# Patient Record
Sex: Male | Born: 1960 | Race: Black or African American | Hispanic: No | Marital: Single | Smoking: Former smoker
Health system: Southern US, Community
[De-identification: ages and names within clinical notes are randomized; demographics above are authoritative.]

## PROBLEM LIST (undated history)

## (undated) ENCOUNTER — Ambulatory Visit

## (undated) DIAGNOSIS — J42 Unspecified chronic bronchitis: Secondary | ICD-10-CM

## (undated) DIAGNOSIS — F32A Depression, unspecified: Secondary | ICD-10-CM

## (undated) DIAGNOSIS — I10 Essential (primary) hypertension: Secondary | ICD-10-CM

## (undated) DIAGNOSIS — K219 Gastro-esophageal reflux disease without esophagitis: Secondary | ICD-10-CM

## (undated) DIAGNOSIS — M1990 Unspecified osteoarthritis, unspecified site: Secondary | ICD-10-CM

## (undated) DIAGNOSIS — R7303 Prediabetes: Secondary | ICD-10-CM

## (undated) DIAGNOSIS — Z9989 Dependence on other enabling machines and devices: Secondary | ICD-10-CM

## (undated) DIAGNOSIS — G4733 Obstructive sleep apnea (adult) (pediatric): Secondary | ICD-10-CM

## (undated) DIAGNOSIS — F419 Anxiety disorder, unspecified: Secondary | ICD-10-CM

## (undated) DIAGNOSIS — F329 Major depressive disorder, single episode, unspecified: Secondary | ICD-10-CM

## (undated) DIAGNOSIS — M109 Gout, unspecified: Secondary | ICD-10-CM

## (undated) DIAGNOSIS — J449 Chronic obstructive pulmonary disease, unspecified: Secondary | ICD-10-CM

## (undated) HISTORY — PX: NO PAST SURGERIES: SHX2092

---

## 2006-03-20 ENCOUNTER — Emergency Department (HOSPITAL_COMMUNITY): Admission: EM | Admit: 2006-03-20 | Discharge: 2006-03-20 | Payer: Self-pay | Admitting: Family Medicine

## 2006-04-07 ENCOUNTER — Ambulatory Visit: Payer: Self-pay | Admitting: *Deleted

## 2006-04-07 ENCOUNTER — Ambulatory Visit: Payer: Self-pay | Admitting: Nurse Practitioner

## 2006-04-23 ENCOUNTER — Ambulatory Visit: Payer: Self-pay | Admitting: Nurse Practitioner

## 2006-05-14 ENCOUNTER — Ambulatory Visit (HOSPITAL_BASED_OUTPATIENT_CLINIC_OR_DEPARTMENT_OTHER): Admission: RE | Admit: 2006-05-14 | Discharge: 2006-05-14 | Payer: Self-pay | Admitting: Nurse Practitioner

## 2006-05-17 ENCOUNTER — Ambulatory Visit: Payer: Self-pay | Admitting: Internal Medicine

## 2006-06-02 ENCOUNTER — Ambulatory Visit: Payer: Self-pay | Admitting: Nurse Practitioner

## 2006-08-14 ENCOUNTER — Ambulatory Visit: Payer: Self-pay | Admitting: Nurse Practitioner

## 2006-08-15 ENCOUNTER — Ambulatory Visit (HOSPITAL_COMMUNITY): Admission: RE | Admit: 2006-08-15 | Discharge: 2006-08-15 | Payer: Self-pay | Admitting: Nurse Practitioner

## 2006-08-25 ENCOUNTER — Ambulatory Visit: Payer: Self-pay | Admitting: Nurse Practitioner

## 2006-10-23 ENCOUNTER — Ambulatory Visit: Payer: Self-pay | Admitting: Nurse Practitioner

## 2006-10-27 ENCOUNTER — Ambulatory Visit: Payer: Self-pay | Admitting: Nurse Practitioner

## 2007-03-25 ENCOUNTER — Encounter (INDEPENDENT_AMBULATORY_CARE_PROVIDER_SITE_OTHER): Payer: Self-pay | Admitting: *Deleted

## 2008-06-15 ENCOUNTER — Emergency Department (HOSPITAL_COMMUNITY): Admission: EM | Admit: 2008-06-15 | Discharge: 2008-06-15 | Payer: Self-pay | Admitting: Emergency Medicine

## 2008-10-18 ENCOUNTER — Ambulatory Visit: Payer: Self-pay | Admitting: Internal Medicine

## 2008-10-19 ENCOUNTER — Encounter: Payer: Self-pay | Admitting: Internal Medicine

## 2008-10-19 ENCOUNTER — Ambulatory Visit: Payer: Self-pay | Admitting: Vascular Surgery

## 2008-10-19 ENCOUNTER — Ambulatory Visit (HOSPITAL_COMMUNITY): Admission: RE | Admit: 2008-10-19 | Discharge: 2008-10-19 | Payer: Self-pay | Admitting: Internal Medicine

## 2008-10-27 ENCOUNTER — Ambulatory Visit: Payer: Self-pay | Admitting: Internal Medicine

## 2008-12-12 ENCOUNTER — Ambulatory Visit: Payer: Self-pay | Admitting: Internal Medicine

## 2009-08-03 ENCOUNTER — Ambulatory Visit: Payer: Self-pay | Admitting: Internal Medicine

## 2009-08-09 ENCOUNTER — Ambulatory Visit: Payer: Self-pay | Admitting: Internal Medicine

## 2009-08-09 ENCOUNTER — Encounter (INDEPENDENT_AMBULATORY_CARE_PROVIDER_SITE_OTHER): Payer: Self-pay | Admitting: Family Medicine

## 2009-08-09 LAB — CONVERTED CEMR LAB
ALT: 30 units/L (ref 0–53)
AST: 22 units/L (ref 0–37)
BUN: 10 mg/dL (ref 6–23)
Basophils Absolute: 0 10*3/uL (ref 0.0–0.1)
Basophils Relative: 0 % (ref 0–1)
Chloride: 102 meq/L (ref 96–112)
Eosinophils Relative: 2 % (ref 0–5)
Glucose, Bld: 97 mg/dL (ref 70–99)
HCT: 45.7 % (ref 39.0–52.0)
LDL Cholesterol: 57 mg/dL (ref 0–99)
Lymphocytes Relative: 23 % (ref 12–46)
MCHC: 32.6 g/dL (ref 30.0–36.0)
MCV: 91 fL (ref 78.0–100.0)
Monocytes Absolute: 0.9 10*3/uL (ref 0.1–1.0)
Monocytes Relative: 11 % (ref 3–12)
Neutro Abs: 5 10*3/uL (ref 1.7–7.7)
Neutrophils Relative %: 64 % (ref 43–77)
Potassium: 4.5 meq/L (ref 3.5–5.3)
RBC: 5.02 M/uL (ref 4.22–5.81)
RDW: 15 % (ref 11.5–15.5)
Total Protein: 7.2 g/dL (ref 6.0–8.3)
Vit D, 25-Hydroxy: 22 ng/mL — ABNORMAL LOW (ref 30–89)

## 2009-09-01 ENCOUNTER — Ambulatory Visit: Payer: Self-pay | Admitting: Internal Medicine

## 2009-09-21 ENCOUNTER — Emergency Department (HOSPITAL_COMMUNITY): Admission: EM | Admit: 2009-09-21 | Discharge: 2009-09-21 | Payer: Self-pay | Admitting: Family Medicine

## 2009-10-19 ENCOUNTER — Ambulatory Visit: Payer: Self-pay | Admitting: Internal Medicine

## 2009-10-19 LAB — CONVERTED CEMR LAB: CRP: 0.3 mg/dL (ref <0.6)

## 2009-11-30 ENCOUNTER — Ambulatory Visit: Payer: Self-pay | Admitting: Internal Medicine

## 2010-07-29 ENCOUNTER — Encounter: Payer: Self-pay | Admitting: Family Medicine

## 2010-11-23 NOTE — Procedures (Signed)
NAME:  Kurt [name], Kurt [name] NO.:  [id number]   MEDICAL RECORD NO.:  [id number]          PATIENT TYPE:  OUT   LOCATION:  SLEEP CENTER                 FACILITY:  [facility]   PHYSICIAN:  Clinton D. Maple Hudson, MD, FCCP, FACPDATE OF BIRTH:  07-21-1961   DATE OF STUDY:  05/14/2006                              NOCTURNAL POLYSOMNOGRAM   REFERRING PHYSICIAN:  Duke Salvia   INDICATIONS FOR STUDY:  Hypersomnia with sleep apnea. Epworth sleepiness  score 22/24, BMI 50, weight 320 pounds.   HOME MEDICATIONS:  Trazodone, Cymbalta, Invega, Naproxen, potassium,  furosemide.   Sleep Architecture:  Total sleep time 446 minutes with sleep efficiency 96%.  Stage I was 3%, stage II 83%, stages III and IV were absent. REM 14% of  total sleep time. Sleep latency 4 minutes, REM latency 287 minutes, awake  after sleep onset 16 minutes, arousable index 61 indicating markedly  increased sleep fragmentation. The patient complained of burning, itching  and pain in back and legs. He took trazodone and Invega at 8:30 p.m.   Respiratory Data:  Split study protocol. Apnea-hypopnea index (AHI, RDI)  165.2 obstructive events per hour indicating severe sleep apnea/hypopnea  syndrome before CPAP. This included 340 obstructive apneas and 11 hypopneas  before CPAP. All sleep and events were while supine. REM AHI 12.5. CPAP was  titrated to 25 CWP, AHI 0 per hour. This will require a bilevel machine for  the pressure. A medium ResMed Quattro full face mask was used with a heated  humidifier.   Oxygen Data:  Very loud snoring with oxygen desaturations to a nadir of 61%  before CPAP control. After CPAP saturation held 85-91% on room air.   Cardiac Data:  Sinus rhythm, occasional PVC and PAC.   Movement/Parasomnia:  Occasional leg jerk, insignificant.   IMPRESSION AND RECOMMENDATIONS:  1. Very severe obstructive sleep apnea/hypopnea syndrome. Apnea-hypopnea      index 165.2 obstructive events per hour with all  sleep and events while      supine. Very loud snoring, oxygen desaturation to 61%.  2. Successful continuous positive airway pressure titration. Required 25      CWP. This will require a bilevel machine for delivery although      continuous positive airway pressure (CPAP) pressure setting was used. A      medium ResMed Quattro full face mask was used with a heated humidifier.      The home care company will      likely need to work with pressure relief technologies and he may need      reduced pressure on exhalation (consider 25/15) to be comfortable.      Clinton D. Maple Hudson, MD, FCCP, FACP  Diplomate, Biomedical engineer of Sleep Medicine  Electronically Signed     CDY/MEDQ  D:  05/17/2006 12:00:11  T:  05/17/2006 15:48:19  Job:  [id number]

## 2011-01-02 ENCOUNTER — Inpatient Hospital Stay (INDEPENDENT_AMBULATORY_CARE_PROVIDER_SITE_OTHER)
Admission: RE | Admit: 2011-01-02 | Discharge: 2011-01-02 | Disposition: A | Discharge status: Home / Self Care | Payer: Self-pay | Source: Ambulatory Visit | Attending: Emergency Medicine | Admitting: Emergency Medicine

## 2011-01-02 DIAGNOSIS — R109 Unspecified abdominal pain: Secondary | ICD-10-CM

## 2011-01-02 LAB — POCT URINALYSIS DIP (DEVICE)
Bilirubin Urine: NEGATIVE
Glucose, UA: NEGATIVE mg/dL
Hgb urine dipstick: NEGATIVE
Nitrite: NEGATIVE
Urobilinogen, UA: 0.2 mg/dL (ref 0.0–1.0)
pH: 6 (ref 5.0–8.0)

## 2011-11-06 ENCOUNTER — Encounter (HOSPITAL_COMMUNITY): Payer: Self-pay | Admitting: *Deleted

## 2011-11-06 ENCOUNTER — Emergency Department (INDEPENDENT_AMBULATORY_CARE_PROVIDER_SITE_OTHER): Payer: Self-pay

## 2011-11-06 ENCOUNTER — Emergency Department (INDEPENDENT_AMBULATORY_CARE_PROVIDER_SITE_OTHER)
Admission: EM | Admit: 2011-11-06 | Discharge: 2011-11-06 | Disposition: A | Discharge status: Home / Self Care | Payer: Self-pay | Source: Home / Self Care | Attending: Emergency Medicine | Admitting: Emergency Medicine

## 2011-11-06 DIAGNOSIS — S6390XA Sprain of unspecified part of unspecified wrist and hand, initial encounter: Secondary | ICD-10-CM

## 2011-11-06 DIAGNOSIS — S63602A Unspecified sprain of left thumb, initial encounter: Secondary | ICD-10-CM

## 2011-11-06 HISTORY — DX: Essential (primary) hypertension: I10

## 2011-11-06 NOTE — ED Provider Notes (Signed)
Chief Complaint  Patient presents with  . Hand Injury    History of Present Illness:   Kurt [name] is a 51 year old male who crushed his left thumb under a table that he was moving a week ago. He has swelling of the thumb and pain over the inter phalangeal joint. He is able to move it fairly well with slight stiffness and pain on movement. There is no numbness or tingling. He denies any bruising or deformity in. Has no pain over the MCP joint or over the hand.  Review of Systems:  Other than noted above, the patient denies any of the following symptoms: Systemic:  No fevers, chills, sweats, or aches.  No fatigue or tiredness. Musculoskeletal:  No joint pain, arthritis, bursitis, swelling, back pain, or neck pain. Neurological:  No muscular weakness, paresthesias, headache, or trouble with speech or coordination.  No dizziness.   PMFSH:  Past medical history, family history, social history, meds, and allergies were reviewed.  Physical Exam:   Vital signs:  BP 162/92  Pulse 92  Temp(Src) 98.6 F (37 C) (Oral)  Resp 18  SpO2 100% Gen:  Alert and oriented times 3.  In no distress. Musculoskeletal: The thumb looks a little bit swollen, but no bruising or deformity. There is pain to palpation over the IP joint. No pain over the distal phalanx, proximal phalanx, or MCP joint. He is able to fully extend and can fully flex although with some pain. Sensation is intact. Otherwise, all joints had a full a ROM with no swelling, bruising or deformity.  No edema, pulses full. Extremities were warm and pink.  Capillary refill was brisk.  Skin:  Clear, warm and dry.  No rash. Neuro:  Alert and oriented times 3.  Muscle strength was normal.  Sensation was intact to light touch.   Radiology:  Dg Finger Thumb Left  11/06/2011  *RADIOLOGY REPORT*  Clinical Data: Pain proximal right thumb, dropped a table onto left thumb 1 week ago  LEFT THUMB 2+V  Comparison: None  Findings: Soft tissue swelling left thumb.  Osseous mineralization normal. Joint spaces preserved. No acute fracture, dislocation, or bone destruction.  IMPRESSION: No acute osseous abnormalities.  Original Report Authenticated By: Lollie Marrow, M.D.   Course in Urgent Care Center:   He was placed in a position of function finger splint and should wear this for the next month, then start doing range of motion exercises and if no better after 2 months return to his primary care physician or to come back here for recheck.  Assessment:  The encounter diagnosis was Sprain of left thumb.  Plan:   1.  The following meds were prescribed:   New Prescriptions   No medications on file   2.  The patient was instructed in symptomatic care, including rest and activity, elevation, application of ice and compression.  Appropriate handouts were given. 3.  The patient was told to return if becoming worse in any way, if no better in 3 or 4 days, and given some red flag symptoms that would indicate earlier return.   4.  The patient was told to follow up with a primary care physician if no better in 2 months.  Reuben Likes, MD 11/06/11 [phone]

## 2011-11-06 NOTE — Discharge Instructions (Signed)
Finger Sprain A finger sprain is a tear in one of the strong, fibrous tissues that connect the bones (ligaments) in your finger. The severity of the sprain depends on how much of the ligament is torn. The tear can be either partial or complete. CAUSES  Often, sprains are a result of a fall or accident. If you extend your hands to catch an object or to protect yourself, the force of the impact causes the fibers of your ligament to stretch too much. This excess tension causes the fibers of your ligament to tear. SYMPTOMS  You may have some loss of motion in your finger. Other symptoms include:  Bruising.   Tenderness.   Swelling.  DIAGNOSIS  In order to diagnose finger sprain, your caregiver will physically examine your finger or thumb to determine how torn the ligament is. Your caregiver may also suggest an X-ray exam of your finger to make sure no bones are broken. TREATMENT  If your ligament is only partially torn, treatment usually involves keeping the finger in a fixed position (immobilization) for a short period. To do this, your caregiver will apply a bandage, cast, or splint to keep your finger from moving until it heals. For a partially torn ligament, the healing process usually takes 2 to 3 weeks. If your ligament is completely torn, you may need surgery to reconnect the ligament to the bone. After surgery a cast or splint will be applied and will need to stay on your finger or thumb for 4 to 6 weeks while your ligament heals. HOME CARE INSTRUCTIONS  Keep your injured finger elevated, when possible, to decrease swelling.   To ease pain and swelling, apply ice to your joint twice a day, for 2 to 3 days:   Put ice in a plastic bag.   Place a towel between your skin and the bag.   Leave the ice on for 15 minutes.   Only take over-the-counter or prescription medicine for pain as directed by your caregiver.   Do not wear rings on your injured finger.   Do not leave your finger  unprotected until pain and stiffness go away (usually 3 to 4 weeks).   Do not allow your cast or splint to get wet. Cover your cast or splint with a plastic bag when you shower or bathe. Do not swim.   Your caregiver may suggest special exercises for you to do during your recovery to prevent or limit permanent stiffness.  SEEK IMMEDIATE MEDICAL CARE IF:  Your cast or splint becomes damaged.   Your pain becomes worse rather than better.  MAKE SURE YOU:  Understand these instructions.   Will watch your condition.   Will get help right away if you are not doing well or get worse.  Document Released: 08/01/2004 Document Revised: 06/13/2011 Document Reviewed: 02/25/2011 [facility] Patient Information 2012 [place], [place].  Wear splint for 1 month, then start range of motion exercise (10 time twice daily).  If no better in 2 months, return for a recheck.

## 2011-11-06 NOTE — ED Notes (Signed)
Pt  Reports  1  Week  Ago  He  Was  Moving a  Table  When  He  Slipped      And  The  Table  Felled  On his  l  Thumb    -  He  Has  Pain / swelling     Present       rom is  Limited

## 2014-01-19 ENCOUNTER — Emergency Department (HOSPITAL_COMMUNITY)
Admission: EM | Admit: 2014-01-19 | Discharge: 2014-01-19 | Disposition: A | Discharge status: Home / Self Care | Payer: Self-pay | Source: Home / Self Care | Attending: Family Medicine | Admitting: Family Medicine

## 2014-01-19 ENCOUNTER — Encounter (HOSPITAL_COMMUNITY): Payer: Self-pay | Admitting: Emergency Medicine

## 2014-01-19 DIAGNOSIS — I10 Essential (primary) hypertension: Secondary | ICD-10-CM

## 2014-01-19 DIAGNOSIS — J029 Acute pharyngitis, unspecified: Secondary | ICD-10-CM

## 2014-01-19 LAB — POCT I-STAT, CHEM 8
BUN: 9 mg/dL (ref 6–23)
CALCIUM ION: 1.14 mmol/L (ref 1.12–1.23)
CHLORIDE: 100 meq/L (ref 96–112)
CREATININE: 1.1 mg/dL (ref 0.50–1.35)
Glucose, Bld: 93 mg/dL (ref 70–99)
HCT: 56 % — ABNORMAL HIGH (ref 39.0–52.0)
HEMOGLOBIN: 19 g/dL — AB (ref 13.0–17.0)
Potassium: 4.2 mEq/L (ref 3.7–5.3)
Sodium: 140 mEq/L (ref 137–147)
TCO2: 31 mmol/L (ref 0–100)

## 2014-01-19 LAB — POCT RAPID STREP A: STREPTOCOCCUS, GROUP A SCREEN (DIRECT): NEGATIVE

## 2014-01-19 MED ORDER — LISINOPRIL-HYDROCHLOROTHIAZIDE 10-12.5 MG PO TABS: 1.0000 | ORAL_TABLET | Freq: Every day | ORAL | Status: DC

## 2014-01-19 MED ORDER — PREDNISONE 10 MG PO TABS: 30.0000 mg | ORAL_TABLET | Freq: Every day | ORAL | Status: DC

## 2014-01-19 NOTE — Discharge Instructions (Signed)
Thank you for coming in today. Use prednisone daily for sore throat.  Restart hypertension.  Call or go to the emergency room if you get worse, have trouble breathing, have chest pains, or palpitations.    [facility], [facility]iatric Psychiatry and   [address] [place], [place] [address] [phone]  [facility] [address] [place], [place] [address] [phone]  Please call or see Ms Antionette CharMaggy Mena for assistance with your bill.  You may qualify for reduced or free services.  Her phone number is [phone]. Her email is [email or web address]  Pharyngitis Pharyngitis is redness, pain, and swelling (inflammation) of your pharynx.  CAUSES  Pharyngitis is usually caused by infection. Most of the time, these infections are from viruses (viral) and are part of a cold. However, sometimes pharyngitis is caused by bacteria (bacterial). Pharyngitis can also be caused by allergies. Viral pharyngitis may be spread from person to person by coughing, sneezing, and personal items or utensils (cups, forks, spoons, toothbrushes). Bacterial pharyngitis may be spread from person to person by more intimate contact, such as kissing.  SIGNS AND SYMPTOMS  Symptoms of pharyngitis include:   Sore throat.   Tiredness (fatigue).   Low-grade fever.   Headache.  Joint pain and muscle aches.  Skin rashes.  Swollen lymph nodes.  Plaque-like film on throat or tonsils (often seen with bacterial pharyngitis). DIAGNOSIS  Your health care provider will ask you questions about your illness and your symptoms. Your medical history, along with a physical exam, is often all that is needed to diagnose pharyngitis. Sometimes, a rapid strep test is done. Other lab tests may also be done, depending on the suspected cause.  TREATMENT  Viral pharyngitis will usually get better in 3-4 days without the use of medicine. Bacterial  pharyngitis is treated with medicines that kill germs (antibiotics).  HOME CARE INSTRUCTIONS   Drink enough water and fluids to keep your urine clear or pale yellow.   Only take over-the-counter or prescription medicines as directed by your health care provider:   If you are prescribed antibiotics, make sure you finish them even if you start to feel better.   Do not take aspirin.   Get lots of rest.   Gargle with 8 oz of salt water ( tsp of salt per 1 qt of water) as often as every 1-2 hours to soothe your throat.   Throat lozenges (if you are not at risk for choking) or sprays may be used to soothe your throat. SEEK MEDICAL CARE IF:   You have large, tender lumps in your neck.  You have a rash.  You cough up green, yellow-brown, or bloody spit. SEEK IMMEDIATE MEDICAL CARE IF:   Your neck becomes stiff.  You drool or are unable to swallow liquids.  You vomit or are unable to keep medicines or liquids down.  You have severe pain that does not go away with the use of recommended medicines.  You have trouble breathing (not caused by a stuffy nose). MAKE SURE YOU:   Understand these instructions.  Will watch your condition.  Will get help right away if you are not doing well or get worse. Document Released: 06/24/2005 Document Revised: 04/14/2013 Document Reviewed: 03/01/2013 [facility] Patient Information 2015 [place]. This information is not intended to replace advice given to you by your health care provider. Make sure you discuss any questions you have with your health care provider.  Hypertension Hypertension, commonly called  high blood pressure, is when the force of blood pumping through your arteries is too strong. Your arteries are the blood vessels that carry blood from your heart throughout your body. A blood pressure reading consists of a higher number over a lower number, such as 110/72. The higher number (systolic) is the pressure inside your  arteries when your heart pumps. The lower number (diastolic) is the pressure inside your arteries when your heart relaxes. Ideally you want your blood pressure below 120/80. Hypertension forces your heart to work harder to pump blood. Your arteries may become narrow or stiff. Having hypertension puts you at risk for heart disease, stroke, and other problems.  RISK FACTORS Some risk factors for high blood pressure are controllable. Others are not.  Risk factors you cannot control include:   Race. You may be at higher risk if you are African American.  Age. Risk increases with age.  Gender. Men are at higher risk than women before age 69 years. After age 55, women are at higher risk than men. Risk factors you can control include:  Not getting enough exercise or physical activity.  Being overweight.  Getting too much fat, sugar, calories, or salt in your diet.  Drinking too much alcohol. SIGNS AND SYMPTOMS Hypertension does not usually cause signs or symptoms. Extremely high blood pressure (hypertensive crisis) may cause headache, anxiety, shortness of breath, and nosebleed. DIAGNOSIS  To check if you have hypertension, your health care provider will measure your blood pressure while you are seated, with your arm held at the level of your heart. It should be measured at least twice using the same arm. Certain conditions can cause a difference in blood pressure between your right and left arms. A blood pressure reading that is higher than normal on one occasion does not mean that you need treatment. If one blood pressure reading is high, ask your health care provider about having it checked again. TREATMENT  Treating high blood pressure includes making lifestyle changes and possibly taking medication. Living a healthy lifestyle can help lower high blood pressure. You may need to change some of your habits. Lifestyle changes may include:  Following the DASH diet. This diet is high in fruits,  vegetables, and whole grains. It is low in salt, red meat, and added sugars.  Getting at least 2 1/2 hours of brisk physical activity every week.  Losing weight if necessary.  Not smoking.  Limiting alcoholic beverages.  Learning ways to reduce stress. If lifestyle changes are not enough to get your blood pressure under control, your health care provider may prescribe medicine. You may need to take more than one. Work closely with your health care provider to understand the risks and benefits. HOME CARE INSTRUCTIONS  Have your blood pressure rechecked as directed by your health care provider.   Only take medicine as directed by your health care provider. Follow the directions carefully. Blood pressure medicines must be taken as prescribed. The medicine does not work as well when you skip doses. Skipping doses also puts you at risk for problems.   Do not smoke.   Monitor your blood pressure at home as directed by your health care provider. SEEK MEDICAL CARE IF:   You think you are having a reaction to medicines taken.  You have recurrent headaches or feel dizzy.  You have swelling in your ankles.  You have trouble with your vision. SEEK IMMEDIATE MEDICAL CARE IF:  You develop a severe headache or confusion.  You  have unusual weakness, numbness, or feel faint.  You have severe chest or abdominal pain.  You vomit repeatedly.  You have trouble breathing. MAKE SURE YOU:   Understand these instructions.  Will watch your condition.  Will get help right away if you are not doing well or get worse. Document Released: 06/24/2005 Document Revised: 06/29/2013 Document Reviewed: 04/16/2013 [facility] Patient Information 2015 [place], [place]. This information is not intended to replace advice given to you by your health care provider. Make sure you discuss any questions you have with your health care provider.

## 2014-01-19 NOTE — ED Notes (Signed)
C/o sore throat and left ear discomfort States he has a hx of having ear infections when he was younger States ear is discomfort pain otc meds was taking as tx

## 2014-01-19 NOTE — ED Provider Notes (Signed)
Kurt [name] is a 53 y.o. male who presents to Urgent Care today for sore throat and left ear pain. Symptoms have been present for about one week. The pain is worse with swallowing and is mild. He additionally notes a runny nose and cough. He denies any significant fevers chills nausea vomiting diarrhea or shortness of breath. History aspirin and Alka-Seltzer which helped a little.  Patient is without his blood pressure medication for several months. He does not have a doctor or health insurance. He was previously taking lisinopril hydrochlorothiazide combination pill. He does have a history of sleep apnea and is using his CPAP. No chest pain or palpitations.   Past Medical History  Diagnosis Date  . Hypertension    History  Substance Use Topics  . Smoking status: Never Smoker   . Smokeless tobacco: Not on file  . Alcohol Use: No   ROS as above Medications: No current facility-administered medications for this encounter.   Current Outpatient Prescriptions  Medication Sig Dispense Refill  . lisinopril-hydrochlorothiazide (PRINZIDE,ZESTORETIC) 10-12.5 MG per tablet Take 1 tablet by mouth daily.  30 tablet  1  . predniSONE (DELTASONE) 10 MG tablet Take 3 tablets (30 mg total) by mouth daily.  15 tablet  0    Exam:  BP 165/100  Pulse 88  Temp(Src) 98.5 F (36.9 C) (Oral)  Resp 16  SpO2 97% Gen: Well NAD morbidly obese HEENT: EOMI,  MMM unable to visualize posterior pharynx due to body habitus. Tympanic membranes are normal appearing bilaterally. No significant cervical lymphadenopathy. Lungs: Normal work of breathing. CTABL Heart: RRR no MRG Abd: NABS, Soft. NT, ND Exts: Brisk capillary refill, warm and well perfused.   Results for orders placed during the hospital encounter of 01/19/14 (from the past 24 hour(s))  POCT RAPID STREP A ([facility]RG CARE ONLY)     Status: None   Collection Time    01/19/14 12:27 PM      Result Value Ref Range   Streptococcus, Group A Screen (Direct)  NEGATIVE  NEGATIVE  POCT I-STAT, CHEM 8     Status: Abnormal   Collection Time    01/19/14 12:29 PM      Result Value Ref Range   Sodium 140  137 - 147 mEq/L   Potassium 4.2  3.7 - 5.3 mEq/L   Chloride 100  96 - 112 mEq/L   BUN 9  6 - 23 mg/dL   Creatinine, Ser 7.821.10  0.50 - 1.35 mg/dL   Glucose, Bld 93  70 - 99 mg/dL   Calcium, Ion 9.561.14  2.131.12 - 1.23 mmol/L   TCO2 31  0 - 100 mmol/L   Hemoglobin 19.0 (*) 13.0 - 17.0 g/dL   HCT 08.656.0 (*) 57.839.0 - 46.952.0 %   No results found.  Assessment and Plan: 53 y.o. male with  1) sore throat and ear pain: Viral pharyngitis due to upper respiratory virus. Plan to treat with prednisone as patient is moderately symptomatic. Will use low-dose and short duration.  2) hypertension: Refill lisinopril hydrochlorothiazide  Discussed warning signs or symptoms. Please see discharge instructions. Patient expresses understanding.    Rodolph BongEvan S Danna Sewell, MD 01/19/14 [phone]

## 2014-01-21 LAB — CULTURE, GROUP A STREP

## 2014-01-26 ENCOUNTER — Encounter (HOSPITAL_COMMUNITY): Payer: Self-pay | Admitting: Emergency Medicine

## 2014-01-26 ENCOUNTER — Emergency Department (INDEPENDENT_AMBULATORY_CARE_PROVIDER_SITE_OTHER)
Admission: EM | Admit: 2014-01-26 | Discharge: 2014-01-26 | Disposition: A | Discharge status: Home / Self Care | Payer: Self-pay | Source: Home / Self Care | Attending: Family Medicine | Admitting: Family Medicine

## 2014-01-26 DIAGNOSIS — H6980 Other specified disorders of Eustachian tube, unspecified ear: Secondary | ICD-10-CM

## 2014-01-26 DIAGNOSIS — H6982 Other specified disorders of Eustachian tube, left ear: Secondary | ICD-10-CM

## 2014-01-26 MED ORDER — IPRATROPIUM BROMIDE 0.06 % NA SOLN: 2.0000 | Freq: Four times a day (QID) | NASAL | Status: DC

## 2014-01-26 MED ORDER — CETIRIZINE HCL 10 MG PO TABS: 10.0000 mg | ORAL_TABLET | Freq: Every day | ORAL | Status: DC

## 2014-01-26 NOTE — Discharge Instructions (Signed)
Drink plenty of fluids as discussed, use medicine as prescribed, and mucinex or delsym for cough. Return or see your doctor if further problems °

## 2014-01-26 NOTE — ED Provider Notes (Signed)
CSN: [id number]     Arrival date & time 01/26/14  1610 History   First MD Initiated Contact with Patient 01/26/14 1704     Chief Complaint  Patient presents with  . Otalgia   (Consider location/radiation/quality/duration/timing/severity/associated sxs/prior Treatment) Patient is a 53 y.o. male presenting with ear pain. The history is provided by the patient.  Otalgia Location:  Left Behind ear:  No abnormality Quality:  Dull Severity:  Mild Onset quality:  Gradual Duration:  6 days Chronicity:  New Context comment:  Seen 7/15 with viral uri , left ear cong continues. Associated symptoms: congestion and rhinorrhea   Associated symptoms: no ear discharge, no sore throat and no tinnitus     Past Medical History  Diagnosis Date  . Hypertension    History reviewed. No pertinent past surgical history. History reviewed. No pertinent family history. History  Substance Use Topics  . Smoking status: Never Smoker   . Smokeless tobacco: Not on file  . Alcohol Use: No    Review of Systems  Constitutional: Negative.   HENT: Positive for congestion, ear pain and rhinorrhea. Negative for ear discharge, postnasal drip, sore throat and tinnitus.   Eyes: Negative.   Respiratory: Negative.     Allergies  Penicillins  Home Medications   Prior to Admission medications   Medication Sig Start Date End Date Taking? Authorizing Provider  cetirizine (ZYRTEC) 10 MG tablet Take 1 tablet (10 mg total) by mouth daily. 01/26/14   Linna HoffJames D Nyliah Nierenberg, MD  ipratropium (ATROVENT) 0.06 % nasal spray Place 2 sprays into both nostrils 4 (four) times daily. 01/26/14   Linna HoffJames D Dwana Garin, MD  lisinopril-hydrochlorothiazide (PRINZIDE,ZESTORETIC) 10-12.5 MG per tablet Take 1 tablet by mouth daily. 01/19/14   Rodolph BongEvan S Corey, MD  predniSONE (DELTASONE) 10 MG tablet Take 3 tablets (30 mg total) by mouth daily. 01/19/14   Rodolph BongEvan S Corey, MD   BP 145/95  Pulse 70  Temp(Src) 98.2 F (36.8 C) (Oral)  Resp 14  SpO2  100% Physical Exam  Nursing note and vitals reviewed. HENT:  Right Ear: Hearing, tympanic membrane, external ear and ear canal normal.  Left Ear: Hearing, external ear and ear canal normal. No drainage or tenderness. Tympanic membrane is injected and retracted.  No middle ear effusion.  Mouth/Throat: Oropharynx is clear and moist.  Eyes: Conjunctivae are normal. Pupils are equal, round, and reactive to light.  Neck: Normal range of motion. Neck supple.  Pulmonary/Chest: Effort normal and breath sounds normal.    ED Course  Procedures (including critical care time) Labs Review Labs Reviewed - No data to display  Imaging Review No results found.   MDM   1. Eustachian tube dysfunction, left        Linna HoffJames D Landra Howze, MD 01/26/14 [phone]

## 2014-01-26 NOTE — ED Notes (Signed)
Pt  Reports  l  Earache  For  sev  Weeks         Pt  Was  Seen  1  Week  Ago  For      Throat  Infection     Pt  Was  Slightly  Congested           Throat       Is  Better      But  l  Ear  Has  A  Discomfort

## 2017-06-08 ENCOUNTER — Encounter (HOSPITAL_COMMUNITY): Payer: Self-pay | Admitting: *Deleted

## 2017-06-08 ENCOUNTER — Other Ambulatory Visit: Payer: Self-pay

## 2017-06-08 ENCOUNTER — Ambulatory Visit (HOSPITAL_COMMUNITY)
Admission: EM | Admit: 2017-06-08 | Discharge: 2017-06-08 | Disposition: A | Discharge status: Home / Self Care | Payer: Medicaid Other

## 2017-06-08 DIAGNOSIS — J019 Acute sinusitis, unspecified: Secondary | ICD-10-CM

## 2017-06-08 DIAGNOSIS — Z760 Encounter for issue of repeat prescription: Secondary | ICD-10-CM

## 2017-06-08 MED ORDER — AZITHROMYCIN 250 MG PO TABS: 250.0000 mg | ORAL_TABLET | Freq: Every day | ORAL | 0 refills | Status: DC

## 2017-06-08 MED ORDER — LISINOPRIL-HYDROCHLOROTHIAZIDE 10-12.5 MG PO TABS: 1.0000 | ORAL_TABLET | Freq: Every day | ORAL | 1 refills | Status: DC

## 2017-06-08 MED ORDER — PREDNISONE 10 MG PO TABS: 40.0000 mg | ORAL_TABLET | Freq: Every day | ORAL | 0 refills | Status: AC

## 2017-06-08 NOTE — ED Notes (Addendum)
Patient has an appt with provider this Wednesday..  Patient is out of medication.  Recently received medicaid.  Last medicines were on [facility] from [facility]

## 2017-06-08 NOTE — ED Provider Notes (Signed)
[facility]    CSN: [id number] Arrival date & time: 06/08/17  1357     History   Chief Complaint Chief Complaint  Patient presents with  . Facial Pain    HPI Orlena Sheldonlton Chio is a 56 y.o. male.   56 year-old male, presenting today due to nasal congestion He states that he has had green nasal congestion and facial pressure for the past month or so. He states he is here today because his daughter made him come.  Daughter also mentions that the patient had a few seconds of chest pain 3 days ago. He states that he burped a few times and the chest pain subsided. He states that the pain was secondary to gas. No pain currently.  He is also requesting refill of his lisinopril   The history is provided by the patient.  URI  Presenting symptoms: congestion and facial pain   Presenting symptoms: no cough, no ear pain, no fever and no sore throat   Severity:  Moderate Onset quality:  Gradual Duration:  1 month Timing:  Constant Progression:  Unchanged Chronicity:  New Relieved by:  Nothing Worsened by:  Nothing Ineffective treatments:  None tried Associated symptoms: sinus pain   Associated symptoms: no arthralgias, no headaches, no myalgias, no neck pain, no sneezing, no swollen glands and no wheezing   Risk factors: not elderly, no chronic cardiac disease, no chronic kidney disease, no chronic respiratory disease and no diabetes mellitus     Past Medical History:  Diagnosis Date  . Hypertension     There are no active problems to display for this patient.   History reviewed. No pertinent surgical history.     Home Medications    Prior to Admission medications   Medication Sig Start Date End Date Taking? Authorizing Provider  aspirin 325 MG tablet Take 325 mg by mouth daily.   Yes [provider]  azithromycin (ZITHROMAX) 250 MG tablet Take 1 tablet (250 mg total) by mouth daily. Take first 2 tablets together, then 1 every day until finished. 06/08/17    Cordell Guercio C, PA-C  cetirizine (ZYRTEC) 10 MG tablet Take 1 tablet (10 mg total) by mouth daily. 01/26/14   Linna HoffKindl, James D, MD  ipratropium (ATROVENT) 0.06 % nasal spray Place 2 sprays into both nostrils 4 (four) times daily. 01/26/14   Linna HoffKindl, James D, MD  lisinopril-hydrochlorothiazide (PRINZIDE,ZESTORETIC) 10-12.5 MG tablet Take 1 tablet by mouth daily. 06/08/17   Ermina Oberman C, PA-C  predniSONE (DELTASONE) 10 MG tablet Take 4 tablets (40 mg total) by mouth daily for 5 days. 06/08/17 06/13/17  Alecia LemmingBlue, Norissa Bartee C, PA-C    Family History Family History  Problem Relation Age of Onset  . Hypertension Mother     Social History Social History   Tobacco Use  . Smoking status: Never Smoker  . Smokeless tobacco: Never Used  Substance Use Topics  . Alcohol use: No  . Drug use: Not on file     Allergies   Penicillins   Review of Systems Review of Systems  Constitutional: Negative for chills and fever.  HENT: Positive for congestion and sinus pain. Negative for ear pain, sneezing and sore throat.   Eyes: Negative for pain and visual disturbance.  Respiratory: Negative for cough, shortness of breath and wheezing.   Cardiovascular: Positive for chest pain (3 days ago, none currently ). Negative for palpitations.  Gastrointestinal: Negative for abdominal pain and vomiting.  Genitourinary: Negative for dysuria and hematuria.  Musculoskeletal:  Negative for arthralgias, back pain, myalgias and neck pain.  Skin: Negative for color change and rash.  Neurological: Negative for seizures, syncope and headaches.  All other systems reviewed and are negative.    Physical Exam Triage Vital Signs ED Triage Vitals  Enc Vitals Group     BP 06/08/17 1540 132/75     Pulse Rate 06/08/17 1540 97     Resp --      Temp 06/08/17 1540 98.2 F (36.8 C)     Temp Source 06/08/17 1540 Oral     SpO2 06/08/17 1540 100 %     Weight --      Height --      Head Circumference --      Peak Flow --      Pain  Score 06/08/17 1536 5     Pain Loc --      Pain Edu? --      Excl. in GC? --    No data found.  Updated Vital Signs BP 132/75 (BP Location: Left Arm)   Pulse 97   Temp 98.2 F (36.8 C) (Oral)   SpO2 100%   Visual Acuity Right Eye Distance:   Left Eye Distance:   Bilateral Distance:    Right Eye Near:   Left Eye Near:    Bilateral Near:     Physical Exam  Constitutional: He appears well-developed and well-nourished.  HENT:  Head: Normocephalic and atraumatic.  Right Ear: Hearing, tympanic membrane, external ear and ear canal normal.  Left Ear: Hearing, tympanic membrane, external ear and ear canal normal.  Nose: Right sinus exhibits maxillary sinus tenderness. Left sinus exhibits maxillary sinus tenderness.  Mouth/Throat: Uvula is midline and oropharynx is clear and moist. No oropharyngeal exudate, posterior oropharyngeal edema, posterior oropharyngeal erythema or tonsillar abscesses.  Eyes: Conjunctivae are normal.  Neck: Neck supple.  Cardiovascular: Normal rate and regular rhythm.  No murmur heard. Pulmonary/Chest: Effort normal and breath sounds normal. No stridor. No respiratory distress. He has no decreased breath sounds. He has no wheezes. He has no rhonchi. He has no rales.  Abdominal: Soft. There is no tenderness.  Musculoskeletal: He exhibits no edema.  Neurological: He is alert.  Skin: Skin is warm and dry.  Psychiatric: He has a normal mood and affect.  Nursing note and vitals reviewed.    UC Treatments / Results  Labs (all labs ordered are listed, but only abnormal results are displayed) Labs Reviewed - No data to display  EKG  EKG Interpretation None       Radiology No results found.  Procedures Procedures (including critical care time)  Medications Ordered in UC Medications - No data to display   Initial Impression / Assessment and Plan / UC Course  I have reviewed the triage vital signs and the nursing notes.  Pertinent labs &  imaging results that were available during my care of the patient were reviewed by me and considered in my medical decision making (see chart for details).     Acute sinusitis - PCN allergy, will treat with zpak and prednisone Med refill of lisinopril Few seconds of chest pain 3 days ago, none since - EKG shows NSR. No old EKGs to compare   Final Clinical Impressions(s) / UC Diagnoses   Final diagnoses:  Acute sinusitis, recurrence not specified, unspecified location  Medication refill    ED Discharge Orders        Ordered    lisinopril-hydrochlorothiazide (PRINZIDE,ZESTORETIC) 10-12.5 MG tablet  Daily  06/08/17 1603    azithromycin (ZITHROMAX) 250 MG tablet  Daily     06/08/17 1603    predniSONE (DELTASONE) 10 MG tablet  Daily     06/08/17 1603       Controlled Substance Prescriptions [place] Controlled Substance Registry consulted? Not Applicable   Alecia LemmingBlue, Jeimy Bickert C, [place]PA-C 06/08/17 1644

## 2017-06-08 NOTE — ED Triage Notes (Signed)
Per pt he's been having sinus congestion issues, sob, per pt mucus is green, per pt he's having a "little" chest pain,

## 2017-06-20 ENCOUNTER — Encounter (HOSPITAL_COMMUNITY): Payer: Self-pay

## 2017-06-20 ENCOUNTER — Other Ambulatory Visit: Payer: Self-pay

## 2017-06-20 ENCOUNTER — Inpatient Hospital Stay (HOSPITAL_COMMUNITY)
Admission: EM | Admit: 2017-06-20 | Discharge: 2017-06-22 | DRG: 190 | Disposition: A | Discharge status: Home / Self Care | Payer: Medicaid Other | Attending: Nephrology | Admitting: Nephrology

## 2017-06-20 ENCOUNTER — Emergency Department (HOSPITAL_COMMUNITY): Payer: Medicaid Other

## 2017-06-20 ENCOUNTER — Encounter (HOSPITAL_COMMUNITY): Payer: Self-pay | Admitting: Emergency Medicine

## 2017-06-20 ENCOUNTER — Ambulatory Visit (HOSPITAL_COMMUNITY)
Admission: EM | Admit: 2017-06-20 | Discharge: 2017-06-20 | Disposition: A | Discharge status: Home / Self Care | Payer: Medicaid Other | Source: Home / Self Care

## 2017-06-20 DIAGNOSIS — J9601 Acute respiratory failure with hypoxia: Secondary | ICD-10-CM

## 2017-06-20 DIAGNOSIS — Z7982 Long term (current) use of aspirin: Secondary | ICD-10-CM

## 2017-06-20 DIAGNOSIS — E119 Type 2 diabetes mellitus without complications: Secondary | ICD-10-CM | POA: Diagnosis not present

## 2017-06-20 DIAGNOSIS — H109 Unspecified conjunctivitis: Secondary | ICD-10-CM | POA: Diagnosis present

## 2017-06-20 DIAGNOSIS — Z880 Allergy status to penicillin: Secondary | ICD-10-CM

## 2017-06-20 DIAGNOSIS — I10 Essential (primary) hypertension: Secondary | ICD-10-CM | POA: Diagnosis not present

## 2017-06-20 DIAGNOSIS — R600 Localized edema: Secondary | ICD-10-CM | POA: Diagnosis present

## 2017-06-20 DIAGNOSIS — H5789 Other specified disorders of eye and adnexa: Secondary | ICD-10-CM

## 2017-06-20 DIAGNOSIS — J9621 Acute and chronic respiratory failure with hypoxia: Secondary | ICD-10-CM | POA: Diagnosis present

## 2017-06-20 DIAGNOSIS — Z79899 Other long term (current) drug therapy: Secondary | ICD-10-CM

## 2017-06-20 DIAGNOSIS — Z8249 Family history of ischemic heart disease and other diseases of the circulatory system: Secondary | ICD-10-CM

## 2017-06-20 DIAGNOSIS — M1990 Unspecified osteoarthritis, unspecified site: Secondary | ICD-10-CM | POA: Diagnosis present

## 2017-06-20 DIAGNOSIS — Z6841 Body Mass Index (BMI) 40.0 and over, adult: Secondary | ICD-10-CM

## 2017-06-20 DIAGNOSIS — J441 Chronic obstructive pulmonary disease with (acute) exacerbation: Principal | ICD-10-CM | POA: Diagnosis present

## 2017-06-20 DIAGNOSIS — G4733 Obstructive sleep apnea (adult) (pediatric): Secondary | ICD-10-CM | POA: Diagnosis present

## 2017-06-20 DIAGNOSIS — M109 Gout, unspecified: Secondary | ICD-10-CM | POA: Diagnosis present

## 2017-06-20 DIAGNOSIS — G4730 Sleep apnea, unspecified: Secondary | ICD-10-CM | POA: Diagnosis not present

## 2017-06-20 DIAGNOSIS — K219 Gastro-esophageal reflux disease without esophagitis: Secondary | ICD-10-CM | POA: Diagnosis present

## 2017-06-20 DIAGNOSIS — Z9989 Dependence on other enabling machines and devices: Secondary | ICD-10-CM

## 2017-06-20 DIAGNOSIS — Z87891 Personal history of nicotine dependence: Secondary | ICD-10-CM

## 2017-06-20 HISTORY — DX: Gout, unspecified: M10.9

## 2017-06-20 HISTORY — DX: Depression, unspecified: F32.A

## 2017-06-20 HISTORY — DX: Prediabetes: R73.03

## 2017-06-20 HISTORY — DX: Obstructive sleep apnea (adult) (pediatric): G47.33

## 2017-06-20 HISTORY — DX: Gastro-esophageal reflux disease without esophagitis: K21.9

## 2017-06-20 HISTORY — DX: Dependence on other enabling machines and devices: Z99.89

## 2017-06-20 HISTORY — DX: Anxiety disorder, unspecified: F41.9

## 2017-06-20 HISTORY — DX: Unspecified chronic bronchitis: J42

## 2017-06-20 HISTORY — DX: Major depressive disorder, single episode, unspecified: F32.9

## 2017-06-20 HISTORY — DX: Unspecified osteoarthritis, unspecified site: M19.90

## 2017-06-20 HISTORY — DX: Chronic obstructive pulmonary disease, unspecified: J44.9

## 2017-06-20 LAB — BRAIN NATRIURETIC PEPTIDE: B NATRIURETIC PEPTIDE 5: 43.1 pg/mL (ref 0.0–100.0)

## 2017-06-20 LAB — CBC WITH DIFFERENTIAL/PLATELET
BASOS ABS: 0 10*3/uL (ref 0.0–0.1)
BASOS PCT: 0 %
Eosinophils Absolute: 0.2 10*3/uL (ref 0.0–0.7)
Eosinophils Relative: 2 %
HEMATOCRIT: 57.8 % — AB (ref 39.0–52.0)
Hemoglobin: 17.4 g/dL — ABNORMAL HIGH (ref 13.0–17.0)
Lymphocytes Relative: 21 %
Lymphs Abs: 1.8 10*3/uL (ref 0.7–4.0)
MCH: 27.9 pg (ref 26.0–34.0)
MCHC: 30.1 g/dL (ref 30.0–36.0)
MCV: 92.6 fL (ref 78.0–100.0)
MONO ABS: 1.1 10*3/uL — AB (ref 0.1–1.0)
Monocytes Relative: 13 %
NEUTROS ABS: 5.6 10*3/uL (ref 1.7–7.7)
Neutrophils Relative %: 64 %
PLATELETS: 174 10*3/uL (ref 150–400)
RBC: 6.24 MIL/uL — ABNORMAL HIGH (ref 4.22–5.81)
RDW: 16.5 % — AB (ref 11.5–15.5)
WBC: 8.7 10*3/uL (ref 4.0–10.5)

## 2017-06-20 LAB — BASIC METABOLIC PANEL
ANION GAP: 8 (ref 5–15)
BUN: 10 mg/dL (ref 6–20)
CALCIUM: 8.7 mg/dL — AB (ref 8.9–10.3)
CO2: 36 mmol/L — ABNORMAL HIGH (ref 22–32)
Chloride: 93 mmol/L — ABNORMAL LOW (ref 101–111)
Creatinine, Ser: 1.1 mg/dL (ref 0.61–1.24)
GLUCOSE: 75 mg/dL (ref 65–99)
Potassium: 4.5 mmol/L (ref 3.5–5.1)
SODIUM: 137 mmol/L (ref 135–145)

## 2017-06-20 LAB — I-STAT TROPONIN, ED: Troponin i, poc: 0 ng/mL (ref 0.00–0.08)

## 2017-06-20 LAB — GLUCOSE, CAPILLARY: Glucose-Capillary: 194 mg/dL — ABNORMAL HIGH (ref 65–99)

## 2017-06-20 MED ORDER — LISINOPRIL 10 MG PO TABS
10.0000 mg | ORAL_TABLET | Freq: Every day | ORAL | Status: DC
  Administered 2017-06-21 – 2017-06-22 (×2): 10 mg via ORAL
  Filled 2017-06-20 (×2): qty 1

## 2017-06-20 MED ORDER — FUROSEMIDE 10 MG/ML IJ SOLN
40.0000 mg | Freq: Once | INTRAMUSCULAR | Status: AC
  Administered 2017-06-20: 40 mg via INTRAVENOUS
  Filled 2017-06-20: qty 4

## 2017-06-20 MED ORDER — AZITHROMYCIN 250 MG PO TABS
250.0000 mg | ORAL_TABLET | Freq: Every day | ORAL | Status: DC
  Administered 2017-06-21 – 2017-06-22 (×2): 250 mg via ORAL
  Filled 2017-06-20 (×2): qty 1

## 2017-06-20 MED ORDER — FUROSEMIDE 40 MG PO TABS
40.0000 mg | ORAL_TABLET | Freq: Every day | ORAL | Status: DC
  Administered 2017-06-21 – 2017-06-22 (×2): 40 mg via ORAL
  Filled 2017-06-20 (×2): qty 1

## 2017-06-20 MED ORDER — METHYLPREDNISOLONE SODIUM SUCC 125 MG IJ SOLR
125.0000 mg | Freq: Once | INTRAMUSCULAR | Status: AC
  Administered 2017-06-20: 125 mg via INTRAVENOUS
  Filled 2017-06-20: qty 2

## 2017-06-20 MED ORDER — ACETAMINOPHEN 325 MG PO TABS: 650.0000 mg | ORAL_TABLET | Freq: Four times a day (QID) | ORAL | Status: DC | PRN

## 2017-06-20 MED ORDER — HYDRALAZINE HCL 20 MG/ML IJ SOLN: 5.0000 mg | INTRAMUSCULAR | Status: DC | PRN

## 2017-06-20 MED ORDER — FUROSEMIDE 20 MG PO TABS: 40.0000 mg | ORAL_TABLET | Freq: Every day | ORAL | Status: DC

## 2017-06-20 MED ORDER — NAPHAZOLINE-PHENIRAMINE 0.025-0.3 % OP SOLN
1.0000 [drp] | Freq: Four times a day (QID) | OPHTHALMIC | Status: DC | PRN
  Filled 2017-06-20: qty 5

## 2017-06-20 MED ORDER — METHYLPREDNISOLONE SODIUM SUCC 125 MG IJ SOLR
60.0000 mg | Freq: Two times a day (BID) | INTRAMUSCULAR | Status: DC
  Administered 2017-06-21 – 2017-06-22 (×3): 60 mg via INTRAVENOUS
  Filled 2017-06-20 (×3): qty 2

## 2017-06-20 MED ORDER — ASPIRIN EC 325 MG PO TBEC
325.0000 mg | DELAYED_RELEASE_TABLET | Freq: Every day | ORAL | Status: DC
  Administered 2017-06-21 – 2017-06-22 (×3): 325 mg via ORAL
  Filled 2017-06-20 (×3): qty 1

## 2017-06-20 MED ORDER — DM-GUAIFENESIN ER 30-600 MG PO TB12: 1.0000 | ORAL_TABLET | Freq: Two times a day (BID) | ORAL | Status: DC | PRN

## 2017-06-20 MED ORDER — LISINOPRIL-HYDROCHLOROTHIAZIDE 10-12.5 MG PO TABS: 1.0000 | ORAL_TABLET | Freq: Every day | ORAL | Status: DC

## 2017-06-20 MED ORDER — ENOXAPARIN SODIUM 40 MG/0.4ML ~~LOC~~ SOLN
40.0000 mg | SUBCUTANEOUS | Status: DC
  Administered 2017-06-21 (×2): 40 mg via SUBCUTANEOUS
  Filled 2017-06-20 (×2): qty 0.4

## 2017-06-20 MED ORDER — FLUORESCEIN SODIUM 1 MG OP STRP
2.0000 | ORAL_STRIP | Freq: Once | OPHTHALMIC | Status: AC
  Administered 2017-06-20: 2 via OPHTHALMIC
  Filled 2017-06-20: qty 2

## 2017-06-20 MED ORDER — LORATADINE 10 MG PO TABS: 10.0000 mg | ORAL_TABLET | Freq: Every day | ORAL | Status: DC

## 2017-06-20 MED ORDER — TETRACAINE HCL 0.5 % OP SOLN
2.0000 [drp] | Freq: Once | OPHTHALMIC | Status: AC
  Administered 2017-06-20: 2 [drp] via OPHTHALMIC
  Filled 2017-06-20: qty 4

## 2017-06-20 MED ORDER — ONDANSETRON HCL 4 MG/2ML IJ SOLN: 4.0000 mg | Freq: Three times a day (TID) | INTRAMUSCULAR | Status: DC | PRN

## 2017-06-20 MED ORDER — IPRATROPIUM-ALBUTEROL 0.5-2.5 (3) MG/3ML IN SOLN
3.0000 mL | RESPIRATORY_TRACT | Status: DC
  Administered 2017-06-21 (×5): 3 mL via RESPIRATORY_TRACT
  Filled 2017-06-20 (×6): qty 3

## 2017-06-20 MED ORDER — HYDROCHLOROTHIAZIDE 12.5 MG PO CAPS
12.5000 mg | ORAL_CAPSULE | Freq: Every day | ORAL | Status: DC
  Administered 2017-06-21 – 2017-06-22 (×2): 12.5 mg via ORAL
  Filled 2017-06-20 (×3): qty 1

## 2017-06-20 MED ORDER — ZOLPIDEM TARTRATE 5 MG PO TABS: 5.0000 mg | ORAL_TABLET | Freq: Every evening | ORAL | Status: DC | PRN

## 2017-06-20 MED ORDER — ALBUTEROL SULFATE (2.5 MG/3ML) 0.083% IN NEBU
2.5000 mg | INHALATION_SOLUTION | RESPIRATORY_TRACT | Status: DC | PRN
  Filled 2017-06-20: qty 3

## 2017-06-20 MED ORDER — AZITHROMYCIN 250 MG PO TABS
500.0000 mg | ORAL_TABLET | Freq: Every day | ORAL | Status: AC
  Administered 2017-06-20: 500 mg via ORAL
  Filled 2017-06-20: qty 2

## 2017-06-20 MED ORDER — IPRATROPIUM-ALBUTEROL 0.5-2.5 (3) MG/3ML IN SOLN
3.0000 mL | Freq: Once | RESPIRATORY_TRACT | Status: AC
  Administered 2017-06-20: 3 mL via RESPIRATORY_TRACT
  Filled 2017-06-20: qty 3

## 2017-06-20 MED ORDER — KETOTIFEN FUMARATE 0.025 % OP SOLN
1.0000 [drp] | Freq: Two times a day (BID) | OPHTHALMIC | Status: DC
  Administered 2017-06-21 – 2017-06-22 (×4): 1 [drp] via OPHTHALMIC
  Filled 2017-06-20: qty 5

## 2017-06-20 NOTE — H&P (Signed)
History and Physical    Kurt Sheldonlton Mickle ZOX:[id number] DOB: 01/02/1961 DOA: 06/20/2017  Referring MD/NP/PA:   PCP: [facility]   Patient coming from:  The patient is coming from home.  At baseline, pt is independent for most of ADL.   Chief Complaint: watery eyes, cough and SOB  HPI: Kurt [name] is a 56 y.o. male with medical history significant of hypertension, diet-controlled diabetes, OSA not on CPAP, morbid obesity, former smoker, who presents with watery eyes, cough and SOB.  Pt states that he has been having eye problem for more than 2 weeks. His eyes are watery, itch and red. No pus draining. No vision change or eye pain. He notes that when he wakes up in the morning both of his eyes are crusted shut. Throughout the day he feels there is "mucousy discharge". [facility] but found to have oxygen desaturation to 85% on room air. Patient states that he has been having cough with yellow colored sputum production and SOB. Patient's shortness breath is aggravated by exertion. No chest pain or fever. Denies long distant traveling. No tenderness in the calf areas. He has bilateral leg edema recently. Patient denies nausea, vomiting, diarrhea, abdominal pain, symptoms of UTI. Patient states that he had a sleep study and was diagnosed as OSA, but did not start using BiPAP yet. Pt has been without a PCP in 7 years since being in jail.  ED Course: pt was found to have WBC 8.7, negative troponin, BNP 43.1, electrolytes renal function okay, temperature normal, heart rate in 90s, tachypnea, chest x-rays negative for infiltration, but showed right hemidiaphragm elevation. Patient is placed on telemetry bed for observation.  EDP did eye examination: per his note:  "Appearance. Bilateral conjunctival and sclera injection.  No scleral icterus. No discharge. PEERL intact. EOMI without nystagmus. No photophobia or consensual photophobia.  Corneal Abrasion Exam VCO. Risks, benefits  and alternatives explained. 1 drops of tetracaine (PONTOCAINE) 0.5 % ophthalmic solution  were applied to the both eyes. Fluorescein 1 MG ophthalmic strip applied the the surface of the both eye Wood's lamp used to screen for abrasion. No increased fluorescein uptake. No corneal ulcer. Negative Seidel sign. No foreign bodies noted. No visible hyphema.  Eye flushed with sterile saline Patient tolerated the procedure well"  Review of Systems:   General: no fevers, chills, has fatigue HEENT: no blurry vision, hearing changes or sore throat. Has watery eye. Respiratory: has dyspnea, coughing, wheezing CV: no chest pain, no palpitations GI: no nausea, vomiting, abdominal pain, diarrhea, constipation GU: no dysuria, burning on urination, increased urinary frequency, hematuria  Ext: has leg edema Neuro: no unilateral weakness, numbness, or tingling, no vision change or hearing loss Skin: no rash, no skin tear. MSK: No muscle spasm, no deformity, no limitation of range of movement in spin Heme: No easy bruising.  Travel history: No recent long distant travel.  Allergy:  Allergies  Allergen Reactions  . Penicillins Itching    Has patient had a PCN reaction causing immediate rash, facial/tongue/throat swelling, SOB or lightheadedness with hypotension: No Has patient had a PCN reaction causing severe rash involving mucus membranes or skin necrosis: No Has patient had a PCN reaction that required hospitalization: No Has patient had a PCN reaction occurring within the last 10 years: No If all of the above answers are "NO", then may proceed with Cephalosporin use.    Past Medical History:  Diagnosis Date  . Anxiety   . Arthritis    "  hands, knees, joints, lower back" (06/20/2017)  . Borderline diabetes   . Chronic bronchitis (HCC)   . COPD (chronic obstructive pulmonary disease) (HCC)    dx'd 06/20/2017  . Depression   . GERD (gastroesophageal reflux disease)   . Gout   . Hypertension    . OSA on CPAP     Past Surgical History:  Procedure Laterality Date  . NO PAST SURGERIES      Social History:  reports that he quit smoking about 10 years ago. His smoking use included cigarettes. He has a 30.00 pack-year smoking history. he has never used smokeless tobacco. He reports that he does not drink alcohol or use drugs.  Family History:  Family History  Problem Relation Age of Onset  . Hypertension Mother      Prior to Admission medications   Medication Sig Start Date End Date Taking? Authorizing Provider  albuterol (PROAIR HFA) 108 (90 Base) MCG/ACT inhaler Inhale 2 puffs into the lungs 4 (four) times daily as needed for wheezing or shortness of breath.   Yes [provider]  aspirin EC 325 MG tablet Take 325 mg by mouth daily.   Yes [provider]  furosemide (LASIX) 40 MG tablet Take 40 mg by mouth daily.   Yes [provider]  lisinopril-hydrochlorothiazide (PRINZIDE,ZESTORETIC) 10-12.5 MG tablet Take 1 tablet by mouth daily. 06/08/17  Yes Blue, Olivia C, PA-C  naphazoline-pheniramine (NAPHCON-A) 0.025-0.3 % ophthalmic solution Place 1 drop into both eyes 4 (four) times daily as needed for eye irritation or allergies.    Yes [provider]  potassium chloride (K-DUR) 10 MEQ tablet Take 10 mEq by mouth daily. 06/11/17  Yes [provider]  cetirizine (ZYRTEC) 10 MG tablet Take 1 tablet (10 mg total) by mouth daily. Patient not taking: Reported on 06/20/2017 01/26/14   Linna Hoff, MD  ipratropium (ATROVENT) 0.06 % nasal spray Place 2 sprays into both nostrils 4 (four) times daily. Patient not taking: Reported on 06/20/2017 01/26/14   Linna Hoff, MD  Vitamin D, Ergocalciferol, (DRISDOL) 50000 units CAPS capsule Take 50,000 Units by mouth once a week. 06/18/17   [provider]    Physical Exam: Vitals:   06/20/17 1538 06/20/17 1609 06/20/17 1930 06/20/17 2221  BP:  126/76 112/68 130/78  Pulse:  97 86 91    Resp:  18 (!) 23 18  Temp:    98.6 F (37 C)  TempSrc:    Oral  SpO2:  96% 94% 94%  Weight: (!) 151.5 kg (334 lb)   (!) 138.3 kg (305 lb)  Height: 5\' 7"  (1.702 m)   5\' 7"  (1.702 m)   General: Not in acute distress HEENT:       Eyes: PERRL, EOMI, no scleral icterus. Both eyes are red and watery. No vision loss.       ENT: No discharge from the ears and nose, no pharynx injection, no tonsillar enlargement.        Neck: Difficult to assess JVD due to morbid obesity , no bruit, no mass felt. Heme: No neck lymph node enlargement. Cardiac: S1/S2, RRR, No murmurs, No gallops or rubs. Respiratory: currently no rales, wheezing, rhonchi or rubs. Per EDP, pt had wheezing at arrival to ED. GI: Soft, nondistended, nontender, no rebound pain, no organomegaly, BS present. GU: No hematuria Ext: 2+ pitting leg edema bilaterally. 2+DP/PT pulse bilaterally. Musculoskeletal: No joint deformities, No joint redness or warmth, no limitation of ROM in spin. Skin: No rashes.  Neuro: Alert, oriented X3, cranial nerves II-XII grossly intact, moves all extremities normally.  Psych: Patient is not psychotic, no suicidal or hemocidal ideation.  Labs on Admission: I have personally reviewed following labs and imaging studies  CBC: Recent Labs  Lab 06/20/17 1743  WBC 8.7  NEUTROABS 5.6  HGB 17.4*  HCT 57.8*  MCV 92.6  PLT 174   Basic Metabolic Panel: Recent Labs  Lab 06/20/17 1743  NA 137  K 4.5  CL 93*  CO2 36*  GLUCOSE 75  BUN 10  CREATININE 1.10  CALCIUM 8.7*   GFR: Estimated Creatinine Clearance: 102 mL/min (by C-G formula based on SCr of 1.1 mg/dL). Liver Function Tests: No results for input(s): AST, ALT, ALKPHOS, BILITOT, PROT, ALBUMIN in the last 168 hours. No results for input(s): LIPASE, AMYLASE in the last 168 hours. No results for input(s): AMMONIA in the last 168 hours. Coagulation Profile: No results for input(s): INR, PROTIME in the last 168 hours. Cardiac Enzymes: No  results for input(s): CKTOTAL, CKMB, CKMBINDEX, TROPONINI in the last 168 hours. BNP (last 3 results) No results for input(s): PROBNP in the last 8760 hours. HbA1C: No results for input(s): HGBA1C in the last 72 hours. CBG: Recent Labs  Lab 06/20/17 2352  GLUCAP 194*   Lipid Profile: No results for input(s): CHOL, HDL, LDLCALC, TRIG, CHOLHDL, LDLDIRECT in the last 72 hours. Thyroid Function Tests: No results for input(s): TSH, T4TOTAL, FREET4, T3FREE, THYROIDAB in the last 72 hours. Anemia Panel: No results for input(s): VITAMINB12, FOLATE, FERRITIN, TIBC, IRON, RETICCTPCT in the last 72 hours. Urine analysis:    Component Value Date/Time   LABSPEC 1.020 01/02/2011 1242   PHURINE 6.0 01/02/2011 1242   GLUCOSEU NEGATIVE 01/02/2011 1242   HGBUR NEGATIVE 01/02/2011 1242   BILIRUBINUR NEGATIVE 01/02/2011 1242   KETONESUR NEGATIVE 01/02/2011 1242   PROTEINUR NEGATIVE 01/02/2011 1242   UROBILINOGEN 0.2 01/02/2011 1242   NITRITE NEGATIVE 01/02/2011 1242   LEUKOCYTESUR NEGATIVE 01/02/2011 1242   Sepsis Labs: @LABRCNTIP (procalcitonin:4,lacticidven:4) )No results found for this or any previous visit (from the past 240 hour(s)).   Radiological Exams on Admission: Dg Chest 2 View  Result Date: 06/20/2017 CLINICAL DATA:  Shortness of Breath EXAM: CHEST  2 VIEW COMPARISON:  None. FINDINGS: Elevation of the right hemidiaphragm with right base opacity, likely atelectasis. No confluent opacity on the left. Heart is borderline enlarged. No effusions or acute bony abnormality. IMPRESSION: Elevated right hemidiaphragm with right base atelectasis. Electronically Signed   By: Charlett NoseKevin  Dover M.D.   On: 06/20/2017 18:41     EKG: Independently reviewed. Sinus rhythm, QTC 451, LAD, mild T-wave inversion in V1-V4   Assessment/Plan Principal Problem:   Acute on chronic respiratory failure with hypoxia (HCC) Active Problems:   Sleep apnea   Hypertension   Diabetes mellitus without complication  (HCC)   Conjunctivitis   COPD with acute exacerbation (HCC)   Bilateral leg edema   Acute respiratory failure with hypoxia (HCC)   Acute on chronic respiratory failure with hypoxia: Possibly due to undiagnosed COPD with exacerbation. Patient smoked for 25 years, has productive cough and SOB. He had wheezing earlier per ED physician, clinically consistent with COPD. A potential differential diagnosis is congestive heart failure since patient has bilateral 2+ leg edema. His BNP is only 43, which could be falsely low due to morbid obesity.  -will place on tele bed for obs -Nebulizers: scheduled Duoneb and prn albuterol -Solu-Medrol 60 mg IV bid -Z pak -Mucinex for cough  -Incentive  spirometry -Urine S. pneumococcal antigen -Follow up blood culture x2, sputum culture, respiratory virus panel, Flu pcr -Nasal cannula oxygen as needed to maintain O2 saturation 92% or greater -give 40 mg of lasix by IV, and then continue oral lasix 40 mg daily -get 2d echo   Sleep apnea: -start CPAP  HTN:  -Continue home medications: Prinzide and Lasix -IV hydralazine prn  Diet controlled diabetes without complication (HCC): A1c 5.7 on 08/09/09. Well controled. CBG 75 -check CBG qAM -check A1c  Possible Conjunctivitis: -Ketotifen eye drop   DVT ppx: SQ Lovenox Code Status: Full code Family Communication:  Yes, patient's daughter and mother  at bed side Disposition Plan:  Anticipate discharge back to previous home environment Consults called:  none Admission status: Obs / tele   Date of Service 06/21/2017    Lorretta Harp [facility] Pager [phone]  If 7PM-7AM, please contact night-coverage [email or web address] Password [email or web address] 06/21/2017, 12:09 AM

## 2017-06-20 NOTE — ED Notes (Signed)
Pt reports his eyes are burning, itching and drainage. Pt reports the ye drainage is worse in the morning and eyes are sometimes matted shut.

## 2017-06-20 NOTE — Progress Notes (Signed)
RT went to place pt on CPAP pt not ready he is eating and stated he would be up all night.  PT will call when he is ready to be placed on CPAP.

## 2017-06-20 NOTE — ED Triage Notes (Signed)
Pt states he wanted to be seen for his eyes, discharge and drainage from his eye. Pt intial saturations were 65%, told to take deep breathes. Pt states he has problems breathing chronicly supposed to be on CPAP but hasn't been using it. Saturations now 89%.

## 2017-06-20 NOTE — ED Notes (Signed)
Pt breahign is shallow, states walking to and from the car he gets short of breath. Pt saturations dropped back down to 85%. Kurt [name] with patient speeaking to him, recommends he goes straight to the ER. Patient verbalized understanding and left the department and went to the ER.

## 2017-06-20 NOTE — ED Triage Notes (Signed)
Per Pt, Pt is coming from home with nasal congestion, watery itchy eyes, and discharge from eyes with no relief. Pt was given medication one week ago for sinus infection and reports the symptoms have included.

## 2017-06-20 NOTE — ED Provider Notes (Signed)
[facility] Provider Note   CSN: [id number] Arrival date & time: 06/20/17  1525     History   Chief Complaint Chief Complaint  Patient presents with  . Nasal Congestion    HPI Kurt [name] is a 56 y.o. male with a history of diabetes, hypertension and sleep apnea who presents to the emergency department today for hypoxia.  Patient was at the [facility] urgent care and noted to have shallow breaths and shortness of breath when walking to his car.  They checked his saturations and found them to be at 85%.  He is now requiring 4L O2 in the department to maintain 96%. No previous oxygen requirment.  Patient notes that he has been short of breath for the last several months.  He notes that he has sleep apnea but does not use his CPAP machine.  He notes that his shortness of breath is worse when he lies down and with exertion.  [place] but now is unable to walk more than a few hours before coming short of breath.  The patient states he is diagnosed with bronchitis every year for the last 4 years.  He has been using his albuterol inhaler as needed for shortness of breath with significant improvement.  He notes that he has been having lower extremity swelling bilaterally over the last few months additionally.  He notes that he had a productive cough, sinus pressure and URI symptoms 1 week ago for which she was seen at [facility] urgent care on 06/08/2017.  He was diagnosed with acute sinusitis and started on a Z-Pak, prednisone and had his lisinopril-HCTZ refilled.  The patient has been without a PCP in 7 years since being in jail.  He was presenting to the urgent care again today as he has been having itchy, red, watery eyes.  He notes that when he wakes up in the morning both of his eyes are crusted shut. Throughout the day he feels there is "mucousy discharge". No associated sneezing. This is been ongoing for the  last 3 days bilaterally. No sick contacts. He denies fever, HA, N/V, loss of vision, changes in vision, flashers, floaters, blurring, diplopia, photophobia, FB sensation,  trauma, rash, pain or painful EOM.   HPI  Past Medical History:  Diagnosis Date  . Diabetes mellitus without complication (HCC)    Borderline  . Hypertension   . Sleep apnea     There are no active problems to display for this patient.   History reviewed. No pertinent surgical history.     Home Medications    Prior to Admission medications   Medication Sig Start Date End Date Taking? Authorizing Provider  aspirin 325 MG tablet Take 325 mg by mouth daily.    [provider]  azithromycin (ZITHROMAX) 250 MG tablet Take 1 tablet (250 mg total) by mouth daily. Take first 2 tablets together, then 1 every day until finished. 06/08/17   Blue, Olivia C, PA-C  cetirizine (ZYRTEC) 10 MG tablet Take 1 tablet (10 mg total) by mouth daily. 01/26/14   Linna HoffKindl, James D, MD  ipratropium (ATROVENT) 0.06 % nasal spray Place 2 sprays into both nostrils 4 (four) times daily. 01/26/14   Linna HoffKindl, James D, MD  lisinopril-hydrochlorothiazide (PRINZIDE,ZESTORETIC) 10-12.5 MG tablet Take 1 tablet by mouth daily. 06/08/17   Blue, Marylene Landlivia C, PA-C    Family History Family History  Problem Relation Age of Onset  .  Hypertension Mother     Social History Social History   Tobacco Use  . Smoking status: Former Games developer  . Smokeless tobacco: Never Used  Substance Use Topics  . Alcohol use: No  . Drug use: No     Allergies   Penicillins   Review of Systems Review of Systems  All other systems reviewed and are negative.    Physical Exam Updated Vital Signs BP 126/76   Pulse 97   Temp 98.2 F (36.8 C) (Oral)   Resp 18   Ht 5\' 7"  (1.702 m)   Wt (!) 151.5 kg (334 lb)   SpO2 96%   BMI 52.31 kg/m   Physical Exam  Constitutional: He appears well-developed and well-nourished.  Resting comfortably no acute distress    HENT:  Head: Normocephalic and atraumatic.  Right Ear: Tympanic membrane and external ear normal.  Left Ear: Tympanic membrane and external ear normal.  Nose: Mucosal edema present. Right sinus exhibits no maxillary sinus tenderness and no frontal sinus tenderness. Left sinus exhibits no maxillary sinus tenderness and no frontal sinus tenderness.  Mouth/Throat: Uvula is midline, oropharynx is clear and moist and mucous membranes are normal. No tonsillar exudate.  The patient has normal phonation and is in control of secretions. No stridor.  Midline uvula without edema. Soft palate rises symmetrically.  No tonsillar erythema or exudates. No PTA. Tongue protrusion is normal. No trismus. No creptius on neck palpation and patient has good dentition. No gingival erythema or fluctuance noted. Mucus membranes moist.    Eyes: Pupils are equal, round, and reactive to light. Right eye exhibits no discharge. Left eye exhibits no discharge. No scleral icterus.  Appearance. Bilateral conjunctival and sclera injection.  No scleral icterus. No discharge.PEERL intact. EOMI without nystagmus. No photophobia or consensual photophobia.  Corneal Abrasion Exam VCO. Risks, benefits and alternatives explained. 1 drops of tetracaine (PONTOCAINE) 0.5 % ophthalmic solution  were applied to the both eyes. Fluorescein 1 MG ophthalmic strip applied the the surface of the both eye Wood's lamp used to screen for abrasion. No increased fluorescein uptake. No corneal ulcer. Negative Seidel sign. No foreign bodies noted. No visible hyphema.  Eye flushed with sterile saline Patient tolerated the procedure well  Neck: Trachea normal. Neck supple. No spinous process tenderness present. Carotid bruit is not present. No neck rigidity. Normal range of motion present.  No meningismus.   Cardiovascular: Normal rate, regular rhythm and intact distal pulses.  No murmur heard. Pulses:      Radial pulses are 2+ on the right side, and 2+  on the left side.       Dorsalis pedis pulses are 2+ on the right side, and 2+ on the left side.       Posterior tibial pulses are 2+ on the right side, and 2+ on the left side.  1-2+ b/l lower extremity edema to level of mid shin.   Pulmonary/Chest: Effort normal. No respiratory distress. He has wheezes (Faint, end expiratory). He has rales in the right lower field and the left lower field. He exhibits no tenderness.  Nasal cannula with 4 L O2.  Abdominal: Soft. Bowel sounds are normal. There is no tenderness. There is no rebound and no guarding.  Musculoskeletal: He exhibits no edema.  Lymphadenopathy:       Head (right side): No preauricular adenopathy present.       Head (left side): No preauricular adenopathy present.    He has no cervical adenopathy.  Neurological: He is  alert.  Skin: Skin is warm and dry. No rash noted. He is not diaphoretic.  Psychiatric: He has a normal mood and affect.  Nursing note and vitals reviewed.  ED Treatments / Results  Labs (all labs ordered are listed, but only abnormal results are displayed) Labs Reviewed  BASIC METABOLIC PANEL - Abnormal; Notable for the following components:      Result Value   Chloride 93 (*)    CO2 36 (*)    Calcium 8.7 (*)    All other components within normal limits  CBC WITH DIFFERENTIAL/PLATELET - Abnormal; Notable for the following components:   RBC 6.24 (*)    Hemoglobin 17.4 (*)    HCT 57.8 (*)    RDW 16.5 (*)    Monocytes Absolute 1.1 (*)    All other components within normal limits  GLUCOSE, CAPILLARY - Abnormal; Notable for the following components:   Glucose-Capillary 194 (*)    All other components within normal limits  RESPIRATORY PANEL BY PCR  CULTURE, BLOOD (ROUTINE X 2)  CULTURE, BLOOD (ROUTINE X 2)  CULTURE, EXPECTORATED SPUTUM-ASSESSMENT  GRAM STAIN  BRAIN NATRIURETIC PEPTIDE  INFLUENZA PANEL BY PCR (TYPE A & B)  HEMOGLOBIN A1C  HIV ANTIBODY (ROUTINE TESTING)  STREP PNEUMONIAE URINARY ANTIGEN    I-STAT TROPONIN, ED    EKG  EKG Interpretation None       Radiology Dg Chest 2 View  Result Date: 06/20/2017 CLINICAL DATA:  Shortness of Breath EXAM: CHEST  2 VIEW COMPARISON:  None. FINDINGS: Elevation of the right hemidiaphragm with right base opacity, likely atelectasis. No confluent opacity on the left. Heart is borderline enlarged. No effusions or acute bony abnormality. IMPRESSION: Elevated right hemidiaphragm with right base atelectasis. Electronically Signed   By: Charlett Nose M.D.   On: 06/20/2017 18:41    Procedures Procedures (including critical care time)  Medications Ordered in ED Medications  ipratropium-albuterol (DUONEB) 0.5-2.5 (3) MG/3ML nebulizer solution 3 mL (not administered)  fluorescein ophthalmic strip 2 strip (not administered)  tetracaine (PONTOCAINE) 0.5 % ophthalmic solution 2 drop (not administered)     Initial Impression / Assessment and Plan / ED Course  I have reviewed the triage vital signs and the nursing notes.  Pertinent labs & imaging results that were available during my care of the patient were reviewed by me and considered in my medical decision making (see chart for details).     56 year old male with extensive smoking history presenting today with hypoxia.  He has been diagnosed with acute bronchitis each of the last 4 successive years.  On presentation the patient is without fever, tachycardia, tachypnea, hypotension.  He is on 4 L of nasal O2 with sats in mid 90s.  He is noted to have wheezing on exam that improved after nebulizer treatment.  No evidence of CHF on chest x-ray and BNP within normal limits.   Patient is requiring 4 L O2 in order to maintain SPO2 greater than 90%.  He was given IV Solu-Medrol and breathing treatment in the department with improvement of shortness of breath.. Suspect new onset COPD with oxygen requirement.  Will admit patient to hospitalist service for acute respiratory failure with hypoxia.  Patient  also noted to have eye irritation bilaterally described as itchy, watery eyes with mucousy drainage during the day, crusting in the morning. On exam there is no purulent discharge, corneal abrasions, entrapment, consensual photophobia, or dendritic staining with fluorescein study.  Presentation non-concerning for iritis, corneal abrasions, or HSV.  Suspect conjunctivitis.   Dr. Clyde LundborgNiu to admit the patient. Patient and family in agreement with plan. He appears safe for admission.   Final Clinical Impressions(s) / ED Diagnoses   Final diagnoses:  Acute respiratory failure with hypoxia [facility](HCC)  Eye irritation    ED Discharge Orders    None       Princella PellegriniMaczis, Zolton Dowson M, PA-C 06/21/17 [id number]    Raeford RazorKohut, Stephen, MD 06/24/17 1146

## 2017-06-21 ENCOUNTER — Observation Stay (HOSPITAL_BASED_OUTPATIENT_CLINIC_OR_DEPARTMENT_OTHER): Payer: Medicaid Other

## 2017-06-21 DIAGNOSIS — J441 Chronic obstructive pulmonary disease with (acute) exacerbation: Principal | ICD-10-CM

## 2017-06-21 DIAGNOSIS — R0602 Shortness of breath: Secondary | ICD-10-CM | POA: Diagnosis present

## 2017-06-21 DIAGNOSIS — Z880 Allergy status to penicillin: Secondary | ICD-10-CM | POA: Diagnosis not present

## 2017-06-21 DIAGNOSIS — Z87891 Personal history of nicotine dependence: Secondary | ICD-10-CM | POA: Diagnosis not present

## 2017-06-21 DIAGNOSIS — I10 Essential (primary) hypertension: Secondary | ICD-10-CM | POA: Diagnosis not present

## 2017-06-21 DIAGNOSIS — R0902 Hypoxemia: Secondary | ICD-10-CM | POA: Diagnosis not present

## 2017-06-21 DIAGNOSIS — J9620 Acute and chronic respiratory failure, unspecified whether with hypoxia or hypercapnia: Secondary | ICD-10-CM | POA: Diagnosis not present

## 2017-06-21 DIAGNOSIS — H109 Unspecified conjunctivitis: Secondary | ICD-10-CM | POA: Diagnosis present

## 2017-06-21 DIAGNOSIS — Z8249 Family history of ischemic heart disease and other diseases of the circulatory system: Secondary | ICD-10-CM | POA: Diagnosis not present

## 2017-06-21 DIAGNOSIS — J9621 Acute and chronic respiratory failure with hypoxia: Secondary | ICD-10-CM | POA: Diagnosis not present

## 2017-06-21 DIAGNOSIS — E119 Type 2 diabetes mellitus without complications: Secondary | ICD-10-CM | POA: Diagnosis not present

## 2017-06-21 DIAGNOSIS — M1990 Unspecified osteoarthritis, unspecified site: Secondary | ICD-10-CM | POA: Diagnosis present

## 2017-06-21 DIAGNOSIS — I509 Heart failure, unspecified: Secondary | ICD-10-CM

## 2017-06-21 DIAGNOSIS — Z7982 Long term (current) use of aspirin: Secondary | ICD-10-CM | POA: Diagnosis not present

## 2017-06-21 DIAGNOSIS — M109 Gout, unspecified: Secondary | ICD-10-CM | POA: Diagnosis present

## 2017-06-21 DIAGNOSIS — G4733 Obstructive sleep apnea (adult) (pediatric): Secondary | ICD-10-CM | POA: Diagnosis present

## 2017-06-21 DIAGNOSIS — R600 Localized edema: Secondary | ICD-10-CM | POA: Diagnosis present

## 2017-06-21 DIAGNOSIS — Z79899 Other long term (current) drug therapy: Secondary | ICD-10-CM | POA: Diagnosis not present

## 2017-06-21 DIAGNOSIS — Z6841 Body Mass Index (BMI) 40.0 and over, adult: Secondary | ICD-10-CM | POA: Diagnosis not present

## 2017-06-21 DIAGNOSIS — K219 Gastro-esophageal reflux disease without esophagitis: Secondary | ICD-10-CM | POA: Diagnosis present

## 2017-06-21 LAB — RESPIRATORY PANEL BY PCR
ADENOVIRUS-RVPPCR: NOT DETECTED
Bordetella pertussis: NOT DETECTED
CORONAVIRUS NL63-RVPPCR: NOT DETECTED
CORONAVIRUS OC43-RVPPCR: NOT DETECTED
Chlamydophila pneumoniae: NOT DETECTED
Coronavirus 229E: NOT DETECTED
Coronavirus HKU1: NOT DETECTED
INFLUENZA A-RVPPCR: NOT DETECTED
INFLUENZA B-RVPPCR: NOT DETECTED
METAPNEUMOVIRUS-RVPPCR: NOT DETECTED
MYCOPLASMA PNEUMONIAE-RVPPCR: NOT DETECTED
PARAINFLUENZA VIRUS 1-RVPPCR: NOT DETECTED
PARAINFLUENZA VIRUS 2-RVPPCR: NOT DETECTED
PARAINFLUENZA VIRUS 3-RVPPCR: NOT DETECTED
PARAINFLUENZA VIRUS 4-RVPPCR: NOT DETECTED
RESPIRATORY SYNCYTIAL VIRUS-RVPPCR: NOT DETECTED
Rhinovirus / Enterovirus: NOT DETECTED

## 2017-06-21 LAB — ECHOCARDIOGRAM COMPLETE
AOASC: 33 cm
Area-P 1/2: 4.23 cm2
CHL CUP MV DEC (S): 176
EWDT: 176 ms
FS: 27 % — AB (ref 28–44)
Height: 67 in
IVS/LV PW RATIO, ED: 0.96
LA vol A4C: 50.7 ml
LA vol index: 28.4 mL/m2
LA vol: 68.4 mL
LADIAMINDEX: 1.78 cm/m2
LASIZE: 43 mm
LEFT ATRIUM END SYS DIAM: 43 mm
LV PW d: 13.2 mm — AB (ref 0.6–1.1)
LVOT VTI: 30.7 cm
LVOT area: 3.46 cm2
LVOT peak grad rest: 11 mmHg
LVOTD: 21 mm
LVOTPV: 169 cm/s
LVOTSV: 106 mL
MV Peak grad: 7 mmHg
MV pk A vel: 155 m/s
MVPKEVEL: 130 m/s
MVSPHT: 52 ms
TAPSE: 18.6 mm
Weight: 4851.2 oz

## 2017-06-21 LAB — GLUCOSE, CAPILLARY
GLUCOSE-CAPILLARY: 155 mg/dL — AB (ref 65–99)
Glucose-Capillary: 150 mg/dL — ABNORMAL HIGH (ref 65–99)
Glucose-Capillary: 142 mg/dL — ABNORMAL HIGH (ref 65–99)
Glucose-Capillary: 114 mg/dL — ABNORMAL HIGH (ref 65–99)

## 2017-06-21 LAB — INFLUENZA PANEL BY PCR (TYPE A & B)
Influenza A By PCR: NEGATIVE
Influenza B By PCR: NEGATIVE

## 2017-06-21 LAB — STREP PNEUMONIAE URINARY ANTIGEN: Strep Pneumo Urinary Antigen: NEGATIVE

## 2017-06-21 LAB — HIV ANTIBODY (ROUTINE TESTING W REFLEX): HIV SCREEN 4TH GENERATION: NONREACTIVE

## 2017-06-21 MED ORDER — PERFLUTREN LIPID MICROSPHERE
INTRAVENOUS | Status: AC
  Administered 2017-06-21: 11:00:00
  Filled 2017-06-21: qty 10

## 2017-06-21 MED ORDER — IPRATROPIUM-ALBUTEROL 0.5-2.5 (3) MG/3ML IN SOLN
3.0000 mL | Freq: Four times a day (QID) | RESPIRATORY_TRACT | Status: DC
  Administered 2017-06-22 (×3): 3 mL via RESPIRATORY_TRACT
  Filled 2017-06-21 (×3): qty 3

## 2017-06-21 MED ORDER — PERFLUTREN LIPID MICROSPHERE
1.0000 mL | INTRAVENOUS | Status: AC | PRN
  Filled 2017-06-21: qty 10

## 2017-06-21 NOTE — Progress Notes (Signed)
  Echocardiogram 2D Echocardiogram has been performed.  Zita Ozimek G Arthella Headings 06/21/2017, 11:36 AM

## 2017-06-21 NOTE — Progress Notes (Signed)
SATURATION QUALIFICATIONS: (This note is used to comply with regulatory documentation for home oxygen)  Patient Saturations on Room Air at Rest = 79%  Patient Saturations on Room Air while Ambulating = Not able to do because stats were too low  Patient Saturations on 92% on 2L of oxygen Please briefly explain why patient needs home oxygen: Patients oxygen saturation is too low on room air.

## 2017-06-21 NOTE — Progress Notes (Addendum)
PROGRESS NOTE    Kurt [name]  ION:[id number] DOB: 01/02/1961 DOA: 06/20/2017 PCP: [facility]   Brief Narrative: 56 y.o. male with medical history significant of hypertension, diet-controlled diabetes, OSA not on CPAP, morbid obesity, former smoker, who presents with watery eyes, cough and SOB.  Assessment & Plan:   # Acute on chronic respiratory failure with hypoxia (HCC) likely due to undiagnosed COPD with acute exacerbation: -Still with cough, shortness of breath and requiring oxygen.  Currently on 3-4 L of oxygen, not at baseline.  Continue Solu-Medrol, nebulization, Mucinex. -On azithromycin -Try to wean down oxygen to room air. -Influenza, RSV negative -Follow-up echocardiogram.  # Sleep apnea: Continue CPAP  #  Hypertension: Monitor blood pressure.  Continue Lasix, lisinopril.   #Possible conjunctivitis: Improved    DVT prophylaxis: Lovenox subcutaneous Code Status: Full code Family Communication: Trying to contact patient's daughter and left a voice message. disposition Plan: Currently admitted.    Consultants:   None  Procedures: CPAP in the night Antimicrobials: Azithromycin  Subjective: Seen and examined at bedside.  Having cough, some wheezing.  Shortness of breath is better.  Denies chest pain, nausea or vomiting.  Not at baseline.  Requiring about 3-4 L of oxygen.  Objective: Vitals:   06/21/17 0830 06/21/17 0836 06/21/17 1219 06/21/17 1233  BP:  (!) 147/84  104/90  Pulse:  (!) 110  90  Resp:  20  20  Temp:  98.6 F (37 C)  98.7 F (37.1 C)  TempSrc:  Oral  Oral  SpO2: 93% 96% 96% 93%  Weight:      Height:        Intake/Output Summary (Last 24 hours) at 06/21/2017 1325 Last data filed at 06/21/2017 0820 Gross per 24 hour  Intake 0 ml  Output 1825 ml  Net -1825 ml   Filed Weights   06/20/17 1538 06/20/17 2221 06/21/17 0443  Weight: (!) 151.5 kg (334 lb) (!) 138.3 kg (305 lb) (!) 137.5 kg (303 lb 3.2 oz)     Examination:  General exam: Appears calm and comfortable  Respiratory system: Bilateral intermittent diffuse wheeze, respiratory effort normal.  Cardiovascular system: S1 & S2 heard, RRR.  No pedal edema. Gastrointestinal system: Abdomen is nondistended, soft and nontender. Normal bowel sounds heard. Central nervous system: Alert and oriented. No focal neurological deficits. Extremities: Symmetric 5 x 5 power. Skin: No rashes, lesions or ulcers Psychiatry: Judgement and insight appear normal. Mood & affect appropriate.     Data Reviewed: I have personally reviewed following labs and imaging studies  CBC: Recent Labs  Lab 06/20/17 1743  WBC 8.7  NEUTROABS 5.6  HGB 17.4*  HCT 57.8*  MCV 92.6  PLT 174   Basic Metabolic Panel: Recent Labs  Lab 06/20/17 1743  NA 137  K 4.5  CL 93*  CO2 36*  GLUCOSE 75  BUN 10  CREATININE 1.10  CALCIUM 8.7*   GFR: Estimated Creatinine Clearance: 101.6 mL/min (by C-G formula based on SCr of 1.1 mg/dL). Liver Function Tests: No results for input(s): AST, ALT, ALKPHOS, BILITOT, PROT, ALBUMIN in the last 168 hours. No results for input(s): LIPASE, AMYLASE in the last 168 hours. No results for input(s): AMMONIA in the last 168 hours. Coagulation Profile: No results for input(s): INR, PROTIME in the last 168 hours. Cardiac Enzymes: No results for input(s): CKTOTAL, CKMB, CKMBINDEX, TROPONINI in the last 168 hours. BNP (last 3 results) No results for input(s): PROBNP in the last 8760 hours. HbA1C: No results for input(s):  HGBA1C in the last 72 hours. CBG: Recent Labs  Lab 06/20/17 2352 06/21/17 0747 06/21/17 1207  GLUCAP 194* 155* 150*   Lipid Profile: No results for input(s): CHOL, HDL, LDLCALC, TRIG, CHOLHDL, LDLDIRECT in the last 72 hours. Thyroid Function Tests: No results for input(s): TSH, T4TOTAL, FREET4, T3FREE, THYROIDAB in the last 72 hours. Anemia Panel: No results for input(s): VITAMINB12, FOLATE, FERRITIN, TIBC,  IRON, RETICCTPCT in the last 72 hours. Sepsis Labs: No results for input(s): PROCALCITON, LATICACIDVEN in the last 168 hours.  Recent Results (from the past 240 hour(s))  Respiratory Panel by PCR     Status: None   Collection Time: 06/20/17  9:06 PM  Result Value Ref Range Status   Adenovirus NOT DETECTED NOT DETECTED Final   Coronavirus 229E NOT DETECTED NOT DETECTED Final   Coronavirus HKU1 NOT DETECTED NOT DETECTED Final   Coronavirus NL63 NOT DETECTED NOT DETECTED Final   Coronavirus OC43 NOT DETECTED NOT DETECTED Final   Metapneumovirus NOT DETECTED NOT DETECTED Final   Rhinovirus / Enterovirus NOT DETECTED NOT DETECTED Final   Influenza A NOT DETECTED NOT DETECTED Final   Influenza B NOT DETECTED NOT DETECTED Final   Parainfluenza Virus 1 NOT DETECTED NOT DETECTED Final   Parainfluenza Virus 2 NOT DETECTED NOT DETECTED Final   Parainfluenza Virus 3 NOT DETECTED NOT DETECTED Final   Parainfluenza Virus 4 NOT DETECTED NOT DETECTED Final   Respiratory Syncytial Virus NOT DETECTED NOT DETECTED Final   Bordetella pertussis NOT DETECTED NOT DETECTED Final   Chlamydophila pneumoniae NOT DETECTED NOT DETECTED Final   Mycoplasma pneumoniae NOT DETECTED NOT DETECTED Final         Radiology Studies: Dg Chest 2 View  Result Date: 06/20/2017 CLINICAL DATA:  Shortness of Breath EXAM: CHEST  2 VIEW COMPARISON:  None. FINDINGS: Elevation of the right hemidiaphragm with right base opacity, likely atelectasis. No confluent opacity on the left. Heart is borderline enlarged. No effusions or acute bony abnormality. IMPRESSION: Elevated right hemidiaphragm with right base atelectasis. Electronically Signed   By: Charlett NoseKevin  Dover M.D.   On: 06/20/2017 18:41        Scheduled Meds: . aspirin EC  325 mg Oral Daily  . azithromycin  250 mg Oral Daily  . enoxaparin (LOVENOX) injection  40 mg Subcutaneous Q24H  . furosemide  40 mg Oral Daily  . lisinopril  10 mg Oral Daily   And  .  hydrochlorothiazide  12.5 mg Oral Daily  . ipratropium-albuterol  3 mL Nebulization Q4H  . ketotifen  1 drop Both Eyes BID  . methylPREDNISolone sodium succinate  60 mg Intravenous Q12H   Continuous Infusions:   LOS: 0 days    Dron Jaynie CollinsPrasad Bhandari, MD [facility] Pager [phone]  If 7PM-7AM, please contact night-coverage [email or web address] Password [email or web address] 06/21/2017, 1:25 PM

## 2017-06-21 NOTE — Plan of Care (Signed)
  Education: Knowledge of General Education information will improve 06/21/2017 0824 - Completed/Met by Evert Kohl, RN

## 2017-06-21 NOTE — Progress Notes (Signed)
Patient refused bed alarm. Will continue to monitor patient. 

## 2017-06-21 NOTE — Progress Notes (Signed)
Pt stated he did not want to be awaken for tx.

## 2017-06-22 DIAGNOSIS — J9620 Acute and chronic respiratory failure, unspecified whether with hypoxia or hypercapnia: Secondary | ICD-10-CM

## 2017-06-22 DIAGNOSIS — R0902 Hypoxemia: Secondary | ICD-10-CM

## 2017-06-22 LAB — GLUCOSE, CAPILLARY
GLUCOSE-CAPILLARY: 148 mg/dL — AB (ref 65–99)
Glucose-Capillary: 129 mg/dL — ABNORMAL HIGH (ref 65–99)

## 2017-06-22 MED ORDER — PREDNISONE 50 MG PO TABS: 50.0000 mg | ORAL_TABLET | Freq: Every day | ORAL | 0 refills | Status: AC

## 2017-06-22 MED ORDER — AZITHROMYCIN 500 MG PO TABS: 500.0000 mg | ORAL_TABLET | Freq: Every day | ORAL | 0 refills | Status: AC

## 2017-06-22 MED ORDER — DM-GUAIFENESIN ER 30-600 MG PO TB12: 1.0000 | ORAL_TABLET | Freq: Two times a day (BID) | ORAL | 0 refills | Status: DC | PRN

## 2017-06-22 MED ORDER — ALBUTEROL SULFATE HFA 108 (90 BASE) MCG/ACT IN AERS: 2.0000 | INHALATION_SPRAY | Freq: Four times a day (QID) | RESPIRATORY_TRACT | 2 refills | Status: DC | PRN

## 2017-06-22 MED ORDER — ALBUTEROL SULFATE HFA 108 (90 BASE) MCG/ACT IN AERS: 2.0000 | INHALATION_SPRAY | Freq: Four times a day (QID) | RESPIRATORY_TRACT | 0 refills | Status: DC | PRN

## 2017-06-22 NOTE — Discharge Summary (Signed)
Physician Discharge Summary  Kurt [name] ZOX:[id number] DOB: 1960-08-10 DOA: 06/20/2017  PCP: [facility]  Admit date: 06/20/2017 Discharge date: 06/22/2017  Admitted From:home Disposition:home  Recommendations for Outpatient Follow-up:  1. Follow up with PCP in 1-2 weeks 2. Please obtain BMP/CBC in one week  Home Health:yes Equipment/Devices:oxygen and resume CPAP Discharge Condition:stable CODE STATUS:full code Diet recommendation:heart healthy  Brief/Interim Summary: 57 y.o.malewith medical history significant ofhypertension, diet-controlled diabetes, OSA not on CPAP, morbid obesity, former smoker, who presents with watery eyes, cough andSOB.  # Acute on chronic respiratory failure with hypoxia (HCC) likely due to undiagnosed COPD with acute exacerbation: -Treated with Solu-Medrol, nebulization, Mucinex, antibiotics.  Clinically improved however still requiring 2 L of oxygen.  He desaturated in room air and while ambulation.  Plan to discharge home with 2 L of oxygen with outpatient follow-up.  He is on CPAP for his sleep apnea.  Continue bronchodilators.  Discharging with 3 days of oral prednisone and azithromycin.  Continue Mucinex and albuterol inhaler.  I discussed with the patient and he verbalized understanding.  Echo with hyperdynamic left ventricle with mild LVH, discussed with the patient.  # Sleep apnea: Continue CPAP  #  Hypertension: Monitor blood pressure.  Continue Lasix, lisinopril.  Needs tight control of his blood pressure.  Recommend outpatient follow-up with PCP.   #Possible conjunctivitis: Improved  Patient is clinically stable on discharge.   Discharge Diagnoses:  Principal Problem:   Acute on chronic respiratory failure with hypoxia (HCC) Active Problems:   Sleep apnea   Hypertension   Diabetes mellitus without complication (HCC)   Conjunctivitis   COPD with acute exacerbation (HCC)   Bilateral leg edema   Acute respiratory failure  with hypoxia [facility])    Discharge Instructions  Discharge Instructions    Call MD for:  difficulty breathing, headache or visual disturbances   Complete by:  As directed    Call MD for:  extreme fatigue   Complete by:  As directed    Call MD for:  hives   Complete by:  As directed    Call MD for:  persistant dizziness or light-headedness   Complete by:  As directed    Call MD for:  persistant nausea and vomiting   Complete by:  As directed    Call MD for:  severe uncontrolled pain   Complete by:  As directed    Call MD for:  temperature >100.4   Complete by:  As directed    Diet - low sodium heart healthy   Complete by:  As directed    Discharge instructions   Complete by:  As directed    Please follow-up with your PCP if further evaluation of oxygen requirement.  You may need to see pulmonologist.   Increase activity slowly   Complete by:  As directed      Allergies as of 06/22/2017      Reactions   Penicillins Itching   Has patient had a PCN reaction causing immediate rash, facial/tongue/throat swelling, SOB or lightheadedness with hypotension: No Has patient had a PCN reaction causing severe rash involving mucus membranes or skin necrosis: No Has patient had a PCN reaction that required hospitalization: No Has patient had a PCN reaction occurring within the last 10 years: No If all of the above answers are "NO", then may proceed with Cephalosporin use.      Medication List    STOP taking these medications   cetirizine 10 MG tablet Commonly known as:  ZYRTEC   ipratropium 0.06 % nasal spray Commonly known as:  ATROVENT     TAKE these medications   albuterol 108 (90 Base) MCG/ACT inhaler Commonly known as:  PROVENTIL HFA;VENTOLIN HFA Inhale 2 puffs into the lungs every 6 (six) hours as needed for wheezing or shortness of breath. What changed:  You were already taking a medication with the same name, and this prescription was added. Make sure you understand how and  when to take each.   albuterol 108 (90 Base) MCG/ACT inhaler Commonly known as:  PROAIR HFA Inhale 2 puffs into the lungs 4 (four) times daily as needed for wheezing or shortness of breath. What changed:  Another medication with the same name was added. Make sure you understand how and when to take each.   aspirin EC 325 MG tablet Take 325 mg by mouth daily.   azithromycin 500 MG tablet Commonly known as:  ZITHROMAX Take 1 tablet (500 mg total) by mouth daily for 3 days.   dextromethorphan-guaiFENesin 30-600 MG 12hr tablet Commonly known as:  MUCINEX DM Take 1 tablet by mouth 2 (two) times daily as needed for cough.   furosemide 40 MG tablet Commonly known as:  LASIX Take 40 mg by mouth daily.   lisinopril-hydrochlorothiazide 10-12.5 MG tablet Commonly known as:  PRINZIDE,ZESTORETIC Take 1 tablet by mouth daily.   naphazoline-pheniramine 0.025-0.3 % ophthalmic solution Commonly known as:  NAPHCON-A Place 1 drop into both eyes 4 (four) times daily as needed for eye irritation or allergies.   potassium chloride 10 MEQ tablet Commonly known as:  K-DUR Take 10 mEq by mouth daily.   predniSONE 50 MG tablet Commonly known as:  DELTASONE Take 1 tablet (50 mg total) by mouth daily with breakfast for 3 days.   Vitamin D (Ergocalciferol) 50000 units Caps capsule Commonly known as:  DRISDOL Take 50,000 Units by mouth once a week.            Durable Medical Equipment  (From admission, onward)        Start     Ordered   06/22/17 1156  DME Oxygen  Once    Question Answer Comment  Mode or (Route) Nasal cannula   Liters per Minute 2   Frequency Continuous (stationary and portable oxygen unit needed)   Oxygen conserving device Yes   Oxygen delivery system Gas      06/22/17 1156     Follow-up Information    [facility]. Schedule an appointment as soon as possible for a visit in 1 week(s).   Contact information: Janit Pagan[address] [place]  [id number] [phone]          Allergies  Allergen Reactions  . Penicillins Itching    Has patient had a PCN reaction causing immediate rash, facial/tongue/throat swelling, SOB or lightheadedness with hypotension: No Has patient had a PCN reaction causing severe rash involving mucus membranes or skin necrosis: No Has patient had a PCN reaction that required hospitalization: No Has patient had a PCN reaction occurring within the last 10 years: No If all of the above answers are "NO", then may proceed with Cephalosporin use.    Consultations: None  Procedures/Studies: CPAP  Subjective: Seen and examined at bedside.  Reported doing well.  Denies headache, dizziness, nausea vomiting chest pain shortness of breath.  Requiring 2 L of oxygen.  Discharge Exam: Vitals:   06/22/17 0757 06/22/17 0851  BP:  (!) 113/51  Pulse:  (!) 103  Resp:  Temp:    SpO2: 90% 93%   Vitals:   06/21/17 2326 06/22/17 0516 06/22/17 0757 06/22/17 0851  BP:  115/66  (!) 113/51  Pulse:  (!) 102  (!) 103  Resp:  20    Temp:  98.7 F (37.1 C)    TempSrc:  Oral    SpO2: 99% 91% 90% 93%  Weight:  (!) 138.7 kg (305 lb 11.2 oz)    Height:        General: Pt is alert, awake, not in acute distress Cardiovascular: RRR, S1/S2 +, no rubs, no gallops Respiratory: CTA bilaterally, no wheezing, no rhonchi Abdominal: Soft, NT, ND, bowel sounds + Extremities: no edema, no cyanosis    The results of significant diagnostics from this hospitalization (including imaging, microbiology, ancillary and laboratory) are listed below for reference.     Microbiology: Recent Results (from the past 240 hour(s))  Respiratory Panel by PCR     Status: None   Collection Time: 06/20/17  9:06 PM  Result Value Ref Range Status   Adenovirus NOT DETECTED NOT DETECTED Final   Coronavirus 229E NOT DETECTED NOT DETECTED Final   Coronavirus HKU1 NOT DETECTED NOT DETECTED Final   Coronavirus NL63 NOT DETECTED NOT DETECTED  Final   Coronavirus OC43 NOT DETECTED NOT DETECTED Final   Metapneumovirus NOT DETECTED NOT DETECTED Final   Rhinovirus / Enterovirus NOT DETECTED NOT DETECTED Final   Influenza A NOT DETECTED NOT DETECTED Final   Influenza B NOT DETECTED NOT DETECTED Final   Parainfluenza Virus 1 NOT DETECTED NOT DETECTED Final   Parainfluenza Virus 2 NOT DETECTED NOT DETECTED Final   Parainfluenza Virus 3 NOT DETECTED NOT DETECTED Final   Parainfluenza Virus 4 NOT DETECTED NOT DETECTED Final   Respiratory Syncytial Virus NOT DETECTED NOT DETECTED Final   Bordetella pertussis NOT DETECTED NOT DETECTED Final   Chlamydophila pneumoniae NOT DETECTED NOT DETECTED Final   Mycoplasma pneumoniae NOT DETECTED NOT DETECTED Final     Labs: BNP (last 3 results) Recent Labs    06/20/17 1743  BNP 43.1   Basic Metabolic Panel: Recent Labs  Lab 06/20/17 1743  NA 137  K 4.5  CL 93*  CO2 36*  GLUCOSE 75  BUN 10  CREATININE 1.10  CALCIUM 8.7*   Liver Function Tests: No results for input(s): AST, ALT, ALKPHOS, BILITOT, PROT, ALBUMIN in the last 168 hours. No results for input(s): LIPASE, AMYLASE in the last 168 hours. No results for input(s): AMMONIA in the last 168 hours. CBC: Recent Labs  Lab 06/20/17 1743  WBC 8.7  NEUTROABS 5.6  HGB 17.4*  HCT 57.8*  MCV 92.6  PLT 174   Cardiac Enzymes: No results for input(s): CKTOTAL, CKMB, CKMBINDEX, TROPONINI in the last 168 hours. BNP: Invalid input(s): POCBNP CBG: Recent Labs  Lab 06/21/17 0747 06/21/17 1207 06/21/17 1639 06/21/17 2116 06/22/17 0730  GLUCAP 155* 150* 142* 114* 148*   D-Dimer No results for input(s): DDIMER in the last 72 hours. Hgb A1c No results for input(s): HGBA1C in the last 72 hours. Lipid Profile No results for input(s): CHOL, HDL, LDLCALC, TRIG, CHOLHDL, LDLDIRECT in the last 72 hours. Thyroid function studies No results for input(s): TSH, T4TOTAL, T3FREE, THYROIDAB in the last 72 hours.  Invalid input(s):  FREET3 Anemia work up No results for input(s): VITAMINB12, FOLATE, FERRITIN, TIBC, IRON, RETICCTPCT in the last 72 hours. Urinalysis    Component Value Date/Time   LABSPEC 1.020 01/02/2011 1242   PHURINE 6.0 01/02/2011 1242  GLUCOSEU NEGATIVE 01/02/2011 1242   HGBUR NEGATIVE 01/02/2011 1242   BILIRUBINUR NEGATIVE 01/02/2011 1242   KETONESUR NEGATIVE 01/02/2011 1242   PROTEINUR NEGATIVE 01/02/2011 1242   UROBILINOGEN 0.2 01/02/2011 1242   NITRITE NEGATIVE 01/02/2011 1242   LEUKOCYTESUR NEGATIVE 01/02/2011 1242   Sepsis Labs Invalid input(s): PROCALCITONIN,  WBC,  LACTICIDVEN Microbiology Recent Results (from the past 240 hour(s))  Respiratory Panel by PCR     Status: None   Collection Time: 06/20/17  9:06 PM  Result Value Ref Range Status   Adenovirus NOT DETECTED NOT DETECTED Final   Coronavirus 229E NOT DETECTED NOT DETECTED Final   Coronavirus HKU1 NOT DETECTED NOT DETECTED Final   Coronavirus NL63 NOT DETECTED NOT DETECTED Final   Coronavirus OC43 NOT DETECTED NOT DETECTED Final   Metapneumovirus NOT DETECTED NOT DETECTED Final   Rhinovirus / Enterovirus NOT DETECTED NOT DETECTED Final   Influenza A NOT DETECTED NOT DETECTED Final   Influenza B NOT DETECTED NOT DETECTED Final   Parainfluenza Virus 1 NOT DETECTED NOT DETECTED Final   Parainfluenza Virus 2 NOT DETECTED NOT DETECTED Final   Parainfluenza Virus 3 NOT DETECTED NOT DETECTED Final   Parainfluenza Virus 4 NOT DETECTED NOT DETECTED Final   Respiratory Syncytial Virus NOT DETECTED NOT DETECTED Final   Bordetella pertussis NOT DETECTED NOT DETECTED Final   Chlamydophila pneumoniae NOT DETECTED NOT DETECTED Final   Mycoplasma pneumoniae NOT DETECTED NOT DETECTED Final     Time coordinating discharge: 31 minutes  SIGNED:   Maxie Barbron Prasad Erinne Gillentine, MD  [facility] 06/22/2017, 11:56 AM  If 7PM-7AM, please contact night-coverage [email or web address] Password [email or web address]

## 2017-06-22 NOTE — Progress Notes (Signed)
Reviewed discharge instructions/medication with pt. Answered his questions. Pt's oxygen has been delivered to his room. Pt is stable and ready for discharge. Waiting on ride.

## 2017-06-22 NOTE — Care Management Note (Signed)
Case Management Note  Patient Details  Name: Kurt [name] MRN: [id number] Date of Birth: 03/23/1961  Subjective/Objective:            Pt presented for SOB.  Pt lives with mother and daughter in an extended-stay hotel.  He states they just sold house and plan to stay there for a few weeks until find a new house.  Pt will be d/c'd later today with new home O2.     Pt states he uses CPAP that is around 55 years old, [facility]  Pt has questions about using both O2 and CPAP.  Pt's last sleep study 15 years ago and plans to have another soon.  Action/Plan: Pt given choice of DME providers and chose [facility].   [name] notified and will have staff assess CPAP and make recommendations. Oxygen will be delivered to room and to hotel after d/c.  Address of Choice extended stay hotel is [address], [place], [id number]. [address].    Expected Discharge Date:                  Expected Discharge Plan:  Home/Self Care  In-House Referral:  NA  Discharge planning Services  CM Consult  Post Acute Care Choice:  Durable Medical Equipment Choice offered to:  Patient  DME Arranged:  Oxygen DME Agency:  [facility]  HH Arranged:  NA HH Agency:  NA  Status of Service:  Completed, signed off  If discussed at Long Length of Stay Meetings, dates discussed:    Additional Comments:  Verdene LennertGoldean, Arch Methot K, RN 06/22/2017, 10:55 AM

## 2017-06-25 ENCOUNTER — Other Ambulatory Visit (HOSPITAL_BASED_OUTPATIENT_CLINIC_OR_DEPARTMENT_OTHER): Payer: Self-pay

## 2017-06-25 DIAGNOSIS — G4730 Sleep apnea, unspecified: Secondary | ICD-10-CM

## 2017-06-26 LAB — CULTURE, BLOOD (ROUTINE X 2)
CULTURE: NO GROWTH
Culture: NO GROWTH
Special Requests: ADEQUATE
Special Requests: ADEQUATE

## 2017-07-16 ENCOUNTER — Encounter (HOSPITAL_BASED_OUTPATIENT_CLINIC_OR_DEPARTMENT_OTHER): Payer: Medicaid Other

## 2017-08-03 ENCOUNTER — Ambulatory Visit (HOSPITAL_BASED_OUTPATIENT_CLINIC_OR_DEPARTMENT_OTHER): Payer: Medicaid Other | Attending: Internal Medicine | Admitting: Internal Medicine

## 2017-08-03 VITALS — Ht 65.0 in | Wt 304.0 lb

## 2017-08-03 DIAGNOSIS — G4719 Other hypersomnia: Secondary | ICD-10-CM | POA: Diagnosis not present

## 2017-08-03 DIAGNOSIS — G4736 Sleep related hypoventilation in conditions classified elsewhere: Secondary | ICD-10-CM | POA: Insufficient documentation

## 2017-08-03 DIAGNOSIS — G4733 Obstructive sleep apnea (adult) (pediatric): Secondary | ICD-10-CM | POA: Diagnosis not present

## 2017-08-03 DIAGNOSIS — Z6841 Body Mass Index (BMI) 40.0 and over, adult: Secondary | ICD-10-CM | POA: Diagnosis not present

## 2017-08-03 DIAGNOSIS — E669 Obesity, unspecified: Secondary | ICD-10-CM | POA: Diagnosis not present

## 2017-08-03 DIAGNOSIS — R0683 Snoring: Secondary | ICD-10-CM | POA: Diagnosis not present

## 2017-08-03 DIAGNOSIS — R5383 Other fatigue: Secondary | ICD-10-CM | POA: Insufficient documentation

## 2017-08-03 DIAGNOSIS — G4730 Sleep apnea, unspecified: Secondary | ICD-10-CM | POA: Diagnosis present

## 2017-08-10 DIAGNOSIS — G4733 Obstructive sleep apnea (adult) (pediatric): Secondary | ICD-10-CM | POA: Diagnosis not present

## 2017-08-10 NOTE — Procedures (Signed)
Patient Name: Kurt [name], Kurt [name] Study Date: 08/03/2017 Gender: Male D.O.B: 1961/05/04 Age (years): 56 Referring Provider: Fleet ContrasEdwin Avbuere Height (inches): 65 Interpreting Physician: Jetty Duhamellinton Young MD, ABSM Weight (lbs): 304 RPSGT: Celene KrasCharles, Nicole BMI: 51 MRN: [id number] Neck Size: 17.50 <br> <br> CLINICAL INFORMATION Sleep Study Type: NPSG Indication for sleep study: Excessive Daytime Sleepiness, Fatigue, Obesity, OSA, Snoring, Witnessed Apneas  Epworth Sleepiness Score: 19  SLEEP STUDY TECHNIQUE As per the AASM Manual for the Scoring of Sleep and Associated Events v2.3 (April 2016) with a hypopnea requiring 4% desaturations.  The channels recorded and monitored were frontal, central and occipital EEG, electrooculogram (EOG), submentalis EMG (chin), nasal and oral airflow, thoracic and abdominal wall motion, anterior tibialis EMG, snore microphone, electrocardiogram, and pulse oximetry.  MEDICATIONS Medications self-administered by patient taken the night of the study : none reported  SLEEP ARCHITECTURE The study was initiated at 10:20:01 PM and ended at 4:49:22 AM.  Sleep onset time was 0.0 minutes and the sleep efficiency was 57.9%. The total sleep time was 225.4 minutes.  Stage REM latency was 282.8 minutes.  The patient spent 7.24% of the night in stage N1 sleep, 82.11% in stage N2 sleep, 0.00% in stage N3 and 10.65% in REM.  Alpha intrusion was absent.  Supine sleep was 46.48%.  RESPIRATORY PARAMETERS The overall apnea/hypopnea index (AHI) was 108.1 per hour. There were 28 total apneas, including 28 obstructive, 0 central and 0 mixed apneas. There were 378 hypopneas and 1 RERAs.  The AHI during Stage REM sleep was 77.5 per hour.  AHI while supine was 113.4 per hour.  The mean oxygen saturation was 83.67%. The minimum SpO2 during sleep was 55.00%. Supplemental O2 2L was increased to 3L per protocol.  soft snoring was noted during this study.  CARDIAC DATA The 2  lead EKG demonstrated sinus rhythm. The mean heart rate was 91.17 beats per minute. Other EKG findings include: None.  LEG MOVEMENT DATA The total PLMS were 0 with a resulting PLMS index of 0.00. Associated arousal with leg movement index was 0.0 .  IMPRESSIONS - Severe obstructive sleep apnea occurred during this study (AHI = 108.1/h). - Insufficient early sleep and events to meet protocol requirements for split CPAP titration on this study night. - No significant central sleep apnea occurred during this study (CAI = 0.0/h). - Severe oxygen desaturation was noted during this study (Min O2 = 55.00%). Patient started with 2L supplemental O2, but was increased to 3L per protocol for continued desaturation - The patient snored with soft snoring volume. - No cardiac abnormalities were noted during this study. - Clinically significant periodic limb movements did not occur during sleep. No significant associated arousals.  DIAGNOSIS - Obstructive Sleep Apnea (327.23 [G47.33 ICD-10]) - Nocturnal Hypoxemia (327.26 [G47.36 ICD-10])  RECOMMENDATIONS - Therapeutic CPAP/ BIPAP titration to determine optimal pressure required to alleviate sleep disordered breathing. - Be careful with alcohol, sedatives and other CNS depressants that may worsen sleep apnea and disrupt normal sleep architecture. - Sleep hygiene should be reviewed to assess factors that may improve sleep quality. - Weight management and regular exercise should be initiated or continued if appropriate.  [Electronically signed] 08/10/2017 09:56 AM  Jetty Duhamellinton Young MD, ABSM Diplomate, American Board of Sleep Medicine   NPI: [id number]                          Jetty Duhamellinton Young Diplomate, American Board of Sleep Medicine  ELECTRONICALLY SIGNED ON:  08/10/2017, 9:49 AM  [place] PH: [phone]   FX: [phone] [place]

## 2017-09-03 ENCOUNTER — Other Ambulatory Visit (INDEPENDENT_AMBULATORY_CARE_PROVIDER_SITE_OTHER): Payer: Medicaid Other

## 2017-09-03 ENCOUNTER — Encounter: Payer: Self-pay | Admitting: Internal Medicine

## 2017-09-03 ENCOUNTER — Ambulatory Visit: Payer: Medicaid Other | Admitting: Internal Medicine

## 2017-09-03 VITALS — BP 124/66 | HR 105 | Ht 66.0 in | Wt 306.0 lb

## 2017-09-03 DIAGNOSIS — R0609 Other forms of dyspnea: Secondary | ICD-10-CM

## 2017-09-03 DIAGNOSIS — J9612 Chronic respiratory failure with hypercapnia: Secondary | ICD-10-CM | POA: Diagnosis not present

## 2017-09-03 DIAGNOSIS — I10 Essential (primary) hypertension: Secondary | ICD-10-CM

## 2017-09-03 DIAGNOSIS — G4733 Obstructive sleep apnea (adult) (pediatric): Secondary | ICD-10-CM

## 2017-09-03 DIAGNOSIS — J9611 Chronic respiratory failure with hypoxia: Secondary | ICD-10-CM

## 2017-09-03 DIAGNOSIS — Z9989 Dependence on other enabling machines and devices: Secondary | ICD-10-CM

## 2017-09-03 LAB — CBC WITH DIFFERENTIAL/PLATELET
Basophils Absolute: 0 10*3/uL (ref 0.0–0.1)
Basophils Relative: 0.6 % (ref 0.0–3.0)
EOS PCT: 2.4 % (ref 0.0–5.0)
Eosinophils Absolute: 0.2 10*3/uL (ref 0.0–0.7)
HEMATOCRIT: 51.2 % (ref 39.0–52.0)
Hemoglobin: 15.9 g/dL (ref 13.0–17.0)
Lymphocytes Relative: 23.9 % (ref 12.0–46.0)
Lymphs Abs: 1.9 10*3/uL (ref 0.7–4.0)
MCHC: 31.1 g/dL (ref 30.0–36.0)
MCV: 87.2 fl (ref 78.0–100.0)
MONOS PCT: 11.5 % (ref 3.0–12.0)
Monocytes Absolute: 0.9 10*3/uL (ref 0.1–1.0)
NEUTROS ABS: 5 10*3/uL (ref 1.4–7.7)
Neutrophils Relative %: 61.6 % (ref 43.0–77.0)
PLATELETS: 163 10*3/uL (ref 150.0–400.0)
RBC: 5.87 Mil/uL — ABNORMAL HIGH (ref 4.22–5.81)
RDW: 17.8 % — ABNORMAL HIGH (ref 11.5–15.5)
WBC: 8.1 10*3/uL (ref 4.0–10.5)

## 2017-09-03 LAB — BASIC METABOLIC PANEL
BUN: 17 mg/dL (ref 6–23)
CHLORIDE: 99 meq/L (ref 96–112)
CO2: 34 meq/L — AB (ref 19–32)
Calcium: 9.3 mg/dL (ref 8.4–10.5)
Creatinine, Ser: 1.35 mg/dL (ref 0.40–1.50)
GFR: 70.5 mL/min (ref >60.00)
GLUCOSE: 79 mg/dL (ref 70–99)
POTASSIUM: 4.2 meq/L (ref 3.5–5.1)
SODIUM: 137 meq/L (ref 135–145)

## 2017-09-03 LAB — BRAIN NATRIURETIC PEPTIDE: Pro B Natriuretic peptide (BNP): 11 pg/mL (ref 0.0–100.0)

## 2017-09-03 LAB — TSH: TSH: 1.41 u[IU]/mL (ref 0.35–4.50)

## 2017-09-03 MED ORDER — LOSARTAN POTASSIUM-HCTZ 50-12.5 MG PO TABS: 1.0000 | ORAL_TABLET | Freq: Every day | ORAL | 3 refills | Status: DC

## 2017-09-03 NOTE — Progress Notes (Signed)
Subjective:     Patient ID: Kurt [name], male   DOB: 05/15/1961,    MRN: [id number]  HPI  [age] yobm with breathing problems dating back to childhood assoc with MO never treated with inhalers then  worse with smoking but even after quit in 2008  At wt  350 breathing went "from bad to worse", variably using either hs 02 or  On cpap and referred to pulmonary clinic 09/03/2017 by Dr   Tyson DenseAvbuerre for sob  09/03/2017 1st [facility] office visit/ Thanya Cegielski   Chief Complaint  Patient presents with  . Pulmonary Consult    Referred by Dr. Ginette OttoAvebuere. Pt states he has always has had SOB and bronchitis since he was a child, but has had increased SOB for the past 3-4 yrs. He states sometimes he gets winded just walking from room to room at home. He is using proair 2 x daily on and albuterol neb 1 x daily on average.   Gradually worse breathing = lifelong and better on 02 / nebs but freq just sob across the room  Doe = MMRC3 = can't walk 100 yards even at a slow pace at a flat grade s stopping due to sob  /02 2lpm  But not while on cpap as the machine is old and no adaptor for it "they said they would call but never did"  No obvious day to day or daytime variability or assoc excess/ purulent sputum or mucus plugs or hemoptysis or cp or chest tightness, subjective wheeze or overt sinus or hb symptoms. No unusual exposure hx or h/o childhood pna/ asthma or knowledge of premature birth.   Also denies any obvious fluctuation of symptoms with weather or environmental changes or other aggravating or alleviating factors except as outlined above   Current Allergies, Complete Past Medical History, Past Surgical History, Family History, and Social History were reviewed in [facility] electronic [facility] record.  ROS  The following are not active complaints unless bolded Hoarseness, sore throat, dysphagia, dental problems, itching, sneezing,  nasal congestion or discharge of excess mucus or purulent secretions, ear ache,    fever, chills, sweats, unintended wt loss or wt gain, classically pleuritic or exertional cp,  orthopnea pnd or leg swelling, presyncope, palpitations, abdominal pain, anorexia, nausea, vomiting, diarrhea  or change in bowel habits or change in bladder habits, change in stools or change in urine, dysuria, hematuria,  rash, arthralgias, visual complaints, headache, numbness, weakness or ataxia or problems with walking or coordination,  change in mood/affect or memory.        Current Meds  Medication Sig  . albuterol (PROAIR HFA) 108 (90 Base) MCG/ACT inhaler Inhale 2 puffs into the lungs 4 (four) times daily as needed for wheezing or shortness of breath.  Marland Kitchen. albuterol (PROVENTIL) (2.5 MG/3ML) 0.083% nebulizer solution Take 2.5 mg by nebulization every 6 (six) hours as needed for wheezing or shortness of breath.  Marland Kitchen. aspirin EC 325 MG tablet Take 325 mg by mouth daily.  Marland Kitchen. dextromethorphan-guaiFENesin (MUCINEX DM) 30-600 MG 12hr tablet Take 1 tablet by mouth 2 (two) times daily as needed for cough.  . furosemide (LASIX) 40 MG tablet Take 40 mg by mouth daily.  . potassium chloride (K-DUR) 10 MEQ tablet Take 10 mEq by mouth daily.  . Vitamin D, Ergocalciferol, (DRISDOL) 50000 units CAPS capsule Take 50,000 Units by mouth once a week.  .  ] lisinopril-hydrochlorothiazide (PRINZIDE,ZESTORETIC) 10-12.5 MG tablet Take 1 tablet by mouth daily.  Review of Systems     Objective:   Physical Exam    massively obese very pleasant amb bm nad   Wt Readings from Last 3 Encounters:  09/03/17 (!) 306 lb (138.8 kg)  08/03/17 (!) 304 lb (137.9 kg)  06/22/17 (!) 305 lb 11.2 oz (138.7 kg)     Vital signs reviewed - Note on arrival 02 sats  92% on ra      HEENT: nl dentition, turbinates bilaterally, and oropharynx. Nl external ear canals without cough reflex Modified Mallampati Score =   2/3   NECK :  without JVD/Nodes/TM/ nl carotid upstrokes bilaterally   LUNGS: no acc muscle use,   Nl contour chest with mostly pseudowheeze    CV:  RRR  no s3 or murmur or increase in P2, and no edema   ABD:  Massively obese with limited inspiratory excursion in the supine position. No bruits or organomegaly appreciated, bowel sounds nl  MS:  Nl gait/ ext warm without deformities, calf tenderness, cyanosis or clubbing No obvious joint restrictions   SKIN: warm and dry without lesions    NEURO:  alert, approp, nl sensorium with  no motor or cerebellar deficits apparent.     I personally reviewed images and agree with radiology impression as follows:  CXR:   06/20/17 Elevated right hemidiaphragm with right base atelectasis.  Assessment:

## 2017-09-03 NOTE — Patient Instructions (Addendum)
Please see patient coordinator before you leave today  to schedule CPAP titration study  Stop lisinopril and start losartan 50-12.5 and  Your breathing should gradually improve   Only use your albuterol (PROAIR)  as a rescue medication to be used if you can't catch your breath by resting or doing a relaxed purse lip breathing pattern.  - The less you use it, the better it will work when you need it. - Ok to use up to 2 puffs  every 4 hours if you must but call for immediate appointment if use goes up over your usual need - Don't leave home without it !!  (think of it like the spare tire for your car)   Only use Albuterol neb if you first try the proair and it doesn't work - ok to use up to every 4 hours if you must    Please schedule a follow up office visit in 6 weeks, call sooner if needed with pfts

## 2017-09-04 ENCOUNTER — Encounter: Payer: Self-pay | Admitting: Internal Medicine

## 2017-09-04 DIAGNOSIS — J9611 Chronic respiratory failure with hypoxia: Secondary | ICD-10-CM | POA: Insufficient documentation

## 2017-09-04 DIAGNOSIS — J9612 Chronic respiratory failure with hypercapnia: Secondary | ICD-10-CM

## 2017-09-04 NOTE — Assessment & Plan Note (Signed)
Echo 06/21/17 ft ventricle: The cavity size was normal. Wall thickness was   increased in a pattern of mild LVH. Systolic function was   vigorous. The estimated ejection fraction was in the range of 65%   to 70%. Wall motion was normal; there were no regional wall   motion abnormalities. The study is not technically sufficient to   allow evaluation of LV diastolic function. - Mitral valve: There was trivial regurgitation. - Right ventricle: The cavity size was moderately dilated. Systolic   function was moderately to severely reduced. - Right atrium: Central venous pressure (est): 15 mm Hg. - Tricuspid valve: There was trivial regurgitation. - Pulmonary arteries: Systolic pressure could not be accurately   estimated. - Pericardium, extracardiac: There was no pericardial effusion. Spirometry 09/03/2017  FEV1 1.78 (64%)  Ratio 85 s curvature with no saba w/in 4 h    Symptoms are markedly disproportionate to objective findings and not clear this is actually much of a  lung problem but pt does appear to have difficult to sort out respiratory symptoms of unknown origin for which  DDX  = almost all start with A and  include Adherence, Ace Inhibitors, Acid Reflux, Active Sinus Disease, Alpha 1 Antitripsin deficiency, Anxiety masquerading as Airways dz,  ABPA,  Allergy(esp in young), Aspiration (esp in elderly), Adverse effects of meds,  Active smokers, A bunch of PE's (a small clot burden can't cause this syndrome unless there is already severe underlying pulm or vascular dz with poor reserve) plus two Bs  = Bronchiectasis and Beta blocker use..and one C= CHF    Most likely here: ACEi adverse effects at the  top of the usual list of suspects and the only way to rule it out is a trial off > see a/p    ? Allergies/ asthma > return for pfts with before and after study- in meantime reviewed approp rx for asthma with saba to avoid tachyphylaxis  ? chf > ? Cor pulmonale developing > see osa

## 2017-09-04 NOTE — Assessment & Plan Note (Signed)
Body mass index is 49.39 kg/m.  -    Lab Results  Component Value Date   TSH 1.41 09/03/2017     Contributing to gerd risk/ doe/reviewed the need and the process to achieve and maintain neg calorie balance > defer f/u primary care including intermittently monitoring thyroid status

## 2017-09-04 NOTE — Assessment & Plan Note (Signed)
See split night study 08/03/17  Needs titration of cpap then 02 added after optimal cpap settings achieved > ordereed

## 2017-09-04 NOTE — Assessment & Plan Note (Signed)
In the best review of chronic cough to date ( NEJM 2016 375 [phone]) ,  ACEi are now felt to cause cough in up to  20% of pts which is a 4 fold increase from previous reports and does not include the variety of non-specific complaints we see in pulmonary clinic in pts on ACEi but previously attributed to another dx like  Copd/asthma and  include PNDS, throat and chest congestion, "bronchitis", unexplained dyspnea and noct "strangling" sensations, and hoarseness, but also  atypical /refractory GERD symptoms like dysphagia and "bad heartburn"   The only way I know  to prove this is not an "ACEi Case" is a trial off ACEi x a minimum of 6 weeks then regroup.   Try losartan 50-12.5 then regroup with full pfts

## 2017-09-04 NOTE — Assessment & Plan Note (Signed)
HCO3  06/20/17  = 36   Probably complicated by early cor pulmonale so  goal is > 90%  24/7

## 2017-09-04 NOTE — Progress Notes (Signed)
Spoke with pt and notified of results per Dr. Wert. Pt verbalized understanding and denied any questions. 

## 2017-09-22 ENCOUNTER — Ambulatory Visit (HOSPITAL_BASED_OUTPATIENT_CLINIC_OR_DEPARTMENT_OTHER): Payer: Medicaid Other | Attending: Internal Medicine | Admitting: Pulmonary Disease

## 2017-09-22 VITALS — Ht 66.0 in | Wt 306.0 lb

## 2017-09-22 DIAGNOSIS — J9610 Chronic respiratory failure, unspecified whether with hypoxia or hypercapnia: Secondary | ICD-10-CM | POA: Diagnosis not present

## 2017-09-22 DIAGNOSIS — G4733 Obstructive sleep apnea (adult) (pediatric): Secondary | ICD-10-CM | POA: Diagnosis present

## 2017-09-22 DIAGNOSIS — Z9981 Dependence on supplemental oxygen: Secondary | ICD-10-CM | POA: Insufficient documentation

## 2017-09-22 DIAGNOSIS — Z9989 Dependence on other enabling machines and devices: Secondary | ICD-10-CM

## 2017-09-22 DIAGNOSIS — Z6841 Body Mass Index (BMI) 40.0 and over, adult: Secondary | ICD-10-CM | POA: Diagnosis not present

## 2017-09-25 DIAGNOSIS — G4733 Obstructive sleep apnea (adult) (pediatric): Secondary | ICD-10-CM | POA: Diagnosis not present

## 2017-09-25 NOTE — Procedures (Signed)
   Patient Name: Kurt [name], Shaune  Study Date: 09/22/2017   Gender: Male  D.O.B: 1961/06/01  Age (years): 57  Referring Provider: Nyoka CowdenMichael B Wert  Height (inches): 65  Interpreting Physician: Coralyn HellingVineet Deniah Saia MD, ABSM  Weight (lbs): 304  RPSGT: Celene KrasCharles, Nicole  BMI: 51  MRN: [id number]  Neck Size: 17.50   CLINICAL INFORMATION  57 yr old male with morbid obesity, chronic respiratory failure on 2 to 3 liters oxygen and had sleep study from 08/03/17 showed severe sleep apnea with an AHI of 108.1. He presents for a titration study. SLEEP STUDY TECHNIQUE  As per the AASM Manual for the Scoring of Sleep and Associated Events v2.3 (April 2016) with a hypopnea requiring 4% desaturations. The channels recorded and monitored were frontal, central and occipital EEG, electrooculogram (EOG), submentalis EMG (chin), nasal and oral airflow, thoracic and abdominal wall motion, anterior tibialis EMG, snore microphone, electrocardiogram, and pulse oximetry. Bilevel positive airway pressure (BPAP) was initiated at the beginning of the study and titrated to treat sleep-disordered breathing. MEDICATIONS  Medications self-administered by patient taken the night of the study : N/A RESPIRATORY PARAMETERS  Optimal IPAP Pressure (cm):  19 AHI at Optimal Pressure (/hr) 0.5  Optimal EPAP Pressure (cm): 15      Overall Minimal O2 (%): 68.0 Minimal O2 at Optimal Pressure (%): 84.0   The study was started with him using his baseline 2 liters oxygen since his oxygen saturation on room air was below 88% prior to starting the study.   He was started on CPAP, but continued to have respiratory eventes in spite of higher CPAP pressures. HE FAILED CPAP.  He was transitioned to Bipap. He did well with Bipap 19/15 cm H2O. He had his oxygen increased to 3 liters with good control of his oxygenation in conjunction with Bipap. SLEEP ARCHITECTURE  Start Time: 10:35:03 PM Stop Time: 4:40:35 AM Total Time (min): 365.5 Total Sleep  Time (min): 310.5  Sleep Latency (min): 42.0 Sleep Efficiency (%): 84.9% REM Latency (min): 75.5 WASO (min): 13.1  Stage N1 (%): 4.3% Stage N2 (%): 66.5% Stage N3 (%): 0.0% Stage R (%): 29.15  Supine (%): 100.00 Arousal Index (/hr): 14.5          CARDIAC DATA  The 2 lead EKG demonstrated sinus rhythm. The mean heart rate was 82.6 beats per minute. Other EKG findings include: None.  LEG MOVEMENT DATA  The total Periodic Limb Movements of Sleep (PLMS) were 0. The PLMS index was 0.0. A PLMS index of <15 is considered normal in adults. IMPRESSIONS  - He was tried on CPAP and failed this due to persistent respiratory events in spite of high CPAP settings. - He did much better once he was transitioned to Bipap. He did well with Bipap 19/15 cm H2O. - He had good control of his oxygenation with addition of 3 liters supplemental oxygen with Bipap. DIAGNOSIS  - Obstructive Sleep Apnea (327.23 [G47.33 ICD-10]) RECOMMENDATIONS  - Trial of BiPAP therapy on 19/15 cm H2O and 3 liters oxygen with a Medium size [employer] Full Face Mask Simplus mask and heated humidification.  [Electronically signed] 09/25/2017 02:52 PM Coralyn HellingVineet Viha Kriegel MD, ABSM  Diplomate, American Board of Sleep Medicine  NPI: [id number]

## 2017-09-26 ENCOUNTER — Telehealth: Payer: Self-pay | Admitting: Internal Medicine

## 2017-09-26 DIAGNOSIS — Z9989 Dependence on other enabling machines and devices: Principal | ICD-10-CM

## 2017-09-26 DIAGNOSIS — G4733 Obstructive sleep apnea (adult) (pediatric): Secondary | ICD-10-CM

## 2017-09-26 NOTE — Telephone Encounter (Signed)
[name], Michael B, MD sent to Kurt [name], Kurt [name] M, CMA        needs     - Trial of BiPAP therapy on 19/15 cm H2O and 3 liters oxygen with a Medium size [employer] Full Face Mask Simplus mask and heated humidification.   Set up f/u with Dr Craige Cottasood as new sleep consult    Spoke with pt and notified of results per Dr. Sherene SiresWert. Pt verbalized understanding and denied any questions. [facility] and cons with [facility] scheduled

## 2017-10-15 ENCOUNTER — Ambulatory Visit: Payer: Medicaid Other | Admitting: Internal Medicine

## 2017-11-07 ENCOUNTER — Ambulatory Visit (INDEPENDENT_AMBULATORY_CARE_PROVIDER_SITE_OTHER): Payer: Medicaid Other | Admitting: Pulmonary Disease

## 2017-11-07 ENCOUNTER — Encounter: Payer: Self-pay | Admitting: Pulmonary Disease

## 2017-11-07 VITALS — BP 122/84 | HR 76 | Ht 66.0 in | Wt 304.0 lb

## 2017-11-07 DIAGNOSIS — Z6841 Body Mass Index (BMI) 40.0 and over, adult: Secondary | ICD-10-CM | POA: Diagnosis not present

## 2017-11-07 DIAGNOSIS — G4733 Obstructive sleep apnea (adult) (pediatric): Secondary | ICD-10-CM

## 2017-11-07 DIAGNOSIS — R0609 Other forms of dyspnea: Secondary | ICD-10-CM | POA: Diagnosis not present

## 2017-11-07 DIAGNOSIS — E662 Morbid (severe) obesity with alveolar hypoventilation: Secondary | ICD-10-CM

## 2017-11-07 NOTE — Progress Notes (Signed)
[facility]  Chief Complaint  Patient presents with  . Sleep Consult    Referred by MW for OSA. Has been using a ASV for the past 5 years. Uses [facility] as his DME.     Vital signs: BP 122/84 (BP Location: Left Arm, Patient Position: Sitting, Cuff Size: Normal)   Pulse 76   Ht  (1.676 m)   Wt (!) 304 lb (137.9 kg)   SpO2 92%   BMI 49.07 kg/m   History of Present Illness: Kurt [name] is a 57 y.o. male with obstructive sleep apnea.  He had sleep study in January.  Showed severe sleep apnea.  Had titration study in March.  Did well with Bipap and supplemental oxygen.  Has full face mask.  Sleeping much better.  More alert during the day.  Only issue is that he has to make his mask very tight to prevent leaking.  He still gets short of breath with exertion.  Has occasional cough.  Was told he has COPD.  Has PFT schedule later this month.  Has changed his diet and is exercising more.  Physical Exam:  General - pleasant Eyes - pupils reactive, wears glasses ENT - no sinus tenderness, no oral exudate, no LAN, MP 3 Cardiac - regular, no murmur Chest - no wheeze, rales Abd - soft, non tender Ext - no edema Skin - no rashes Neuro - normal strength Psych - normal mood  CXR 06/20/17 >> elevated Rt diaphragm    Assessment/Plan:  Obstructive sleep apnea. - We discussed how sleep apnea can affect various health problems, including risks for hypertension, cardiovascular disease, and diabetes.  We also discussed how sleep disruption can increase risks for accidents, such as while driving.  Weight loss as a means of improving sleep apnea was also reviewed.  Additional treatment options discussed were CPAP therapy, oral appliance, and surgical intervention. - he is compliant with Bipap and reports benefit - will change Bipap to 16/12 cm H2O  Obesity hypoventilation syndrome. - continue 3 liters oxygen at night with Bipap  Dyspnea on  exertion. - f/u PFTs - prn albuterol  Obesity. - encouraged him to keep up with exercise and diet program   Patient Instructions  Will change Bipap setting to 16/12 cm H2O  Follow up in 6 months    Coralyn Helling, MD [facility] Care 11/07/2017, 2:27 PM  Flow Sheet  Pulmonary tests:  Sleep tests: PSG 08/03/17 >> AHI 108.1 Bipap 09/22/17 >> Bipap 19/15 cm H2O with 3 liters O2 Bipap 10/14/17 to 11/06/17 >> used on 24 of 24 nights with average 5 hrs 53 min.  Average AHI 1.2 with Bipap 19/15 cm H2O  Cardiac tests: Echo 06/21/17 >> mild LVH, EF 65 to 70%, mod RV dilation  Review of Systems: Constitutional: Negative for fever and unexpected weight change.  HENT: Negative for congestion, dental problem, ear pain, nosebleeds, postnasal drip, rhinorrhea, sinus pressure, sneezing, sore throat and trouble swallowing.   Eyes: Negative for redness and itching.  Respiratory: Negative for cough, chest tightness, shortness of breath and wheezing.   Cardiovascular: Negative for palpitations and leg swelling.  Gastrointestinal: Negative for nausea and vomiting.  Genitourinary: Negative for dysuria.  Musculoskeletal: Negative for joint swelling.  Skin: Negative for rash.  Allergic/Immunologic: Negative.  Negative for environmental allergies, food allergies and immunocompromised state.  Neurological: Negative for headaches.  Hematological: Does not bruise/bleed easily.  Psychiatric/Behavioral: Negative for dysphoric mood. The patient is not nervous/anxious.  Past Medical History: He  has a past medical history of Anxiety, Arthritis, Borderline diabetes, Chronic bronchitis (HCC), COPD (chronic obstructive pulmonary disease) (HCC), Depression, GERD (gastroesophageal reflux disease), Gout, Hypertension, and OSA on CPAP.  Past Surgical History: He  has a past surgical history that includes No past surgeries.  Family History: His family history includes Hypertension in his  mother.  Social History: He  reports that he quit smoking about 11 years ago. His smoking use included cigarettes. He has a 30.00 pack-year smoking history. He has never used smokeless tobacco. He reports that he does not drink alcohol or use drugs.  Medications: Allergies as of 11/07/2017      Reactions   Penicillins Itching   Has patient had a PCN reaction causing immediate rash, facial/tongue/throat swelling, SOB or lightheadedness with hypotension: No Has patient had a PCN reaction causing severe rash involving mucus membranes or skin necrosis: No Has patient had a PCN reaction that required hospitalization: No Has patient had a PCN reaction occurring within the last 10 years: No If all of the above answers are "NO", then may proceed with Cephalosporin use.      Medication List        Accurate as of 11/07/17  2:27 PM. Always use your most recent med list.          albuterol (2.5 MG/3ML) 0.083% nebulizer solution Commonly known as:  PROVENTIL Take 2.5 mg by nebulization every 6 (six) hours as needed for wheezing or shortness of breath.   albuterol 108 (90 Base) MCG/ACT inhaler Commonly known as:  PROAIR HFA Inhale 2 puffs into the lungs 4 (four) times daily as needed for wheezing or shortness of breath.   aspirin EC 325 MG tablet Take 325 mg by mouth daily.   furosemide 40 MG tablet Commonly known as:  LASIX Take 40 mg by mouth daily.   losartan-hydrochlorothiazide 50-12.5 MG tablet Commonly known as:  HYZAAR Take 1 tablet by mouth daily.   potassium chloride 10 MEQ tablet Commonly known as:  K-DUR Take 10 mEq by mouth daily.   Vitamin D (Ergocalciferol) 50000 units Caps capsule Commonly known as:  DRISDOL Take 50,000 Units by mouth once a week.

## 2017-11-07 NOTE — Patient Instructions (Signed)
Will change Bipap setting to 16/12 cm H2O  Follow up in 6 months

## 2017-11-07 NOTE — Progress Notes (Signed)
   Subjective:    Patient ID: Kurt [name], male    DOB: 09-03-1960, 57 y.o.   MRN: [id number]  HPI    Review of Systems  Constitutional: Negative for fever and unexpected weight change.  HENT: Negative for congestion, dental problem, ear pain, nosebleeds, postnasal drip, rhinorrhea, sinus pressure, sneezing, sore throat and trouble swallowing.   Eyes: Negative for redness and itching.  Respiratory: Negative for cough, chest tightness, shortness of breath and wheezing.   Cardiovascular: Negative for palpitations and leg swelling.  Gastrointestinal: Negative for nausea and vomiting.  Genitourinary: Negative for dysuria.  Musculoskeletal: Negative for joint swelling.  Skin: Negative for rash.  Allergic/Immunologic: Negative.  Negative for environmental allergies, food allergies and immunocompromised state.  Neurological: Negative for headaches.  Hematological: Does not bruise/bleed easily.  Psychiatric/Behavioral: Negative for dysphoric mood. The patient is not nervous/anxious.        Objective:   Physical Exam        Assessment & Plan:

## 2017-11-10 ENCOUNTER — Ambulatory Visit: Payer: Medicaid Other | Admitting: Internal Medicine

## 2017-11-11 ENCOUNTER — Ambulatory Visit: Payer: Medicaid Other | Admitting: Internal Medicine

## 2017-11-19 ENCOUNTER — Ambulatory Visit (INDEPENDENT_AMBULATORY_CARE_PROVIDER_SITE_OTHER): Payer: Medicaid Other | Admitting: Internal Medicine

## 2017-11-19 DIAGNOSIS — R0609 Other forms of dyspnea: Secondary | ICD-10-CM | POA: Diagnosis not present

## 2017-11-19 LAB — PULMONARY FUNCTION TEST
DL/VA % PRED: 147 %
DL/VA: 6.45 ml/min/mmHg/L
DLCO UNC % PRED: 96 %
DLCO UNC: 26.04 ml/min/mmHg
FEF 25-75 POST: 1.74 L/s
FEF 25-75 PRE: 2 L/s
FEF2575-%Change-Post: -13 %
FEF2575-%PRED-POST: 63 %
FEF2575-%Pred-Pre: 73 %
FEV1-%Change-Post: -1 %
FEV1-%Pred-Post: 69 %
FEV1-%Pred-Pre: 70 %
FEV1-Post: 1.93 L
FEV1-Pre: 1.96 L
FEV1FVC-%Change-Post: 0 %
FEV1FVC-%PRED-PRE: 104 %
FEV6-%Change-Post: -1 %
FEV6-%PRED-POST: 69 %
FEV6-%Pred-Pre: 70 %
FEV6-Post: 2.35 L
FEV6-Pre: 2.38 L
FEV6FVC-%PRED-POST: 104 %
FEV6FVC-%Pred-Pre: 104 %
FVC-%Change-Post: -1 %
FVC-%PRED-PRE: 67 %
FVC-%Pred-Post: 66 %
FVC-PRE: 2.38 L
FVC-Post: 2.35 L
PRE FEV1/FVC RATIO: 82 %
Post FEV1/FVC ratio: 82 %
Post FEV6/FVC ratio: 100 %
Pre FEV6/FVC Ratio: 100 %
RV % pred: 91 %
RV: 1.78 L
TLC % pred: 69 %
TLC: 4.32 L

## 2017-11-19 NOTE — Progress Notes (Signed)
Patient completed full PFT today. 

## 2017-11-20 NOTE — Progress Notes (Signed)
Spoke with pt and notified of results per Dr. Wert. Pt verbalized understanding and denied any questions. 

## 2017-12-03 ENCOUNTER — Encounter: Payer: Self-pay | Admitting: Internal Medicine

## 2017-12-03 ENCOUNTER — Ambulatory Visit: Payer: Medicaid Other | Admitting: Internal Medicine

## 2017-12-03 VITALS — BP 124/78 | HR 85 | Ht 66.0 in | Wt 308.0 lb

## 2017-12-03 DIAGNOSIS — R0609 Other forms of dyspnea: Secondary | ICD-10-CM | POA: Diagnosis not present

## 2017-12-03 DIAGNOSIS — I10 Essential (primary) hypertension: Secondary | ICD-10-CM | POA: Diagnosis not present

## 2017-12-03 NOTE — Assessment & Plan Note (Signed)
Try off acei due to pseudowheeze 09/03/2017 > resolved 12/03/2017   Although even in retrospect it may not be clear the ACEi contributed to the pt's symptoms,  Pt improved off them and adding them back at this point or in the future would risk confusion in interpretation of non-specific respiratory symptoms to which this patient is prone  ie  Better not to muddy the waters here.   Follow up per Primary Care planned

## 2017-12-03 NOTE — Assessment & Plan Note (Signed)
Echo 06/21/17 ft ventricle: The cavity size was normal. Wall thickness was   increased in a pattern of mild LVH. Systolic function was   vigorous. The estimated ejection fraction was in the range of 65%   to 70%. Wall motion was normal; there were no regional wall   motion abnormalities. The study is not technically sufficient to   allow evaluation of LV diastolic function. - Mitral valve: There was trivial regurgitation. - Right ventricle: The cavity size was moderately dilated. Systolic   function was moderately to severely reduced. - Right atrium: Central venous pressure (est): 15 mm Hg. - Tricuspid valve: There was trivial regurgitation. - Pulmonary arteries: Systolic pressure could not be accurately   estimated. - Pericardium, extracardiac: There was no pericardial effusion. Spirometry 09/03/2017  FEV1 1.78 (64%)  Ratio 85 s curvature with no saba w/in 4 h   - PFT's  11/19/2017   FEV1 1.93 (69 % ) ratio 82  p 0 % improvement from saba p ? prior to study with DLCO  96 % corrects to 147 % for alv volume   ERV 46  - 12/03/2017   Walked RA  2 laps @ 185 ft each stopped due to  Sob at fast pace, sats 89% at end   I had an extended final summary discussion with the patient reviewing all relevant studies completed to date and  lasting 15 to 20 minutes of a 25 minute visit on the following issues:    Kurt [name] C/w obesity / deconditioning and not asthma  No pulmonary f/u needed > sleep medicine per Dr Craige Cotta only   Each maintenance medication was reviewed in detail including most importantly the difference between maintenance and as needed and under what circumstances the prns are to be used.  Please see AVS for specific  Instructions which are unique to this visit and I personally typed out  which were reviewed in detail in writing with the patient and a copy provided.

## 2017-12-03 NOTE — Patient Instructions (Signed)
Weight control is simply a matter of calorie balance which needs to be tilted in your favor by eating less and exercising more.  To get the most out of exercise, you need to be continuously aware that you are short of breath, but never out of breath, for 30 minutes daily. As you improve, it will actually be easier for you to do the same amount of exercise  in  30 minutes so always push to the level where you are short of breath.  If this does not result in gradual weight reduction then I strongly recommend you see a nutritionist with a food diary x 2 weeks so that we can work out a negative calorie balance which is universally effective in steady weight loss programs.  Think of your calorie balance like you do your bank account where in this case you want the balance to go down so you must take in less calories than you burn up.  It's just that simple:  Hard to do, but easy to understand.  Good luck!    See Dr Craige Cotta for all your night time symptoms and in this clinic as needed

## 2017-12-03 NOTE — Assessment & Plan Note (Signed)
Body mass index is 49.71 kg/m.  -  trending up Lab Results  Component Value Date   TSH 1.41 09/03/2017     Contributing to   doe/reviewed the need and the process to achieve and maintain neg calorie balance > defer f/u primary care including intermittently monitoring thyroid status

## 2017-12-03 NOTE — Progress Notes (Signed)
Subjective:     Patient ID: Kurt [name], male   DOB: [birth year],    MRN: [id number]  HPI  56 yobm with breathing problems dating back to childhood assoc with MO never treated with inhalers then  worse with smoking but even after quit in 2008  At wt  350 breathing went "from bad to worse", variably using either hs 02 or  On cpap and referred to pulmonary clinic 09/03/2017 by Dr   Tyson Dense for sob  09/03/2017 1st [facility] office visit/ Kurt [name]   Chief Complaint  Patient presents with  . Pulmonary Consult    Referred by Dr. Ginette Otto. Pt states he has always has had SOB and bronchitis since he was a child, but has had increased SOB for the past 3-4 yrs. He states sometimes he gets winded just walking from room to room at home. He is using proair 2 x daily on and albuterol neb 1 x daily on average.   Gradually worse breathing = lifelong and better on 02 / nebs but freq just sob across the room  Doe = [facility] = can't walk 100 yards even at a slow pace at a flat grade s stopping due to sob  /02 2lpm  But not while on cpap as the machine is old and no adaptor for it "they said they would call but never did" rec Please see patient coordinator before you leave today  to schedule CPAP titration study> bipap per Craige Cotta Stop lisinopril and start losartan 50-12.5 and  Your breathing should gradually improve Only use your albuterol (PROAIR) as a rescue med Only use Albuterol neb if you first try the proair    12/03/2017  f/u ov/[name] re: doe with pfts c/w MO only  Chief Complaint  Patient presents with  . Follow-up    Breathing is overall doing well. He has occ cough in the mornings with light yellow sputum. He uses his albuterol inhaler once per wk on average and has not had to use neb.    Dyspnea:  Improved to [facility] = can't walk a nl pace on a flat grade s sob but does fine slow and flat better on port 02 than off  Cough: min am mucus Sleep: fine on bipap and 02 SABA use:  As above, never  noct   No obvious day to day or daytime variability or assoc excess/ purulent sputum or mucus plugs or hemoptysis or cp or chest tightness, subjective wheeze or overt sinus or hb symptoms. No unusual exposure hx or h/o childhood pna/ asthma or knowledge of premature birth.  Sleeping  Flat on bipap   without nocturnal  or early am exacerbation  of respiratory  c/o's or need for noct saba. Also denies any obvious fluctuation of symptoms with weather or environmental changes or other aggravating or alleviating factors except as outlined above   Current Allergies, Complete Past Medical History, Past Surgical History, Family History, and Social History were reviewed in [employer] record.  ROS  The following are not active complaints unless bolded Hoarseness, sore throat, dysphagia, dental problems, itching, sneezing,  nasal congestion or discharge of excess mucus or purulent secretions, ear ache,   fever, chills, sweats, unintended wt loss or wt gain, classically pleuritic or exertional cp,  orthopnea pnd or arm/hand swelling  or leg swelling, presyncope, palpitations, abdominal pain, anorexia, nausea, vomiting, diarrhea  or change in bowel habits or change in bladder habits, change in stools or change in urine, dysuria, hematuria,  rash, arthralgias, visual complaints, headache, numbness, weakness or ataxia or problems with walking or coordination,  change in mood or  memory.        Current Meds  Medication Sig  . albuterol (PROAIR HFA) 108 (90 Base) MCG/ACT inhaler Inhale 2 puffs into the lungs 4 (four) times daily as needed for wheezing or shortness of breath.  Marland Kitchen albuterol (PROVENTIL) (2.5 MG/3ML) 0.083% nebulizer solution Take 2.5 mg by nebulization every 6 (six) hours as needed for wheezing or shortness of breath.  Marland Kitchen aspirin EC 325 MG tablet Take 325 mg by mouth daily.  . furosemide (LASIX) 40 MG tablet Take 40 mg by mouth daily.  Marland Kitchen losartan-hydrochlorothiazide (HYZAAR)  50-12.5 MG tablet Take 1 tablet by mouth daily.  . potassium chloride (K-DUR) 10 MEQ tablet Take 10 mEq by mouth daily.  . Vitamin D, Ergocalciferol, (DRISDOL) 50000 units CAPS capsule Take 50,000 Units by mouth once a week.       Objective:   Physical Exam  amb obese bm nad    12/03/2017         398   09/03/17 (!) 306 lb (138.8 kg)  08/03/17 (!) 304 lb (137.9 kg)  06/22/17 (!) 305 lb 11.2 oz (138.7 kg)     Vital signs reviewed - Note on arrival 02 sats  95% on RA  - BP  124/78   HEENT: nl dentition, turbinates bilaterally, and oropharynx. Nl external ear canals without cough reflex   NECK :  without JVD/Nodes/TM/ nl carotid upstrokes bilaterally   LUNGS: no acc muscle use,  Nl contour chest which is clear to A and P bilaterally without cough on insp or exp maneuvers   CV:  RRR  no s3 or murmur or increase in P2, and no edema   ABD:  Massively obese with limited  inspiratory excursion in the supine position. No bruits or organomegaly appreciated, bowel sounds nl  MS:  Nl gait/ ext warm without deformities, calf tenderness, cyanosis or clubbing No obvious joint restrictions   SKIN: warm and dry without lesions    NEURO:  alert, approp, nl sensorium with  no motor or cerebellar deficits apparent.          I personally reviewed images and agree with radiology impression as follows:  CXR:   06/20/17 Elevated right hemidiaphragm with right base atelectasis.  Assessment:

## 2017-12-23 ENCOUNTER — Encounter (HOSPITAL_COMMUNITY): Payer: Self-pay | Admitting: Emergency Medicine

## 2017-12-23 ENCOUNTER — Ambulatory Visit (HOSPITAL_COMMUNITY)
Admission: EM | Admit: 2017-12-23 | Discharge: 2017-12-23 | Disposition: A | Discharge status: Home / Self Care | Payer: Medicaid Other | Attending: Internal Medicine | Admitting: Internal Medicine

## 2017-12-23 ENCOUNTER — Other Ambulatory Visit: Payer: Self-pay

## 2017-12-23 DIAGNOSIS — H9202 Otalgia, left ear: Secondary | ICD-10-CM

## 2017-12-23 MED ORDER — FLUTICASONE PROPIONATE 50 MCG/ACT NA SUSP: 1.0000 | Freq: Every day | NASAL | 0 refills | Status: DC

## 2017-12-23 NOTE — Discharge Instructions (Addendum)
Try the flonase nasal spray for 2 weeks. If not better, then follow up with your family doctor or a ENT specialist.

## 2017-12-23 NOTE — ED Provider Notes (Signed)
[facility]    CSN: [id number] Arrival date & time: 12/23/17  1028     History   Chief Complaint Chief Complaint  Patient presents with  . Sore Throat  . Otalgia    HPI Kurt [name] is a 57 y.o. male.   Patient is here for 2-3 weeks of intermittent sore throat and left ear discomfort. No pain reported. Left ear feels full and sensitive. Wind blowing against the ear makes it worse. Symptoms associated with congestion. Denies fever, sinus pain, dental pain, runny nose, coughing, shortness of breath, chest pain, headache or dizziness.  Denies difficulty swallowing.  Patient have not tried anything over-the-counter to help.     Past Medical History:  Diagnosis Date  . Anxiety   . Arthritis    "hands, knees, joints, lower back" (06/20/2017)  . Borderline diabetes   . Chronic bronchitis (HCC)   . COPD (chronic obstructive pulmonary disease) (HCC)    dx'd 06/20/2017  . Depression   . GERD (gastroesophageal reflux disease)   . Gout   . Hypertension   . OSA on CPAP     Patient Active Problem List   Diagnosis Date Noted  . Chronic respiratory failure with hypoxia and hypercapnia (HCC) 09/04/2017  . Morbid obesity due to excess calories (HCC) 09/04/2017  . Dyspnea on exertion 09/03/2017  . Conjunctivitis 06/20/2017  . Acute on chronic respiratory failure with hypoxia (HCC) 06/20/2017  . COPD with acute exacerbation (HCC) 06/20/2017  . Bilateral leg edema 06/20/2017  . Acute respiratory failure with hypoxia (HCC) 06/20/2017  . OSA on CPAP   . Essential hypertension   . Diabetes mellitus without complication [facility])     Past Surgical History:  Procedure Laterality Date  . NO PAST SURGERIES         Home Medications    Prior to Admission medications   Medication Sig Start Date End Date Taking? Authorizing Provider  albuterol (PROAIR HFA) 108 (90 Base) MCG/ACT inhaler Inhale 2 puffs into the lungs 4 (four) times daily as needed for wheezing or shortness  of breath. 06/22/17   Maxie Barb, MD  albuterol (PROVENTIL) (2.5 MG/3ML) 0.083% nebulizer solution Take 2.5 mg by nebulization every 6 (six) hours as needed for wheezing or shortness of breath.    [provider]  aspirin EC 325 MG tablet Take 325 mg by mouth daily.    [provider]  fluticasone (FLONASE) 50 MCG/ACT nasal spray Place 1 spray into both nostrils daily for 14 days. 12/23/17 01/06/18  Lucia Estelle, NP  furosemide (LASIX) 40 MG tablet Take 40 mg by mouth daily.    [provider]  losartan-hydrochlorothiazide (HYZAAR) 50-12.5 MG tablet Take 1 tablet by mouth daily. 09/03/17   Nyoka Cowden, MD  potassium chloride (K-DUR) 10 MEQ tablet Take 10 mEq by mouth daily. 06/11/17   [provider]  Vitamin D, Ergocalciferol, (DRISDOL) 50000 units CAPS capsule Take 50,000 Units by mouth once a week. 06/18/17   [provider]    Family History Family History  Problem Relation Age of Onset  . Hypertension Mother     Social History Social History   Tobacco Use  . Smoking status: Former Smoker    Packs/day: 1.00    Years: 30.00    Pack years: 30.00    Types: Cigarettes    Last attempt to quit: 2008    Years since quitting: 11.4  . Smokeless tobacco: Never Used  Substance Use Topics  . Alcohol use:  No    Comment: 06/20/2017 "nothing since mid 2016"  . Drug use: No    Comment: 06/20/2017 "nothing since mid 2000s"     Allergies   Penicillins   Review of Systems Review of Systems  Constitutional:       As stated in the HPI     Physical Exam Triage Vital Signs ED Triage Vitals [12/23/17 1103]  Enc Vitals Group     BP (!) 146/94     Pulse Rate 93     Resp 20     Temp 99 F (37.2 C)     Temp Source Oral     SpO2 95 %     Weight      Height      Head Circumference      Peak Flow      Pain Score 7     Pain Loc      Pain Edu?      Excl. in GC?    No data found.  Updated Vital Signs BP (!) 146/94 (BP  Location: Right Arm)   Pulse 93   Temp 99 F (37.2 C) (Oral)   Resp 20   SpO2 95%   Physical Exam  Constitutional: He is oriented to person, place, and time. He appears well-developed and well-nourished.  HENT:  Head: Normocephalic and atraumatic.  Right Ear: Hearing and tympanic membrane normal. No middle ear effusion.  Left Ear: Tympanic membrane normal.  No middle ear effusion.  Mouth/Throat: Mucous membranes are normal.  Tonsils are 2+ without erythema, edema or exudate.  Eyes: Pupils are equal, round, and reactive to light.  Neck: Normal range of motion. Neck supple.  Cardiovascular: Normal rate and normal heart sounds.  Pulmonary/Chest: Effort normal and breath sounds normal. He has no wheezes.  Lymphadenopathy:    He has no cervical adenopathy.  Neurological: He is alert and oriented to person, place, and time.  Skin: Skin is warm and dry.  Nursing note and vitals reviewed.    UC Treatments / Results  Labs (all labs ordered are listed, but only abnormal results are displayed) Labs Reviewed - No data to display  EKG None  Radiology No results found.  Procedures Procedures (including critical care time)  Medications Ordered in UC Medications - No data to display  Initial Impression / Assessment and Plan / UC Course  I have reviewed the triage vital signs and the nursing notes.  Pertinent labs & imaging results that were available during my care of the patient were reviewed by me and considered in my medical decision making (see chart for details).  Final Clinical Impressions(s) / UC Diagnoses   Final diagnoses:  Left ear pain   Physical examination was normal without clear etiology.  Will trial 2 weeks of Flonase for the ear discomfort and congestion. Patient states understanding and will call with questions or problems. Patient instructed to call or follow up with his/her primary care doctor if failure to improve or change in symptoms. Discharge instruction  given.      Discharge Instructions     Try the flonase nasal spray for 2 weeks. If not better, then follow up with your family doctor or a ENT specialist.     ED Prescriptions    Medication Sig Dispense Auth. Provider   fluticasone (FLONASE) 50 MCG/ACT nasal spray Place 1 spray into both nostrils daily for 14 days. 15.8 g Lucia Estelle, NP     Controlled Substance Prescriptions [place] Controlled Substance Registry  consulted? No   Lucia EstelleZheng, [place], NP 12/23/17 1121

## 2017-12-23 NOTE — ED Triage Notes (Signed)
[facility] a [facility] sore throat and left ear pain off and on x 2 weeks.

## 2018-03-02 ENCOUNTER — Encounter: Payer: Self-pay | Admitting: Pulmonary Disease

## 2018-03-02 ENCOUNTER — Ambulatory Visit: Payer: Medicaid Other | Admitting: Pulmonary Disease

## 2018-03-02 VITALS — BP 122/90 | HR 76 | Ht 67.0 in | Wt 306.0 lb

## 2018-03-02 DIAGNOSIS — Z6841 Body Mass Index (BMI) 40.0 and over, adult: Secondary | ICD-10-CM

## 2018-03-02 DIAGNOSIS — G4733 Obstructive sleep apnea (adult) (pediatric): Secondary | ICD-10-CM | POA: Diagnosis not present

## 2018-03-02 DIAGNOSIS — J9611 Chronic respiratory failure with hypoxia: Secondary | ICD-10-CM

## 2018-03-02 DIAGNOSIS — E662 Morbid (severe) obesity with alveolar hypoventilation: Secondary | ICD-10-CM

## 2018-03-02 DIAGNOSIS — J9612 Chronic respiratory failure with hypercapnia: Secondary | ICD-10-CM

## 2018-03-02 NOTE — Progress Notes (Signed)
[place] [facility]  Chief Complaint  Patient presents with  . Follow-up    wearing cpap avg 4-5hr nightly- feels pressure is too strong and mask is leaking.     Constitutional: BP 122/90 (BP Location: Left Arm, Cuff Size: Normal)   Pulse 76   Ht 5\' 7"  (1.702 m)   Wt (!) 306 lb (138.8 kg)   SpO2 96%   BMI 47.93 kg/m   History of Present Illness: Kurt [name] is a 57 y.o. male with obstructive sleep apnea, obesity hypoventilation, and chronic respiratory failure.  He uses Bipap nightly.  No issues with mask fit.  Feels pressure is too high, and then gets uncomfortable to wear.  Denies sinus congestion, dry mouth, or sore throat.  Gets cough with clear sputum in the morning.  Not having aerophagia.  Comprehensive Respiratory Exam:  Appearance - well kempt  ENMT - nasal mucosa moist, turbinates clear, midline nasal septum, no dental lesions, no gingival bleeding, no oral exudates, no tonsillar hypertrophy, MP 3 Neck - no masses, trachea midline, no thyromegaly, no elevation in JVP Respiratory - normal appearance of chest wall, normal respiratory effort w/o accessory muscle use, no dullness on percussion, no wheezing or rales CV - s1s2 regular rate and rhythm, no murmurs, no peripheral edema, radial pulses symmetric GI - soft, non tender, no masses Lymph - no adenopathy noted in neck and axillary areas MSK - normal muscle strength and tone, normal gait Ext - no cyanosis, clubbing, or joint inflammation noted Skin - no rashes, lesions, or ulcers Neuro - oriented to person, place, and time Psych - normal mood and affect  Assessment/Plan:  Obstructive sleep apnea. - he feels that Bipap settings are still too high - will change to Bipap 12/8 cm H2O and get download after pressure change  Obesity hypoventilation syndrome. - continue 3 liters oxygen at night with Bipap  Obesity. - discussed options to assist with weight loss   Patient  Instructions  Will have your Bipap settings changed to 12/8 cm H2O  Follow up in 6 months    Coralyn HellingVineet Lori Liew, MD [facility] 03/02/2018, 3:40 PM  Flow Sheet  Pulmonary tests: PFT 11/19/17 >> FEV1 1.96 (70%), FEV1% 82, TLC 4.32 (69%), DLCO 96%  Sleep tests: PSG 08/03/17 >> AHI 108.1 Bipap 09/22/17 >> Bipap 19/15 cm H2O with 3 liters O2 Bipap 01/28/18 to 02/26/18 >> used on 30 of 30 nights with average 5 hrs 48 min.  Average AHI 0.9 with Bipap 16/12 cm H2O.  Air leak.  Cardiac tests: Echo 06/21/17 >> mild LVH, EF 65 to 70%, mod RV dilation  Past Medical History: He  has a past medical history of Anxiety, Arthritis, Borderline diabetes, Chronic bronchitis (HCC), COPD (chronic obstructive pulmonary disease) (HCC), Depression, GERD (gastroesophageal reflux disease), Gout, Hypertension, and OSA on CPAP.  Past Surgical History: He  has a past surgical history that includes No past surgeries.  Family History: His family history includes Hypertension in his mother.  Social History: He  reports that he quit smoking about 11 years ago. His smoking use included cigarettes. He has a 30.00 pack-year smoking history. He has never used smokeless tobacco. He reports that he does not drink alcohol or use drugs.  Medications: Allergies as of 03/02/2018      Reactions   Penicillins Itching   Has patient had a PCN reaction causing immediate rash, facial/tongue/throat swelling, SOB or lightheadedness with hypotension: No Has patient had a PCN reaction causing  severe rash involving mucus membranes or skin necrosis: No Has patient had a PCN reaction that required hospitalization: No Has patient had a PCN reaction occurring within the last 10 years: No If all of the above answers are "NO", then may proceed with Cephalosporin use.      Medication List        Accurate as of 03/02/18  3:40 PM. Always use your most recent med list.          albuterol (2.5 MG/3ML) 0.083% nebulizer  solution Commonly known as:  PROVENTIL Take 2.5 mg by nebulization every 6 (six) hours as needed for wheezing or shortness of breath.   albuterol 108 (90 Base) MCG/ACT inhaler Commonly known as:  PROVENTIL HFA;VENTOLIN HFA Inhale 2 puffs into the lungs 4 (four) times daily as needed for wheezing or shortness of breath.   aspirin EC 325 MG tablet Take 325 mg by mouth daily.   cetirizine 10 MG tablet Commonly known as:  ZYRTEC Take 10 mg by mouth as needed.   fluticasone 50 MCG/ACT nasal spray Commonly known as:  FLONASE Place 1 spray into both nostrils daily for 14 days.   furosemide 40 MG tablet Commonly known as:  LASIX Take 40 mg by mouth daily.   ibuprofen 800 MG tablet Commonly known as:  ADVIL,MOTRIN TK 1 T PO BID PRN   losartan-hydrochlorothiazide 50-12.5 MG tablet Commonly known as:  HYZAAR Take 1 tablet by mouth daily.   potassium chloride 10 MEQ tablet Commonly known as:  K-DUR Take 10 mEq by mouth daily.   Vitamin D (Ergocalciferol) 50000 units Caps capsule Commonly known as:  DRISDOL Take 50,000 Units by mouth once a week.

## 2018-03-02 NOTE — Patient Instructions (Signed)
Will have your Bipap settings changed to 12/8 cm H2O  Follow up in 6 months

## 2019-01-13 ENCOUNTER — Ambulatory Visit (INDEPENDENT_AMBULATORY_CARE_PROVIDER_SITE_OTHER): Payer: Medicaid Other | Admitting: Internal Medicine

## 2019-01-13 ENCOUNTER — Encounter: Payer: Self-pay | Admitting: Internal Medicine

## 2019-01-13 ENCOUNTER — Ambulatory Visit (INDEPENDENT_AMBULATORY_CARE_PROVIDER_SITE_OTHER): Payer: Medicaid Other

## 2019-01-13 ENCOUNTER — Other Ambulatory Visit: Payer: Self-pay

## 2019-01-13 DIAGNOSIS — R0609 Other forms of dyspnea: Secondary | ICD-10-CM | POA: Diagnosis not present

## 2019-01-13 DIAGNOSIS — J45991 Cough variant asthma: Secondary | ICD-10-CM

## 2019-01-13 DIAGNOSIS — J9611 Chronic respiratory failure with hypoxia: Secondary | ICD-10-CM

## 2019-01-13 DIAGNOSIS — J9612 Chronic respiratory failure with hypercapnia: Secondary | ICD-10-CM

## 2019-01-13 MED ORDER — FAMOTIDINE 20 MG PO TABS: ORAL_TABLET | ORAL | 11 refills | Status: DC

## 2019-01-13 MED ORDER — MOMETASONE FURO-FORMOTEROL FUM 100-5 MCG/ACT IN AERO: 2.0000 | INHALATION_SPRAY | Freq: Two times a day (BID) | RESPIRATORY_TRACT | 0 refills | Status: DC

## 2019-01-13 MED ORDER — PANTOPRAZOLE SODIUM 40 MG PO TBEC: 40.0000 mg | DELAYED_RELEASE_TABLET | Freq: Every day | ORAL | 2 refills | Status: DC

## 2019-01-13 NOTE — Progress Notes (Signed)
Subjective:     Patient ID: Kurt [name], male   DOB: 01/23/1961,    MRN: [id number]    Brief patient profile:  57 yobm quit smoking 2008  with breathing problems dating back to childhood assoc with MO never treated with inhalers then  worse with smoking but even after quit in 2008  At wt  350 breathing went "from bad to worse", variably using either hs 02 or  On cpap and referred to pulmonary clinic 09/03/2017 by Dr   Tyson DenseAvbuerre for sob with restrictive pfts presumed due to body habitus   History of Present Illness  09/03/2017 1st [place] [facility] office visit/ Wert   Chief Complaint  Patient presents with  . Pulmonary Consult    Referred by Dr. Ginette OttoAvebuere. Pt states he has always has had SOB and bronchitis since he was a child, but has had increased SOB for the past 3-4 yrs. He states sometimes he gets winded just walking from room to room at home. He is using proair 2 x daily on and albuterol neb 1 x daily on average.   Gradually worse breathing = lifelong and better on 02 / nebs but freq just sob across the room  Doe = MMRC3 = can't walk 100 yards even at a slow pace at a flat grade s stopping due to sob  /02 2lpm  But not while on cpap as the machine is old and no adaptor for it "they said they would call but never did" rec Please see patient coordinator before you leave today  to schedule CPAP titration study> bipap per Craige CottaSood Stop lisinopril and start losartan 50-12.5 and  Your breathing should gradually improve Only use your albuterol (PROAIR) as a rescue med Only use Albuterol neb if you first try the proair    12/03/2017  f/u ov/Wert re: doe with pfts c/w MO only  Chief Complaint  Patient presents with  . Follow-up    Breathing is overall doing well. He has occ cough in the mornings with light yellow sputum. He uses his albuterol inhaler once per wk on average and has not had to use neb.   Dyspnea:  Improved to [facility] = can't walk a nl pace on a flat grade s sob but does fine slow and  flat better on port 02 than off  Cough: min am mucus Sleep: fine on bipap and 02 SABA use:  As above, never noct rec Wt loss    01/13/2019 acute extended ov/Wert re: worse doe  x 2 month/ indolent onset/ gradaully progressive  Chief Complaint  Patient presents with  . Acute Visit    Increased SOB over the past 2 months. He states he gets SOB walking from parking lot to building. He occ has cough with tan sputum.   no prolem sitting /sleeping on cpap and 02  Main issue is doe worse in heat but also at [employer] can't do but 2 aisle then has to stop MMRC3 = can't walk 100 yards even at a slow pace at a flat grade s stopping due to sob   More cough since doe but min prod tan mucus, daytime only  Better p neb last used neb two days prior to OV   No more leg swelling than usual  Does fine on cpap and 02  Had "full labs" one day prior to ov at pcp office hasn't heard any results yet    No obvious day to day or daytime variability or assoc  mucus plugs or hemoptysis or cp or chest tightness, subjective wheeze or overt sinus or hb symptoms.   Sleeping as above without nocturnal  or early am exacerbation  of respiratory  c/o's or need for noct saba. Also denies any obvious fluctuation of symptoms with weather or environmental changes or other aggravating or alleviating factors except as outlined above   No unusual exposure hx or h/o childhood pna/ asthma or knowledge of premature birth.  Current Allergies, Complete Past Medical History, Past Surgical History, Family History, and Social History were reviewed in [facility] electronic medical record.  ROS  The following are not active complaints unless bolded Hoarseness, sore throat, dysphagia, dental problems, itching, sneezing,  nasal congestion or discharge of excess mucus or purulent secretions, ear ache,   fever, chills, sweats, unintended wt loss or wt gain, classically pleuritic or exertional cp,  orthopnea pnd or arm/hand swelling  or  leg swelling, presyncope, palpitations, abdominal pain, anorexia, nausea, vomiting, diarrhea  or change in bowel habits or change in bladder habits, change in stools or change in urine, dysuria, hematuria,  rash, arthralgias, visual complaints, headache, numbness, weakness or ataxia or problems with walking or coordination,  change in mood or  memory.        Current Meds  Medication Sig  . albuterol (PROAIR HFA) 108 (90 Base) MCG/ACT inhaler Inhale 2 puffs into the lungs 4 (four) times daily as needed for wheezing or shortness of breath.  Marland Kitchen. albuterol (PROVENTIL) (2.5 MG/3ML) 0.083% nebulizer solution Take 2.5 mg by nebulization every 6 (six) hours as needed for wheezing or shortness of breath.  Marland Kitchen. aspirin EC 325 MG tablet Take 325 mg by mouth daily.  . cetirizine (ZYRTEC) 10 MG tablet Take 10 mg by mouth as needed.  . furosemide (LASIX) 40 MG tablet Take 40 mg by mouth daily.  Marland Kitchen. ibuprofen (ADVIL,MOTRIN) 800 MG tablet TK 1 T PO BID PRN  . losartan-hydrochlorothiazide (HYZAAR) 50-12.5 MG tablet Take 1 tablet by mouth daily.  . potassium chloride (K-DUR) 10 MEQ tablet Take 10 mEq by mouth daily.  Marland Kitchen. UNABLE TO FIND Med Name: CPAP with 3lpm o2  Adapt  . Vitamin D, Ergocalciferol, (DRISDOL) 50000 units CAPS capsule Take 50,000 Units by mouth once a week.         Objective:   Physical Exam  Obese amd bm nad   01/13/2019           307   12/03/2017         398   09/03/17 (!) 306 lb (138.8 kg)  08/03/17 (!) 304 lb (137.9 kg)  06/22/17 (!) 305 lb 11.2 oz (138.7 kg)     Vital signs reviewed - Note on arrival 02 sats  94% on RA    HEENT: nl dentition, turbinates bilaterally, and oropharynx. Nl external ear canals without cough reflex   NECK :  without JVD/Nodes/TM/ nl carotid upstrokes bilaterally   LUNGS: no acc muscle use,  Nl contour chest which is clear to A and P bilaterally without cough on insp or exp maneuvers   CV:  RRR  no s3 or murmur or increase in P2, and no edema   ABD:   Quite obese  soft and nontender with nl inspiratory excursion in the supine position. No bruits or organomegaly appreciated, bowel sounds nl  MS:  Nl gait/ ext warm without deformities, calf tenderness, cyanosis or clubbing No obvious joint restrictions   SKIN: warm and dry without lesions    NEURO:  alert, approp, nl sensorium with  no motor or cerebellar deficits apparent.     CXR PA and Lateral:   01/13/2019 :    I personally reviewed images and agree with radiology impression as follows:   Chronic elevation the right hemidiaphragm with adjacent bandlike opacities likely reflecting atelectasis. No other acute cardiopulmonary abnormality.        .  Assessment:

## 2019-01-13 NOTE — Patient Instructions (Addendum)
Pantoprazole (protonix) 40 mg   Take  30-60 min before first meal of the day and Pepcid (famotidine)  20 mg one after supper supper until return to office - this is the best way to tell whether stomach acid is contributing to your problem.    Plan A = Automatic = Dulera 100 Take 2 puffs first thing in am and then another 2 puffs about 12 hours later.    Work on inhaler technique:  relax and gently blow all the way out then take a nice smooth deep breath back in, triggering the inhaler at same time you start breathing in.  Hold for up to 5 seconds if you can. Blow out thru nose. Rinse and gargle with water when done      Plan B = Backup Only use your albuterol(proair)  inhaler as a rescue medication to be used if you can't catch your breath by resting or doing a relaxed purse lip breathing pattern.  - The less you use it, the better it will work when you need it. - Ok to use the inhaler up to 2 puffs  every 4 hours if you must but call for appointment if use goes up over your usual need - Don't leave home without it !!  (think of it like the spare tire for your car)   Plan C = Crisis - only use your albuterol nebulizer if you first try Plan B and it fails to help > ok to use the nebulizer up to every 4 hours but if start needing it regularly call for immediate appointment     Please remember to go to the  x-ray department  for your tests - we will call you with the results when they are available    Please schedule a follow up office visit in 2  weeks, sooner if needed  with all medications /inhalers/ solutions in hand so we can verify exactly what you are taking. This includes all medications from all doctors and over the counters

## 2019-01-14 ENCOUNTER — Encounter: Payer: Self-pay | Admitting: Internal Medicine

## 2019-01-14 DIAGNOSIS — J45991 Cough variant asthma: Secondary | ICD-10-CM | POA: Insufficient documentation

## 2019-01-14 NOTE — Assessment & Plan Note (Signed)
Onset in childhood Echo 06/21/17 ft ventricle: The cavity size was normal. Wall thickness was   increased in a pattern of mild LVH. Systolic function was   vigorous. The estimated ejection fraction was in the range of 65%   to 70%. Wall motion was normal; there were no regional wall   motion abnormalities. The study is not technically sufficient to   allow evaluation of LV diastolic function. - Mitral valve: There was trivial regurgitation. - Right ventricle: The cavity size was moderately dilated. Systolic   function was moderately to severely reduced. - Right atrium: Central venous pressure (est): 15 mm Hg. - Tricuspid valve: There was trivial regurgitation. - Pulmonary arteries: Systolic pressure could not be accurately   estimated. - Pericardium, extracardiac: There was no pericardial effusion. Spirometry 09/03/2017  FEV1 1.78 (64%)  Ratio 85 s curvature with no saba w/in 4 h  - PFT's  11/19/2017   FEV1 1.93 (69 % ) ratio 82  p 0 % improvement from saba p ? prior to study with DLCO  96 % corrects to 147 % for alv volume   ERV 46  - 12/03/2017   Walked RA  2 laps @ 185 ft each stopped due to  Sob at fast pace, sats 89% at end  - 01/13/2019   [facility] RA  2 laps @  approx 25ft each @ avg pace  stopped due to  End of study, sats 90%  Mild sob    Unable to reproduce his progressive sob and sats better now than one year prior to OV  But response to neb does suggest as asthma component but he is also at risk of pe, chf/ gerd masquerading for asthma so will f/u closely >  F/u with all meds in hand using a trust but verify approach to confirm accurate Medication  Reconciliation The principal here is that until we are certain that the  patients are doing what we've asked, it makes no sense to ask them to do more.

## 2019-01-14 NOTE — Assessment & Plan Note (Addendum)
Body mass index is 48.18 kg/m.  -  trending down / encouraged Lab Results  Component Value Date   TSH 1.41 09/03/2017     Contributing to gerd risk/ doe/reviewed the need and the process to achieve and maintain neg calorie balance > defer f/u primary care including intermittently monitoring thyroid status     I had an extended discussion with the patient   reviewing all relevant studies completed to date and  lasting   25 minutes of a 40  minute acute office visit  which included directly observing ambulatory 02 saturation study documented in a/p section of  today's  office note.  See device teaching which also extended face to face time for this visit   Each maintenance medication was reviewed in detail including most importantly the difference between maintenance and prns and under what circumstances the prns are to be triggered using an action plan format that is not reflected in the computer generated alphabetically organized AVS.     Please see AVS for specific instructions unique to this visit that I personally wrote and verbalized to the the pt in detail and then reviewed with pt  by my nurse highlighting any changes in therapy recommended at today's visit .

## 2019-01-14 NOTE — Assessment & Plan Note (Signed)
Onset ? 11/2018 - 01/13/2019  After extensive coaching inhaler device,  effectiveness =    75% > try dulera 100 2bid x one sample only then regroup in 2 weeks

## 2019-01-14 NOTE — Progress Notes (Signed)
Spoke with pt and notified of results per Dr. Wert. Pt verbalized understanding and denied any questions. 

## 2019-01-14 NOTE — Assessment & Plan Note (Addendum)
HCO3  06/20/17  = 36   Need most recent labs from PCP to check hgb/ hc03 but most likely despite wt loss this is still due to MO

## 2019-01-26 ENCOUNTER — Ambulatory Visit (INDEPENDENT_AMBULATORY_CARE_PROVIDER_SITE_OTHER): Payer: Medicaid Other | Admitting: Internal Medicine

## 2019-01-26 ENCOUNTER — Other Ambulatory Visit: Payer: Self-pay

## 2019-01-26 ENCOUNTER — Encounter: Payer: Self-pay | Admitting: Internal Medicine

## 2019-01-26 DIAGNOSIS — J45991 Cough variant asthma: Secondary | ICD-10-CM | POA: Diagnosis not present

## 2019-01-26 DIAGNOSIS — J9612 Chronic respiratory failure with hypercapnia: Secondary | ICD-10-CM

## 2019-01-26 DIAGNOSIS — J9611 Chronic respiratory failure with hypoxia: Secondary | ICD-10-CM | POA: Diagnosis not present

## 2019-01-26 MED ORDER — AZITHROMYCIN 250 MG PO TABS: ORAL_TABLET | ORAL | 0 refills | Status: DC

## 2019-01-26 MED ORDER — MOMETASONE FURO-FORMOTEROL FUM 100-5 MCG/ACT IN AERO: INHALATION_SPRAY | RESPIRATORY_TRACT | 11 refills | Status: DC

## 2019-01-26 NOTE — Progress Notes (Signed)
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 Pulmonary Consult    Referred by Dr. Ginette OttoAvebuere. Pt states he has always has had SOB and bronchitis since he was a child, but has had increased SOB for the past 3-4 yrs. He states sometimes he gets winded just walking from room to room at home. He is using proair 2 x daily on and albuterol neb 1 x daily on average.   Gradually worse breathing = lifelong and better on 02 / nebs but freq just sob across the room  Doe = MMRC3 = can't walk 100 yards even at a slow pace at a flat grade s stopping due to sob  /02 2lpm  But not while on cpap as the machine is old and no adaptor for it "they said they would call but never did" rec Please see patient coordinator before you leave today  to schedule CPAP titration study> bipap per Craige CottaSood Stop lisinopril and start losartan 50-12.5 and  Your breathing should gradually improve Only use your albuterol (PROAIR) as a rescue med Only use Albuterol neb if you first try the proair    12/03/2017  f/u ov/ re: doe with pfts c/w MO only  Chief Complaint  Patient presents with  . Follow-up    Breathing is overall doing well. He has occ cough in the mornings with light yellow sputum. He uses his albuterol inhaler once per wk on average and has not had to use neb.   Dyspnea:  [facility] can't walk a nl pace on a flat grade s sob but does fine slow and  flat better on port 02 than off  Cough: min am mucus Sleep: fine on bipap and 02 SABA use:  As above, never noct rec Wt loss    01/13/2019 acute extended ov/ re: worse doe  x 2 month/ indolent onset/ gradaully progressive  Chief Complaint  Patient presents with  . Acute Visit    Increased SOB over the past 2 months. He states he gets SOB walking from parking lot to building. He occ has cough with tan sputum.   no prolem sitting /sleeping on cpap and 02  Main issue is doe worse in heat but also at [employer] can't do but 2 aisle then has to stop MMRC3 = can't walk 100 yards even at a slow pace at a flat grade s stopping due to sob   More cough since doe but min prod tan mucus, daytime only  Better p neb last used neb two days prior to OV   No more leg swelling than usual  Does fine on cpap and 02  Had "full labs" one day prior to ov at pcp office hasn't heard any results yet rec Pantoprazole (protonix) 40 mg   Take  30-60 min before first meal of  the day and Pepcid (famotidine)  20 mg one after supper    Plan A = Automatic = Dulera 100 Take 2 puffs first thing in am and then another 2 puffs about 12 hours later.  Work on Garment/textile technologistinhaler technique   Plan B = Backup Only use your albuterol(proair)  inhaler as a rescue medication Plan C = Crisis - only use your albuterol nebulizer if you first try Plan B and it fails to help > ok to use the nebulizer up to every 4 hours but if start needing it regularly call for immediate appointment Please schedule a follow up office visit in 2  weeks, sooner if needed  with all medications   01/26/2019  f/u ov/ re: doe / able to work as Conservation officer, historic buildingsmechanic/ protonix caused diarrhea x 2 days so stopped/ did not report as problem/ still has 15 extra doses in dulera where should be 4-5  Chief Complaint  Patient presents with  . Follow-up    Breathing better since the last visit.   much less need for albuterol  Dyspnea:  MMRC3 = can't walk 100 yards even at a slow  pace at a flat grade s stopping due to sob/ no chest tightness  Cough: brownish mucus esp in am  Sleeping: fine on cpap and 02 3lpm  SABA use: none now  02: only at hs   No obvious patterns  day to day or daytime variability or assoc  or mucus plugs or hemoptysis or cp or chest tightness, subjective wheeze or overt sinus or hb symptoms.   Sleeping as above without nocturnal  or early am exacerbation  of respiratory  c/o's or need for noct saba. Also denies any obvious fluctuation of symptoms with weather or environmental changes or other aggravating or alleviating factors except as outlined above   No unusual exposure hx or h/o childhood pna/ asthma or knowledge of premature birth.  Current Allergies, Complete Past Medical History, Past Surgical History, Family History, and Social History were [facility].  ROS  The following are not active complaints unless bolded Hoarseness, sore throat, dysphagia, dental problems, itching, sneezing,  nasal congestion or discharge of excess mucus or purulent secretions, ear ache,   fever, chills, sweats, unintended wt loss or wt gain, classically pleuritic or exertional cp,  orthopnea pnd or arm/hand swelling  or leg swelling, presyncope, palpitations, abdominal pain, anorexia, nausea, vomiting, diarrhea  or change in bowel habits or change in bladder habits, change in stools or change in urine, dysuria, hematuria,  rash, arthralgias, visual complaints, headache, numbness, weakness or ataxia or problems with walking or coordination,  change in mood or  memory.        Current Meds  Medication Sig  . albuterol (PROAIR HFA) 108 (90 Base) MCG/ACT inhaler Inhale 2 puffs into the lungs 4 (four) times daily as needed for wheezing or shortness of breath.  Marland Kitchen. albuterol (PROVENTIL) (2.5 MG/3ML) 0.083% nebulizer solution Take 2.5 mg by nebulization every 6 (six) hours as needed for wheezing or shortness of breath.  Marland Kitchen. aspirin EC  325 MG tablet Take 325 mg by mouth daily.  . cetirizine (ZYRTEC) 10 MG tablet Take 10 mg by mouth as needed.  . famotidine (PEPCID) 20 MG tablet One after supper  . furosemide (LASIX) 40 MG tablet Take 40 mg by mouth daily.  Marland Kitchen. ibuprofen (ADVIL,MOTRIN) 800 MG tablet TK 1 T PO BID PRN  . losartan-hydrochlorothiazide (HYZAAR) 50-12.5 MG tablet Take 1 tablet by  mouth daily.  . mometasone-formoterol (DULERA) 100-5 MCG/ACT AERO Inhale 2 puffs into the lungs 2 (two) times a day.  .     . potassium chloride (K-DUR) 10 MEQ tablet Take 10 mEq by mouth daily.  Marland Kitchen UNABLE TO FIND Med Name: CPAP with 3lpm o2  [employer]  . Vitamin D, Ergocalciferol, (DRISDOL) 50000 units CAPS capsule Take 50,000 Units by mouth once a week.         Objective:   Physical Exam  Obese amb bm nad   01/26/2019         309  01/13/2019           307   12/03/2017        398   09/03/17 (!) 306 lb (138.8 kg)  08/03/17 (!) 304 lb (137.9 kg)  06/22/17 (!) 305 lb 11.2 oz (138.7 kg)      HEENT: nl dentition, turbinates bilaterally, and oropharynx. Nl external ear canals without cough reflex   NECK :  without JVD/Nodes/TM/ nl carotid upstrokes bilaterally   LUNGS: no acc muscle use,  Nl contour chest which is clear to A and P bilaterally without cough on insp or exp maneuvers   CV:  RRR  no s3 or murmur or increase in P2, and no edema   ABD:  Massively obese but  nontender with nl inspiratory excursion in the supine position. No bruits or organomegaly appreciated, bowel sounds nl  MS:  Nl gait/ ext warm without deformities, calf tenderness, cyanosis or clubbing No obvious joint restrictions   SKIN: warm and dry without lesions    NEURO:  alert, approp, nl sensorium with  no motor or cerebellar deficits apparent.  .  Assessment:

## 2019-01-26 NOTE — Patient Instructions (Signed)
zpak   Continue dulera 100 (symbicort 80) Take 2 puffs first thing in am and then another 2 puffs about 12 hours later.   Please schedule a follow up office visit in 6 weeks, call sooner if needed

## 2019-01-27 ENCOUNTER — Encounter: Payer: Self-pay | Admitting: Internal Medicine

## 2019-01-27 NOTE — Assessment & Plan Note (Signed)
Onset ? 11/2018 - 01/13/2019   > try dulera 100 2bid > improved - 01/26/2019  After extensive coaching inhaler device,  effectiveness =    75% > continue dulera 100 or symb 80 2bid  He has mild assoc bronchitis rx zpak and continue either of the above x 3 m rx then regroup   Advised:  formulary restrictions will be an ongoing challenge for the forseable future and I would be happy to pick an alternative if the pt will first  provide me a list of them -  pt  will need to return here for training for any new device that is required eg dpi vs hfa vs respimat.    In the meantime we can always provide samples so that the patient never runs out of any needed respiratory medications.   I had an extended discussion with the patient reviewing all relevant studies completed to date and  lasting 15 to 20 minutes of a 25 minute visit    I performed detailed device teaching using a teach back method which extended face to face time for this visit (see above)  Each maintenance medication was reviewed in detail including emphasizing most importantly the difference between maintenance and prns and under what circumstances the prns are to be triggered using an action plan format that is not reflected in the computer generated alphabetically organized AVS which I have not found useful in most complex patients, especially with respiratory illnesses  Please see AVS for specific instructions unique to this visit that I personally wrote and verbalized to the the pt in detail and then reviewed with pt  by my nurse highlighting any  changes in therapy recommended at today's visit to their plan of care.

## 2019-01-27 NOTE — Assessment & Plan Note (Signed)
HCO3  06/20/17  = 36  HCO3  01/12/2019  = 26   Improved hc03 c/w resolving hypercarbic component on cpap/02 hs per PCP  >>> Adequate control on present rx, reviewed in detail with pt > no change in rx needed

## 2019-02-10 ENCOUNTER — Ambulatory Visit (INDEPENDENT_AMBULATORY_CARE_PROVIDER_SITE_OTHER): Payer: Medicaid Other | Admitting: Pulmonary Disease

## 2019-02-10 ENCOUNTER — Other Ambulatory Visit: Payer: Self-pay

## 2019-02-10 ENCOUNTER — Encounter: Payer: Self-pay | Admitting: Pulmonary Disease

## 2019-02-10 VITALS — BP 136/80 | HR 88 | Temp 98.0°F | Ht 68.0 in | Wt 308.4 lb

## 2019-02-10 DIAGNOSIS — J9612 Chronic respiratory failure with hypercapnia: Secondary | ICD-10-CM | POA: Diagnosis not present

## 2019-02-10 DIAGNOSIS — E662 Morbid (severe) obesity with alveolar hypoventilation: Secondary | ICD-10-CM

## 2019-02-10 DIAGNOSIS — G4733 Obstructive sleep apnea (adult) (pediatric): Secondary | ICD-10-CM | POA: Diagnosis not present

## 2019-02-10 DIAGNOSIS — J9611 Chronic respiratory failure with hypoxia: Secondary | ICD-10-CM | POA: Diagnosis not present

## 2019-02-10 NOTE — Patient Instructions (Signed)
Follow up in 1 year.

## 2019-02-10 NOTE — Progress Notes (Signed)
[facility]  Chief Complaint  Patient presents with  . Follow-up    DME [facility], osa follow up, states he is winded easily with heat,using albuterol inhaler twice a day    Constitutional:  BP 136/80 (BP Location: Right Arm, Cuff Size: Large)   Pulse 88   Temp 98 F (36.7 C) (Oral)   Ht 5\' 8"  (1.727 m)   Wt (!) 308 lb 6.4 oz (139.9 kg)   SpO2 95%   BMI 46.89 kg/m   Past Medical History:  HTN, Gout, GERD, Depression, OA, Anxiety  Brief Summary:  Kurt [name] is a 58 y.o. male with obstructive sleep apnea, obesity hypoventilation, and chronic respiratory failure.  Uses Bipap nightly.  No issues with mask fit.  Uses 3 liters O2 at night.  Not having sinus congestion, sore throat, dry mouth.  Trying to adjust his diet to work on weight loss.  He is worried about how long he will be able to work.  Works as a Dealer.  Exposures at work are making it more difficulty for his breathing.  Also has trouble in hot weather.  Physical Exam:   Appearance - well kempt   ENMT - clear nasal mucosa, midline nasal  septum, no oral exudates, no LAN, trachea midline  Respiratory - normal chest wall, normal respiratory effort, no accessory muscle use, no wheeze/rales  CV - s1s2 regular rate and rhythm, no murmurs, no peripheral edema, radial pulses symmetric  GI - soft, non tender, no masses  Lymph - no adenopathy noted in neck and axillary areas  MSK - normal gait  Ext - no cyanosis, clubbing, or joint inflammation noted  Skin - no rashes, lesions, or ulcers  Neuro - normal strength, oriented x 3  Psych - normal mood and affect   Assessment/Plan:   Obstructive sleep apnea. - he is compliant with Bipap and reports benefit - continue Bipap 12/8 cm H2O  Obesity hypoventilation syndrome. - 3 liters at night with Bipap - discussed importance of weight loss and reviewed options to assist with this  Chronic bronchitis. - followed by Dr.  Melvyn Novas for this   Patient Instructions  Follow up in 1 year    Chesley Mires, MD [employer] Pager: [phone] 02/10/2019, 12:33 PM  Flow Sheet     Pulmonary tests:  PFT 11/19/17 >> FEV1 1.96 (70%), FEV1% 82, TLC 4.32 (69%), DLCO 96%  Sleep tests:  PSG 08/03/17 >> AHI 108.1 Bipap 09/22/17 >> Bipap 19/15 cm H2O with 3 liters O2 Bipap 01/10/19 to 02/08/19 >> used on 29 of 30 nights with average 5 hrs 46 min.  Average AHI 5.3 with Bipap 12/8 cm H2O  Medications:   Allergies as of 02/10/2019      Reactions   Penicillins Itching   Has patient had a PCN reaction causing immediate rash, facial/tongue/throat swelling, SOB or lightheadedness with hypotension: No Has patient had a PCN reaction causing severe rash involving mucus membranes or skin necrosis: No Has patient had a PCN reaction that required hospitalization: No Has patient had a PCN reaction occurring within the last 10 years: No If all of the above answers are "NO", then may proceed with Cephalosporin use.      Medication List       Accurate as of February 10, 2019 12:33 PM. If you have any questions, ask your nurse or doctor.        STOP taking these medications   azithromycin 250 MG tablet  Commonly known as: ZITHROMAX Stopped by: Coralyn HellingVineet Saxon Barich, MD     TAKE these medications   albuterol (2.5 MG/3ML) 0.083% nebulizer solution Commonly known as: PROVENTIL Take 2.5 mg by nebulization every 6 (six) hours as needed for wheezing or shortness of breath.   albuterol 108 (90 Base) MCG/ACT inhaler Commonly known as: ProAir HFA Inhale 2 puffs into the lungs 4 (four) times daily as needed for wheezing or shortness of breath.   aspirin EC 325 MG tablet Take 325 mg by mouth daily.   cetirizine 10 MG tablet Commonly known as: ZYRTEC Take 10 mg by mouth as needed.   famotidine 20 MG tablet Commonly known as: Pepcid One after supper   fluticasone 50 MCG/ACT nasal spray Commonly known as: FLONASE Place 1  spray into both nostrils daily for 14 days.   furosemide 40 MG tablet Commonly known as: LASIX Take 40 mg by mouth daily.   ibuprofen 800 MG tablet Commonly known as: ADVIL TK 1 T PO BID PRN   losartan-hydrochlorothiazide 50-12.5 MG tablet Commonly known as: HYZAAR Take 1 tablet by mouth daily.   mometasone-formoterol 100-5 MCG/ACT Aero Commonly known as: DULERA Take 2 puffs first thing in am and then another 2 puffs about 12 hours later.   potassium chloride 10 MEQ tablet Commonly known as: K-DUR Take 10 mEq by mouth daily.   UNABLE TO FIND Med Name: CPAP with 3lpm o2  [employer]   Vitamin D (Ergocalciferol) 1.25 MG (50000 UT) Caps capsule Commonly known as: DRISDOL Take 50,000 Units by mouth once a week.       Past Surgical History:  He  has a past surgical history that includes No past surgeries.  Family History:  His family history includes Hypertension in his mother.  Social History:  He  reports that he quit smoking about 12 years ago. His smoking use included cigarettes. He has a 30.00 pack-year smoking history. He has never used smokeless tobacco. He reports that he does not drink alcohol or use drugs.

## 2019-03-09 ENCOUNTER — Ambulatory Visit (INDEPENDENT_AMBULATORY_CARE_PROVIDER_SITE_OTHER): Payer: Medicaid Other | Admitting: Internal Medicine

## 2019-03-09 ENCOUNTER — Other Ambulatory Visit: Payer: Self-pay

## 2019-03-09 ENCOUNTER — Encounter: Payer: Self-pay | Admitting: Internal Medicine

## 2019-03-09 ENCOUNTER — Telehealth: Payer: Self-pay | Admitting: Internal Medicine

## 2019-03-09 DIAGNOSIS — J9612 Chronic respiratory failure with hypercapnia: Secondary | ICD-10-CM

## 2019-03-09 DIAGNOSIS — J45991 Cough variant asthma: Secondary | ICD-10-CM

## 2019-03-09 DIAGNOSIS — J9611 Chronic respiratory failure with hypoxia: Secondary | ICD-10-CM | POA: Diagnosis not present

## 2019-03-09 MED ORDER — BUDESONIDE-FORMOTEROL FUMARATE 80-4.5 MCG/ACT IN AERO: 2.0000 | INHALATION_SPRAY | Freq: Two times a day (BID) | RESPIRATORY_TRACT | 0 refills | Status: DC

## 2019-03-09 MED ORDER — BUDESONIDE-FORMOTEROL FUMARATE 80-4.5 MCG/ACT IN AERO: 2.0000 | INHALATION_SPRAY | Freq: Two times a day (BID) | RESPIRATORY_TRACT | 11 refills | Status: DC

## 2019-03-09 NOTE — Patient Instructions (Addendum)
Plan A = Automatic = symbicort 80 Take 2 puffs first thing in am and then another 2 puffs about 12 hours later.   Work on inhaler technique:  relax and gently blow all the way out then take a nice smooth deep breath back in, triggering the inhaler at same time you start breathing in.  Hold for up to 5 seconds if you can. Blow out thru nose. Rinse and gargle with water when done      Plan B = Backup Only use your albuterol inhaler as a rescue medication to be used if you can't catch your breath by resting or doing a relaxed purse lip breathing pattern.  - The less you use it, the better it will work when you need it. - Ok to use the inhaler up to 2 puffs  every 4 hours if you must but call for appointment if use goes up over your usual need - Don't leave home without it !!  (think of it like the spare tire for your car)   Plan C = Crisis - only use your albuterol nebulizer if you first try Plan B and it fails to help > ok to use the nebulizer up to every 4 hours but if start needing it regularly call for immediate appointment  Walk up 30 min but make sure your have 02 over 90% - pace yourself  Please schedule a follow up office visit in 6 weeks, call sooner if needed with all medications /inhalers/ solutions in hand so we can verify exactly what you are taking. This includes all medications from all doctors and over the counters

## 2019-03-09 NOTE — Telephone Encounter (Signed)
Attempted to return call to patient. No answer and voicemail has not been set up - unable to leave message.

## 2019-03-09 NOTE — Telephone Encounter (Signed)
Pt returning call and can be reached @ [phone].Kurt [name]

## 2019-03-09 NOTE — Progress Notes (Signed)
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 Pulmonary Consult    Referred by Dr. Ginette OttoAvebuere. Pt states he has always has had SOB and bronchitis since he was a child, but has had increased SOB for the past 3-4 yrs. He states sometimes he gets winded just walking from room to room at home. He is using proair 2 x daily on and albuterol neb 1 x daily on average.   Gradually worse breathing = lifelong and better on 02 / nebs but freq just sob across the room  Doe = MMRC3 = can't walk 100 yards even at a slow pace at a flat grade s stopping due to sob  /02 2lpm  But not while on cpap as the machine is old and no adaptor for it "they said they would call but never did" rec Please see patient coordinator before you leave today  to schedule CPAP titration study> bipap per Craige CottaSood Stop lisinopril and start losartan 50-12.5 and  Your breathing should gradually improve Only use your albuterol (PROAIR) as a rescue med Only use Albuterol neb if you first try the proair    12/03/2017  f/u ov/Kurt [name] re: doe with pfts c/w MO only  Chief Complaint  Patient presents with  . Follow-up    Breathing is overall doing well. He has occ cough in the mornings with light yellow sputum. He uses his albuterol inhaler once per wk on average and has not had to use neb.   Dyspnea:  [facility] = can't walk a nl pace on a flat grade s sob but does fine slow  and flat better on port 02 than off  Cough: min am mucus Sleep: fine on bipap and 02 SABA use:  As above, never noct rec Wt loss    01/13/2019 acute extended ov/Kurt [name] re: worse doe  x 2 month/ indolent onset/ gradaully progressive  Chief Complaint  Patient presents with  . Acute Visit    Increased SOB over the past 2 months. He states he gets SOB walking from parking lot to building. He occ has cough with tan sputum.   no prolem sitting /sleeping on cpap and 02  Main issue is doe worse in heat but also at [employer] can't do but 2 aisle then has to stop MMRC3 = can't walk 100 yards even at a slow pace at a flat grade s stopping due to sob   More cough since doe but min prod tan mucus, daytime only  Better p neb last used neb two days prior to OV   No more leg swelling than usual  Does fine on cpap and 02  Had "full labs" one day prior to ov at pcp office hasn't heard any results yet rec Pantoprazole (protonix) 40 mg   Take  30-60 min before first  meal of the day and Pepcid (famotidine)  20 mg one after supper    Plan A = Automatic = Dulera 100 Take 2 puffs first thing in am and then another 2 puffs about 12 hours later.  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 Follow-up    Breathing better since the last visit.   much less need for albuterol  Dyspnea:  MMRC3 = can't walk 100 yards even at a  slow pace at a flat grade s stopping due to sob/ no chest tightness  Cough: brownish mucus esp in am  Sleeping: fine on cpap and 02 3lpm  SABA use: none now  02: only at hs  rec zpak  Continue dulera 100 (symbicort 80) Take 2 puffs first thing in am and then another 2 puffs about 12 hours later.      03/09/2019  f/u ov/Kurt [name] re: sob related to asthma and obesity/ did not bring all meds   Chief Complaint  Patient presents with  . Follow-up    Breathing has not improved. He is using his albuterol inhaler 4 x per day and neb about 4 x per wk. He had cough this am with blood specks in it.   Dyspnea:  Walks on weekends x 5 min flat nl pace = MMRC3 = can't walk 100 yards even at a slow pace at a flat grade s stopping due to sob   Cough: minimal in am /better when on  on dulera/ mostly mucoid no significant blood   Sleeping: on back /on cpap sood  SABA use: as above - much more when out of laba/ics 02: 3lpm hs and does not use during the day at work he says  due to fire hazzard    No obvious day to day or daytime variability or assoc excess/ purulent sputum or mucus plugs or hemoptysis or cp or chest tightness, subjective wheeze or overt sinus or hb symptoms.   sleepoing as above  without nocturnal  or early am exacerbation  of respiratory  c/o's or need for noct saba. Also denies any obvious fluctuation of symptoms with weather or environmental changes or other aggravating or alleviating factors except as outlined above   No unusual exposure hx or h/o childhood pna/ asthma or knowledge of premature birth.  Current Allergies, Complete Past Medical History, Past Surgical History, Family History, and Social History were reviewed in [employer] record.  ROS  The following are not active complaints unless bolded Hoarseness, sore throat, dysphagia, dental problems, itching, sneezing,  nasal congestion or discharge of excess mucus or purulent secretions, ear ache,   fever,  chills, sweats, unintended wt loss or wt gain, classically pleuritic or exertional cp,  orthopnea pnd or arm/hand swelling  or leg swelling bilateral/dep, presyncope, palpitations, abdominal pain, anorexia,  nausea, vomiting, diarrhea  or change in bowel habits or change in bladder habits, change in stools or change in urine, dysuria, hematuria,  rash, arthralgias, visual complaints, headache, numbness, weakness or ataxia or problems with walking or coordination,  change in mood or  memory.        Current Meds  Medication Sig  . albuterol (PROAIR HFA) 108 (90 Base) MCG/ACT inhaler Inhale 2 puffs into the lungs 4 (four) times daily as needed for wheezing or shortness of breath.  Marland Kitchen albuterol (PROVENTIL) (2.5 MG/3ML) 0.083% nebulizer solution Take 2.5 mg by nebulization every 6 (six) hours as needed for wheezing or shortness of breath.  Marland Kitchen aspirin EC 325 MG tablet Take 325 mg by mouth daily.  . cetirizine (ZYRTEC) 10 MG tablet Take 10 mg by mouth as needed.  . famotidine (PEPCID) 20 MG tablet One after supper  . furosemide (LASIX) 40 MG tablet Take 40 mg by mouth daily.  Marland Kitchen ibuprofen (ADVIL,MOTRIN) 800 MG tablet TK 1 T PO BID PRN  . losartan-hydrochlorothiazide (HYZAAR) 50-12.5 MG tablet Take 1 tablet by mouth daily.  . potassium chloride (K-DUR) 10 MEQ tablet Take 10 mEq by mouth daily.  Marland Kitchen UNABLE TO FIND Med Name: CPAP with 3lpm o2  Adapt  . Vitamin D, Ergocalciferol, (DRISDOL) 50000 units CAPS capsule Take 50,000 Units by mouth once a week.               Objective:   Physical Exam    03/09/2019             312  01/26/2019         309  01/13/2019           307   12/03/2017        398   09/03/17 (!) 306 lb (138.8 kg)  08/03/17 (!) 304 lb (137.9 kg)  06/22/17 (!) 305 lb 11.2 oz (138.7 kg)     Vital signs reviewed - Note on arrival 02 sats  95% on RA    massively obese bm nad  HEENT : pt wearing mask not removed for exam due to covid - 19 concerns.   NECK :  without JVD/Nodes/TM/ nl  carotid upstrokes bilaterally   LUNGS: no acc muscle use,  Nl contour chest which is clear to A and P bilaterally without cough on insp or exp maneuvers   CV:  RRR  no s3 or murmur or increase in P2, and trace pitting bilateral le Edema  ABD:  soft and nontender with nl inspiratory excursion in the supine position. No bruits or organomegaly appreciated, bowel sounds nl  MS:  Nl gait/ ext warm without deformities, calf tenderness, cyanosis or clubbing No obvious joint restrictions   SKIN: warm and dry without lesions    NEURO:  alert, approp, nl sensorium with  no motor or cerebellar deficits apparent.          Assessment:

## 2019-03-09 NOTE — Telephone Encounter (Signed)
Please schedule patient a visit either in office or via phone.   Attempted to contact, voicemail has not been set up.

## 2019-03-11 NOTE — Telephone Encounter (Signed)
Attempted to call to schedule televisit.  No answer.  Voicemail not set up.

## 2019-03-12 ENCOUNTER — Encounter: Payer: Self-pay | Admitting: Internal Medicine

## 2019-03-12 NOTE — Assessment & Plan Note (Addendum)
Onset ? 11/2018 - 01/13/2019   > try dulera 100 2bid  - 01/26/2019     continue dulera 100 or symb 80 2bid - 03/09/2019  After extensive coaching inhaler device,  effectiveness =    75% (delay inspiration/ short ti ) > continue symb 80 2bid   He does report doing better when has access to a maintenance inhaler with less need for rescue suggestive of asthma although I still think he overuses albuterol for dyspnea related to obesity and conditioning not directly related to asthma.  I therefore recommended he get back on Symbicort at 2 puffs every 12 hours and reduce albuterol to the least amount possible using the following strategy:  I spent extra time with pt today reviewing appropriate use of albuterol for prn use on exertion with the following points: 1) saba is for relief of sob that does not improve by walking a slower pace or resting but rather if the pt does not improve after trying this first. 2) If the pt is convinced, as many are, that saba helps recover from activity faster then it's easy to tell if this is the case by re-challenging : ie stop, take the inhaler, then p 5 minutes try the exact same activity (intensity of workload) that just caused the symptoms and see if they are substantially diminished or not after saba 3) if there is an activity that reproducibly causes the symptoms, try the saba 15 min before the activity on alternate days   If in fact the saba really does help, then fine to continue to use it prn but advised may need to look closer at the maintenance regimen being used to achieve better control of airways disease with exertion.    >>>f/u in 6 weeks with all meds in hand using a trust but verify approach to confirm accurate Medication  Reconciliation The principal here is that until we are certain that the  patients are doing what we've asked, it makes no sense to ask them to do more.

## 2019-03-12 NOTE — Assessment & Plan Note (Signed)
Body mass index is 47.44 kg/m.  -  trending up  Lab Results  Component Value Date   TSH 1.41 09/03/2017     Contributing to osa/ doe/reviewed the need and the process to achieve and maintain neg calorie balance > defer f/u primary care including intermittently monitoring thyroid status   I had an extended discussion with the patient reviewing all relevant studies completed to date and  lasting 15 to 20 minutes of a 25 minute visit    I performed detailed device teaching using a teach back method which extended face to face time for this visit (see above)  Each maintenance medication was reviewed in detail including emphasizing most importantly the difference between maintenance and prns and under what circumstances the prns are to be triggered using an action plan format that is not reflected in the computer generated alphabetically organized AVS which I have not found useful in most complex patients, especially with respiratory illnesses  Please see AVS for specific instructions unique to this visit that I personally wrote and verbalized to the the pt in detail and then reviewed with pt  by my nurse highlighting any  changes in therapy recommended at today's visit to their plan of care.

## 2019-03-12 NOTE — Telephone Encounter (Signed)
ATC pt, there was no answer and I could not leave a message due to his voicemail being set up. Will try back due to the nature of the message.

## 2019-03-12 NOTE — Assessment & Plan Note (Signed)
HCO3  06/20/17  = 36  HCO3  01/12/2019  = 26  - 03/09/2019   Walked RA  2 laps @  approx 277ft each @ moderate pace  stopped due to  End of study, mild sob with sats still  90%    As of 03/09/2019 rec 02 3lpm hs with cpap only

## 2019-03-16 NOTE — Telephone Encounter (Signed)
Called patient, he did not answer and VM is still not setup.   Will close this encounter.

## 2019-04-09 ENCOUNTER — Other Ambulatory Visit: Payer: Self-pay | Admitting: Internal Medicine

## 2019-04-20 ENCOUNTER — Encounter: Payer: Self-pay | Admitting: Internal Medicine

## 2019-04-20 ENCOUNTER — Ambulatory Visit (INDEPENDENT_AMBULATORY_CARE_PROVIDER_SITE_OTHER): Payer: Medicaid Other | Admitting: Internal Medicine

## 2019-04-20 ENCOUNTER — Other Ambulatory Visit: Payer: Self-pay

## 2019-04-20 DIAGNOSIS — J9612 Chronic respiratory failure with hypercapnia: Secondary | ICD-10-CM | POA: Diagnosis not present

## 2019-04-20 DIAGNOSIS — J9611 Chronic respiratory failure with hypoxia: Secondary | ICD-10-CM | POA: Diagnosis not present

## 2019-04-20 DIAGNOSIS — J45991 Cough variant asthma: Secondary | ICD-10-CM

## 2019-04-20 NOTE — Assessment & Plan Note (Signed)
Body mass index is 47.74 kg/m.  -  trending up  Lab Results  Component Value Date   TSH 1.41 09/03/2017     Contributing to gerd risk/ doe/reviewed the need and the process to achieve and maintain neg calorie balance > defer f/u primary care including intermittently monitoring thyroid status     I had an extended discussion with the patient reviewing all relevant studies completed to date and  lasting 15 to 20 minutes of a 25 minute visit    I performed detailed device teaching using a teach back method which extended face to face time for this visit (see above)  Each maintenance medication was reviewed in detail including emphasizing most importantly the difference between maintenance and prns and under what circumstances the prns are to be triggered using an action plan format that is not reflected in the computer generated alphabetically organized AVS which I have not found useful in most complex patients, especially with respiratory illnesses  Please see AVS for specific instructions unique to this visit that I personally wrote and verbalized to the the pt in detail and then reviewed with pt  by my nurse highlighting any  changes in therapy recommended at today's visit to their plan of care.

## 2019-04-20 NOTE — Progress Notes (Signed)
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 Pulmonary Consult    Referred by Kurt. Ginette [name]. Pt states he has always has had SOB and bronchitis since he was a child, but has had increased SOB for the past 3-4 yrs. He states sometimes he gets winded just walking from room to room at home. He is using proair 2 x daily on and albuterol neb 1 x daily on average.   Gradually worse breathing = lifelong and better on 02 / nebs but freq just sob across the room  Doe = MMRC3 = can't walk 100 yards even at a slow pace at a flat grade s stopping due to sob  /02 2lpm  But not while on cpap as the machine is old and no adaptor for it "they said they would call but never did" rec Please see patient coordinator before you leave today  to schedule CPAP titration study> bipap per Craige CottaSood Stop lisinopril and start losartan 50-12.5 and  Your breathing should gradually improve Only use your albuterol (PROAIR) as a rescue med Only use Albuterol neb if you first try the proair    12/03/2017  f/u ov/Kurt [name] re: doe with pfts c/w MO only  Chief Complaint  Patient presents with  . Follow-up    Breathing is overall doing well. He has occ cough in the mornings with light yellow sputum. He uses his albuterol inhaler once per wk on average and has not had to use neb.   Dyspnea:  [facility] = can't walk a nl pace on a flat grade s sob but does fine slow  and flat better on port 02 than off  Cough: min am mucus Sleep: fine on bipap and 02 SABA use:  As above, never noct rec Wt loss    01/13/2019 acute extended ov/Kurt [name] re: worse doe  x 2 month/ indolent onset/ gradaully progressive  Chief Complaint  Patient presents with  . Acute Visit    Increased SOB over the past 2 months. He states he gets SOB walking from parking lot to building. He occ has cough with tan sputum.   no prolem sitting /sleeping on cpap and 02  Main issue is doe worse in heat but also at [employer] can't do but 2 aisle then has to stop MMRC3 = can't walk 100 yards even at a slow pace at a flat grade s stopping due to sob   More cough since doe but min prod tan mucus, daytime only  Better p neb last used neb two days prior to OV   No more leg swelling than usual  Does fine on cpap and 02  Had "full labs" one day prior to ov at pcp office hasn't heard any results yet rec Pantoprazole (protonix) 40 mg   Take  30-60 min before first  meal of the day and Pepcid (famotidine)  20 mg one after supper    Plan A = Automatic = Dulera 100 Take 2 puffs first thing in am and then another 2 puffs about 12 hours later.  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 Follow-up    Breathing better since the last visit.   much less need for albuterol  Dyspnea:  MMRC3 = can't walk 100 yards even at a  slow pace at a flat grade s stopping due to sob/ no chest tightness  Cough: brownish mucus esp in am  Sleeping: fine on cpap and 02 3lpm  SABA use: none now  02: only at hs  rec zpak  Continue dulera 100 (symbicort 80) Take 2 puffs first thing in am and then another 2 puffs about 12 hours later.      03/09/2019  f/u ov/Kurt [name] re: sob related to asthma and obesity/ did not bring all meds   Chief Complaint  Patient presents with  . Follow-up    Breathing has not improved. He is using his albuterol inhaler 4 x per day and neb about 4 x per wk. He had cough this am with blood specks in it.   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  Work on inhaler technique:    Plan B = Backup Only use your albuterol inhaler  Plan C = Crisis - only use your albuterol nebulizer if you first try Plan B Walk up 30 min but make sure your have 02 over 90% - pace yourself    04/20/2019  f/u ov/Kurt [name] re: asthma/ obesity   Chief Complaint  Patient presents with  . Follow-up    Breathing is about the same. He is using his albuterol inhaler 5 x per wk on average and he has not used his neb since the last visit.   Dyspnea:  MMRC2 = can't walk a nl pace on a flat grade s sob but does fine slow and flat  Cough: none  Sleeping: sleeps on back/ on cpap, two pillows SABA use: up to once a day, just the inhaler, not the neb 02: 3lpm hs on cpap    No  obvious day to day or daytime variability or assoc excess/ purulent sputum or mucus plugs or hemoptysis or  cp or chest tightness, subjective wheeze or overt sinus or hb symptoms.   Sleeping as above  without nocturnal  or early am exacerbation  of respiratory  c/o's or need for noct saba. Also denies any obvious fluctuation of symptoms with weather or environmental changes or other aggravating or alleviating factors except as outlined above   No unusual exposure hx or h/o childhood pna    knowledge of premature birth.  Current Allergies, Complete Past Medical History, Past Surgical History, Family History, and Social History were reviewed in [employer] record.  ROS  The following are not active complaints unless bolded Hoarseness, sore throat, dysphagia, dental problems, itching, sneezing,  nasal congestion or discharge of excess mucus or purulent secretions, ear ache,   fever, chills, sweats, unintended wt loss or wt gain, classically pleuritic or exertional cp,  orthopnea pnd or arm/hand swelling  or leg swelling, presyncope, palpitations, abdominal pain, anorexia, nausea, vomiting, diarrhea  or change in bowel habits or change in bladder habits, change in stools or change in urine, dysuria, hematuria,  rash, arthralgias, visual complaints, headache, numbness, weakness or ataxia or problems with walking or coordination,  change in mood or  memory.        Current Meds  Medication Sig  . albuterol (PROAIR HFA) 108 (90 Base) MCG/ACT inhaler Inhale 2 puffs into the lungs 4 (four) times daily as needed for wheezing or shortness of breath.  Marland Kitchen albuterol (PROVENTIL) (2.5 MG/3ML) 0.083% nebulizer solution Take 2.5 mg by nebulization every 6 (six) hours as needed for wheezing or shortness of breath.  Marland Kitchen aspirin EC 325 MG tablet Take 325 mg by mouth daily.  . budesonide-formoterol (SYMBICORT) 80-4.5 MCG/ACT inhaler Inhale 2 puffs into the lungs 2 (two) times daily.  . cetirizine (ZYRTEC) 10 MG tablet Take 10 mg by mouth as needed.  . Chlorphen-PE-Acetaminophen (NOREL AD) 4-10-325 MG TABS  Take by mouth 3 times/day as needed-between meals & bedtime.  . famotidine (PEPCID) 20 MG tablet One after supper  . fluticasone (FLONASE) 50 MCG/ACT nasal spray Place 1 spray into both nostrils daily for 14 days.  . furosemide (LASIX) 40 MG tablet Take 40 mg by mouth daily.  Marland Kitchen ibuprofen (ADVIL,MOTRIN) 800 MG tablet TK 1 T PO BID PRN  . losartan-hydrochlorothiazide (HYZAAR) 50-12.5 MG tablet TAKE 1 TABLET BY MOUTH EVERY DAY  . MITIGARE 0.6 MG CAPS Take 1 capsule by mouth 2 (two) times daily as needed.  . pantoprazole (PROTONIX) 40 MG tablet Take 40 mg by mouth daily before breakfast.  . Phenyleph-CPM-DM-APAP (ALKA-SELTZER PLUS COLD & COUGH) 11-06-08-325 MG CAPS Take by mouth as needed.  . potassium chloride (K-DUR) 10 MEQ tablet Take 10 mEq by mouth daily.  Marland Kitchen tiZANidine (ZANAFLEX) 4 MG tablet Take 4 mg by mouth every 6 (six) hours as needed for muscle spasms.  Marland Kitchen UNABLE TO FIND Med Name: CPAP with 3lpm o2  Adapt  . Vitamin D, Ergocalciferol, (DRISDOL) 50000 units CAPS capsule Take 50,000 Units by mouth once a week.               Objective:   Physical Exam  04/20/2019          314  03/09/2019             312  01/26/2019         309  01/13/2019  307   12/03/2017        398   09/03/17 (!) 306 lb (138.8 kg)  08/03/17 (!) 304 lb (137.9 kg)  06/22/17 (!) 305 lb 11.2 oz (138.7 kg)     Vital signs reviewed - Note on arrival 02 sats  95% on RA   Obese bm nad     HEENT : pt wearing mask not removed for exam due to covid -19 concerns.    NECK :  without JVD/Nodes/TM/ nl carotid upstrokes bilaterally   LUNGS: no acc muscle use,  Nl contour chest which is clear to A and P bilaterally without cough on insp or exp maneuvers   CV:  RRR  no s3 or murmur or increase in P2, and trace pitting edema  both ankles.  ABD:  Massively obese but soft and nontender with nl inspiratory excursion in the supine position. No bruits or organomegaly appreciated, bowel sounds nl  MS:  Nl gait/ ext  warm without deformities, calf tenderness, cyanosis or clubbing No obvious joint restrictions   SKIN: warm and dry without lesions    NEURO:  alert, approp, nl sensorium with  no motor or cerebellar deficits apparent.       Assessment:

## 2019-04-20 NOTE — Patient Instructions (Signed)
Work on inhaler technique:  relax and gently blow all the way out then take a nice smooth deep breath back in, triggering the inhaler at same time you start breathing in.  Hold for up to 5 seconds if you can. Blow out thru nose. Rinse and gargle with water when done       To get the most out of exercise, you need to be continuously aware that you are short of breath, but never out of breath, for 20- 30 minutes daily. As you improve, it will actually be easier for you to do the same amount of exercise  in  30 minutes so always push to the level where you are short of breath.   Make sure you check your oxygen saturations at highest level of activity to be sure it stays over 90% and adjust portable 02 flow  upward to maintain this level if needed but remember to turn it back to previous settings when you stop (to conserve your supply).   Please schedule a follow up visit in 3 months but call sooner if needed

## 2019-04-20 NOTE — Assessment & Plan Note (Signed)
Onset ? 11/2018 with no airflow obst on pfts 11/19/17  - 01/13/2019   > try dulera 100 2bid  - 01/26/2019     continue dulera 100 or symb 80 2bid - 03/09/2019  After extensive coaching inhaler device,  effectiveness =    75% (delay inspiration/ short ti ) > continue symb 80 2bid   Asthma component resolved on symb 80 2bid though still doe due to obesity/ conditioning.   No change in rx  - The proper method of use, as well as anticipated side effects, of a metered-dose inhaler are discussed and demonstrated to the patient. Used golfer analogy to suggest he take practice breaths using empty cannister prior to the real med

## 2019-04-20 NOTE — Assessment & Plan Note (Signed)
HCO3  06/20/17  = 36  HCO3  01/12/2019  = 26  - 03/09/2019   Walked RA  2 laps @  approx 282ft each @ moderate pace  stopped due to  End of study, mild sob with sats still  90%    As of 03/09/2019 rec 02 3lpm hs with cpap only   Advised: Make sure you check your oxygen saturations at highest level of activity to be sure it stays over 90% and adjust upward to maintain this level if needed but remember to turn it back to previous settings when you stop (to conserve your supply).

## 2019-07-21 ENCOUNTER — Ambulatory Visit: Payer: Medicaid Other | Admitting: Internal Medicine

## 2019-07-27 ENCOUNTER — Other Ambulatory Visit: Payer: Self-pay

## 2019-07-27 ENCOUNTER — Ambulatory Visit: Payer: Medicaid Other | Admitting: Internal Medicine

## 2019-07-27 ENCOUNTER — Encounter: Payer: Self-pay | Admitting: Internal Medicine

## 2019-07-27 DIAGNOSIS — J9611 Chronic respiratory failure with hypoxia: Secondary | ICD-10-CM | POA: Diagnosis not present

## 2019-07-27 DIAGNOSIS — J9612 Chronic respiratory failure with hypercapnia: Secondary | ICD-10-CM

## 2019-07-27 DIAGNOSIS — K219 Gastro-esophageal reflux disease without esophagitis: Secondary | ICD-10-CM

## 2019-07-27 DIAGNOSIS — J45991 Cough variant asthma: Secondary | ICD-10-CM

## 2019-07-27 NOTE — Assessment & Plan Note (Addendum)
Body mass index is 48.05 kg/m.  -  trending up Lab Results  Component Value Date   TSH 1.41 09/03/2017     Contributing to gerd risk/ doe/reviewed the need and the process to achieve and maintain neg calorie balance including monitoring 02 sats with ex to assure adequate 02 delivery at peak workload. > defer f/u primary care including intermittently monitoring thyroid status

## 2019-07-27 NOTE — Assessment & Plan Note (Signed)
Onset ? 11/2018 with no airflow obst on pfts 11/19/17  - 01/13/2019   > try dulera 100 2bid  - 01/26/2019     continue dulera 100 or symb 80 2bid - 03/09/2019  After extensive coaching inhaler device,  effectiveness =    75% (delay inspiration/ short ti ) > continue symb 80 2bid   Except for one noct episode that may have been gerd related, All goals of chronic asthma control met including optimal function and elimination of symptoms with minimal need for rescue therapy.  Contingencies discussed in full including contacting this office immediately if not controlling the symptoms using the rule of two's.

## 2019-07-27 NOTE — Assessment & Plan Note (Signed)
HCO3  06/20/17  = 36  HCO3  01/12/2019  = 26  - 03/09/2019   Walked RA  2 laps @  approx 223ft each @ moderate pace  stopped due to  End of study, mild sob with sats still  90%    As of 07/27/2019 rec 02 3lpm hs with cpap only   Advised: Make sure you check your oxygen saturations at highest level of activity to be sure it stays over 90% and adjust upward to maintain this level if needed but remember to turn it back to previous settings when you stop (to conserve your supply).

## 2019-07-27 NOTE — Patient Instructions (Addendum)
Make sure you check your oxygen saturations at highest level of activity to be sure it stays over 90% and adjust upward to maintain this level if needed but remember to turn it back to previous settings when you stop (to conserve your supply).   Pepcid (famotidine) 20 mg after supper or bedtime whichever is easiest   Bed blocks would be very helpful top reduce night time reflux    Please schedule a follow up visit in 6  months but call sooner if needed

## 2019-07-27 NOTE — Progress Notes (Signed)
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 Pulmonary Consult    Referred by Dr. Ginette [name]. Pt states he has always has had SOB and bronchitis since he was a child, but has had increased SOB for the past 3-4 yrs. He states sometimes he gets winded just walking from room to room at home. He is using proair 2 x daily on and albuterol neb 1 x daily on average.   Gradually worse breathing = lifelong and better on 02 / nebs but freq just sob across the room  Doe = MMRC3 = can't walk 100 yards even at a slow pace at a flat grade s stopping due to sob  /02 2lpm  But not while on cpap as the machine is old and no adaptor for it "they said they would call but never did" rec Please see patient coordinator before you leave today  to schedule CPAP titration study> bipap per Craige Cotta Stop lisinopril and start losartan 50-12.5 and  Your breathing should gradually improve Only use your albuterol (PROAIR) as a rescue med Only use Albuterol neb if you first try the proair    12/03/2017  f/u ov/Kurt [name] re: doe with pfts c/w MO only  Chief Complaint  Patient presents with  . Follow-up    Breathing is overall doing well. He has occ cough in the mornings with light yellow sputum. He uses his albuterol inhaler once per wk on average and has not had to use neb.   Dyspnea:  Improved to [facility] = can't walk a nl pace on a flat grade s sob but does fine slow  and flat better on port 02 than off  Cough: min am mucus Sleep: fine on bipap and 02 SABA use:  As above, never noct rec Wt loss    01/13/2019 acute extended ov/Kurt [name] re: worse doe  x 2 month/ indolent onset/ gradaully progressive  Chief Complaint  Patient presents with  . Acute Visit    Increased SOB over the past 2 months. He states he gets SOB walking from parking lot to building. He occ has cough with tan sputum.   no prolem sitting /sleeping on cpap and 02  Main issue is doe worse in heat but also at [employer] can't do but 2 aisle then has to stop MMRC3 = can't walk 100 yards even at a slow pace at a flat grade s stopping due to sob   More cough since doe but min prod tan mucus, daytime only  Better p neb last used neb two days prior to OV   No more leg swelling than usual  Does fine on cpap and 02  Had "full labs" one day prior to ov at pcp office hasn't heard any results yet rec Pantoprazole (protonix) 40 mg   Take  30-60 min before first meal  of the day and Pepcid (famotidine)  20 mg one after supper    Plan A = Automatic = Dulera 100 Take 2 puffs first thing in am and then another 2 puffs about 12 hours later.  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 Follow-up    Breathing better since the last visit.   much less need for albuterol  Dyspnea:  MMRC3 = can't walk 100 yards even at a  slow pace at a flat grade s stopping due to sob/ no chest tightness  Cough: brownish mucus esp in am  Sleeping: fine on cpap and 02 3lpm  SABA use: none now  02: only at hs  rec zpak  Continue dulera 100 (symbicort 80) Take 2 puffs first thing in am and then another 2 puffs about 12 hours later.      03/09/2019  f/u ov/Kurt [name] re: sob related to asthma and obesity/ did not bring all meds   Chief Complaint  Patient presents with  . Follow-up    Breathing has not improved. He is using his albuterol inhaler 4 x per day and neb about 4 x per wk. He had cough this am with blood specks in it.   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  Work on inhaler technique:    Plan B = Backup Only use your albuterol inhaler  Plan C = Crisis - only use your albuterol nebulizer if you first try Plan B Walk up 30 min but make sure your have 02 over 90% - pace yourself    04/20/2019  f/u ov/Kurt [name] re: asthma/ obesity   Chief Complaint  Patient presents with  . Follow-up    Breathing is about the same. He is using his albuterol inhaler 5 x per wk on average and he has not used his neb since the last visit.   Dyspnea:  MMRC2 = can't walk a nl pace on a flat grade s sob but does fine slow and flat  Cough: none  Sleeping: sleeps on back/ on cpap, two pillows SABA use: up to once a day, just the inhaler, not the neb 02: 3lpm hs on cpap  rec Work on inhaler  technique:   To get the most out of exercise, you need to be continuously aware that you  are short of breath, but never out of breath, for 20- 30 minutes daily.  Make sure you check your oxygen saturations at highest level of activity to be sure it stays over 90% and adjust portable 02 flow  upward to maintain this level if needed but remember to turn it back to previous settings when you stop (to conserve your supply).    07/27/2019  f/u ov/Jacoya Bauman re: asthma/ mo/ osa on noct cpap/02  Chief Complaint  Patient presents with  . Follow-up    Had asthma attack approx 1 wk ago- woke him up and had to take off his CPAP and had to use his albuterol inhaler and neb later that day. He has noticed some wheezing recently.   Dyspnea:  One spell p missing pm dose of pepcid of noct wheeze Cough: none  Sleeping: on cpap / 2 pillow / flat bed  SABA use: rarely  02: 3lpm cpap sood and prn daytime    No obvious day to day or daytime variability or assoc excess/ purulent sputum or mucus plugs or hemoptysis or cp or chest tightness,   or overt sinus or hb symptoms.     Also denies any obvious fluctuation of symptoms with weather or environmental changes or other aggravating or alleviating factors except as outlined above   No unusual exposure hx or h/o childhood pna/ asthma or knowledge of premature birth.  Current Allergies, Complete Past Medical History, Past Surgical History, Family History, and Social History were reviewed in [employer] record.  ROS  The following are not active complaints unless bolded Hoarseness, sore throat, dysphagia, dental problems, itching, sneezing,  nasal congestion or discharge of excess mucus or purulent secretions, ear ache,   fever, chills, sweats, unintended wt loss or wt gain, classically pleuritic or exertional cp,  orthopnea pnd or arm/hand swelling  or leg swelling, presyncope, palpitations, abdominal pain, anorexia, nausea, vomiting, diarrhea  or change in bowel habits or change in bladder habits, change in stools or change in urine,  dysuria, hematuria,  rash, arthralgias, visual complaints, headache, numbness, weakness or ataxia or problems with walking or coordination,  change in mood or  memory.        Current Meds  Medication Sig  . albuterol (PROAIR HFA) 108 (90 Base) MCG/ACT inhaler Inhale 2 puffs into the lungs 4 (four) times daily as needed for wheezing or shortness of breath.  Marland Kitchen albuterol (PROVENTIL) (2.5 MG/3ML) 0.083% nebulizer solution Take 2.5 mg by nebulization every 6 (six) hours as needed for wheezing or shortness of breath.  Marland Kitchen aspirin EC 325 MG tablet Take 325 mg by mouth daily.  . budesonide-formoterol (SYMBICORT) 80-4.5 MCG/ACT inhaler Inhale 2 puffs into the lungs 2 (two) times daily.  . cetirizine (ZYRTEC) 10 MG tablet Take 10 mg by mouth as needed.  . Chlorphen-PE-Acetaminophen (NOREL AD) 4-10-325 MG TABS Take by mouth 3 times/day as needed-between meals & bedtime.  . famotidine (PEPCID) 20 MG tablet One after supper  . furosemide (LASIX) 40 MG tablet Take 40 mg by mouth daily.  Marland Kitchen ibuprofen (ADVIL,MOTRIN) 800 MG tablet TK 1 T PO BID PRN  . losartan-hydrochlorothiazide (HYZAAR) 50-12.5 MG tablet TAKE 1 TABLET BY MOUTH EVERY DAY  . MITIGARE 0.6 MG CAPS Take 1 capsule by mouth 2 (two) times daily as needed.  . pantoprazole (PROTONIX) 40 MG tablet Take 40 mg by mouth daily  before breakfast.  . potassium chloride (K-DUR) 10 MEQ tablet Take 10 mEq by mouth daily.  Marland Kitchen tiZANidine (ZANAFLEX) 4 MG tablet Take 4 mg by mouth every 6 (six) hours as needed for muscle spasms.  Marland Kitchen UNABLE TO FIND Med Name: CPAP with 3lpm o2  Adapt  . Vitamin D, Ergocalciferol, (DRISDOL) 50000 units CAPS capsule Take 50,000 Units by mouth once a week.                 Objective:   Physical Exam   07/27/2019         316  04/20/2019         314  03/09/2019             312  01/26/2019         309  01/13/2019           307   12/03/2017        398   09/03/17 (!) 306 lb (138.8 kg)  08/03/17 (!) 304 lb (137.9 kg)  06/22/17 (!) 305  lb 11.2 oz (138.7 kg)      Massively obese bm nad  Vital signs reviewed  07/27/2019  - Note at rest 02 sats  95% on RA      HEENT : pt wearing mask not removed for exam due to covid -19 concerns.    NECK :  without JVD/Nodes/TM/ nl carotid upstrokes bilaterally   LUNGS: no acc muscle use,  Nl contour chest which is clear to A and P bilaterally without cough on insp or exp maneuvers   CV:  RRR  no s3 or murmur or increase in P2, and  Trace pitting edema both ankles    ABD:  Massively obese/ soft and nontender with nl inspiratory excursion in the supine position. No bruits or organomegaly appreciated, bowel sounds nl  MS:  Nl gait/ ext warm without deformities, calf tenderness, cyanosis or clubbing No obvious joint restrictions   SKIN: warm and dry without lesions    NEURO:  alert, approp, nl sensorium with  no motor or cerebellar deficits apparent.    Assessment:

## 2019-07-27 NOTE — Assessment & Plan Note (Signed)
07/27/2019 rec bed blocks,  Hs h2 blocker   Strongly suspect noct event was related to gerd. 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          Each maintenance medication was reviewed in detail including emphasizing most importantly the difference between maintenance and prns and under what circumstances the prns are to be triggered using an action plan format that is not reflected in the computer generated alphabetically organized AVS which I have not found useful in most complex patients, especially with respiratory illnesses  Total time for H and P, chart review, counseling, teaching device and generating AVS / charting = 20 min

## 2019-09-28 ENCOUNTER — Telehealth (HOSPITAL_COMMUNITY): Payer: Self-pay

## 2019-09-28 NOTE — Telephone Encounter (Signed)
We received a referral from [facility] for Arterial Doppler on 09/14/19. I called patient on 09/17/19, 09/20/19, and 09/22/19. I spoke w/Tee at the office and was told to disregard if patient wasn't calling back. Order was shredded.  [employer]

## 2019-11-08 IMAGING — DX DG CHEST 2V
2 series · 2 of 2 positions shown · non-contrast
Comparison: None.

CLINICAL DATA: Shortness of Breath

EXAM:
CHEST  2 VIEW

[chest lat]
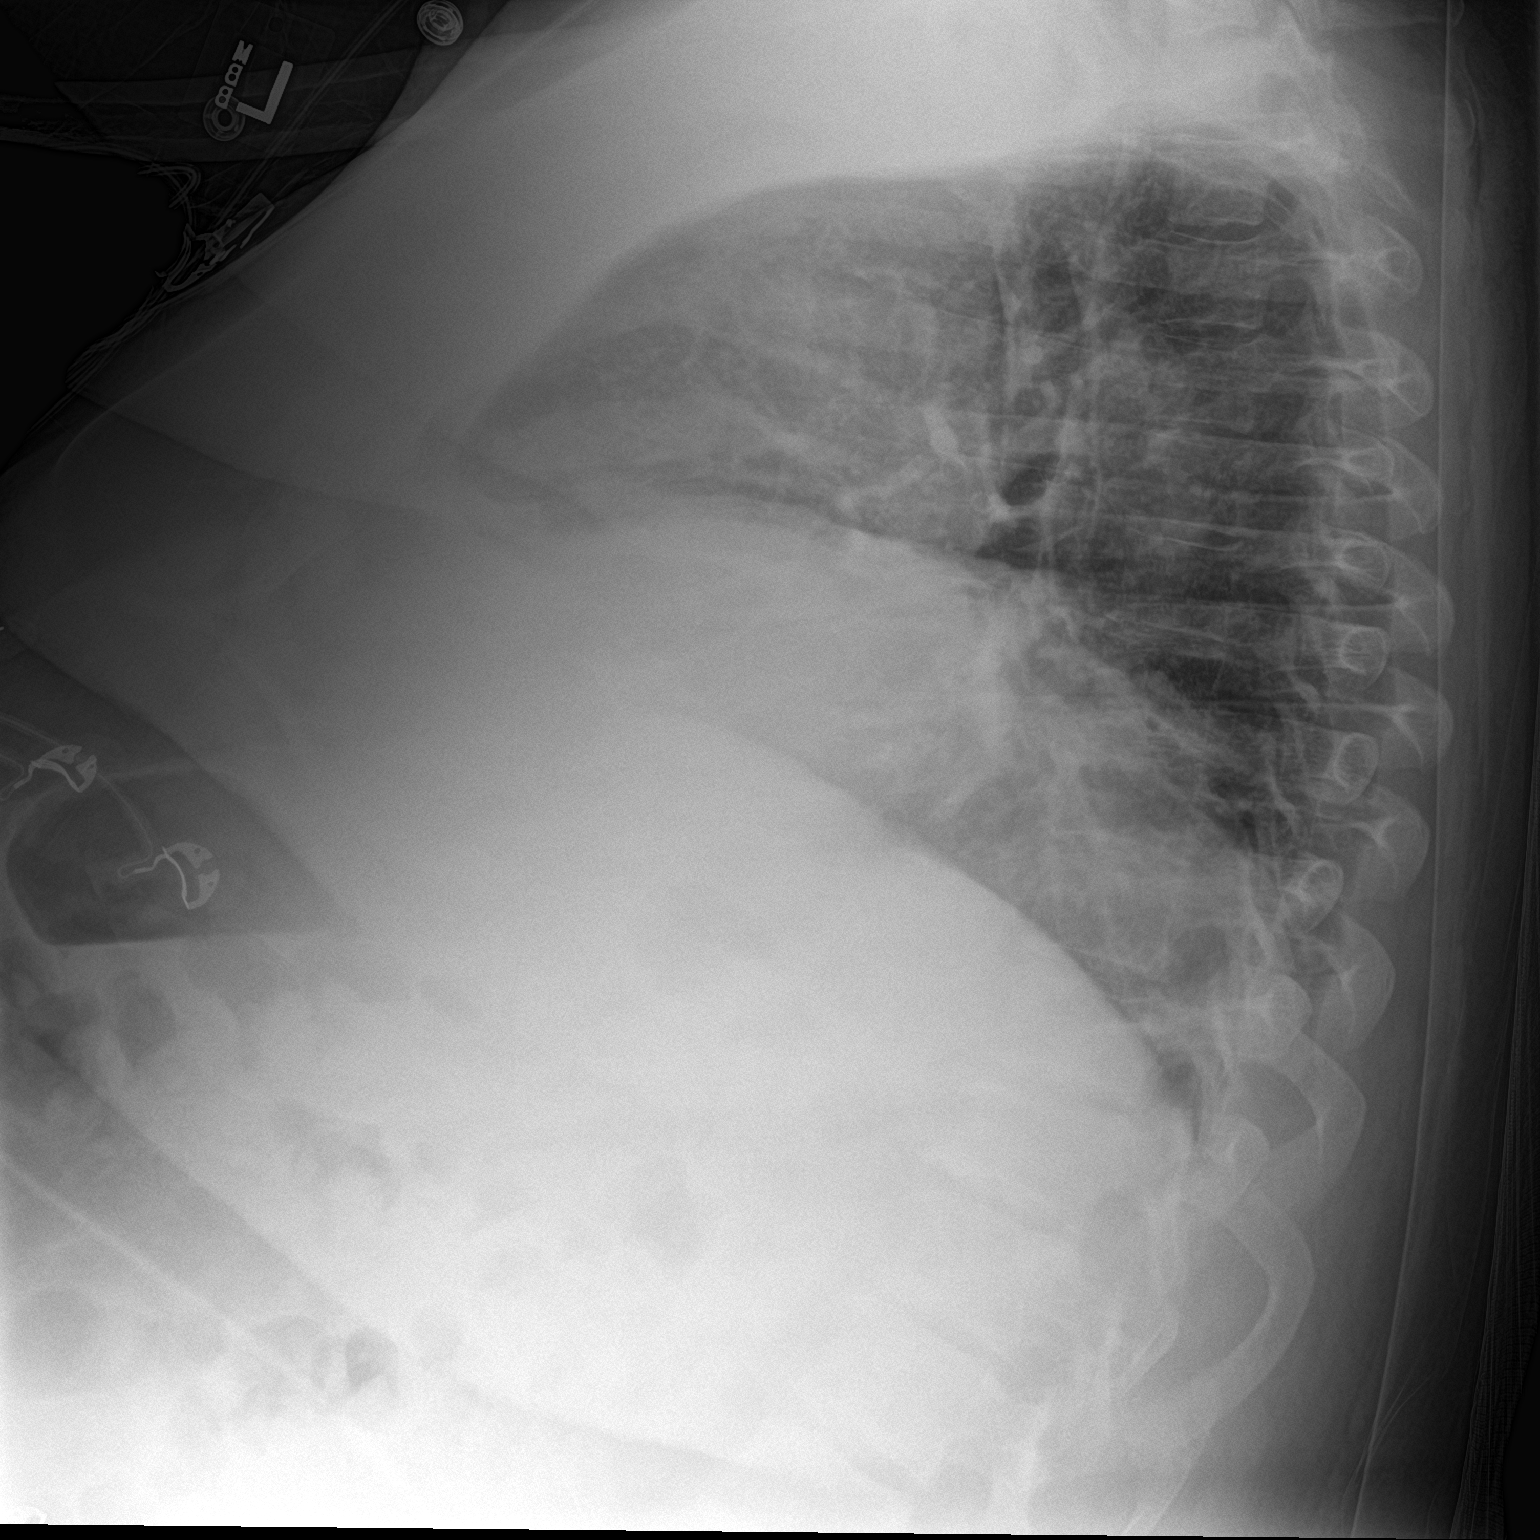

[chest ap]
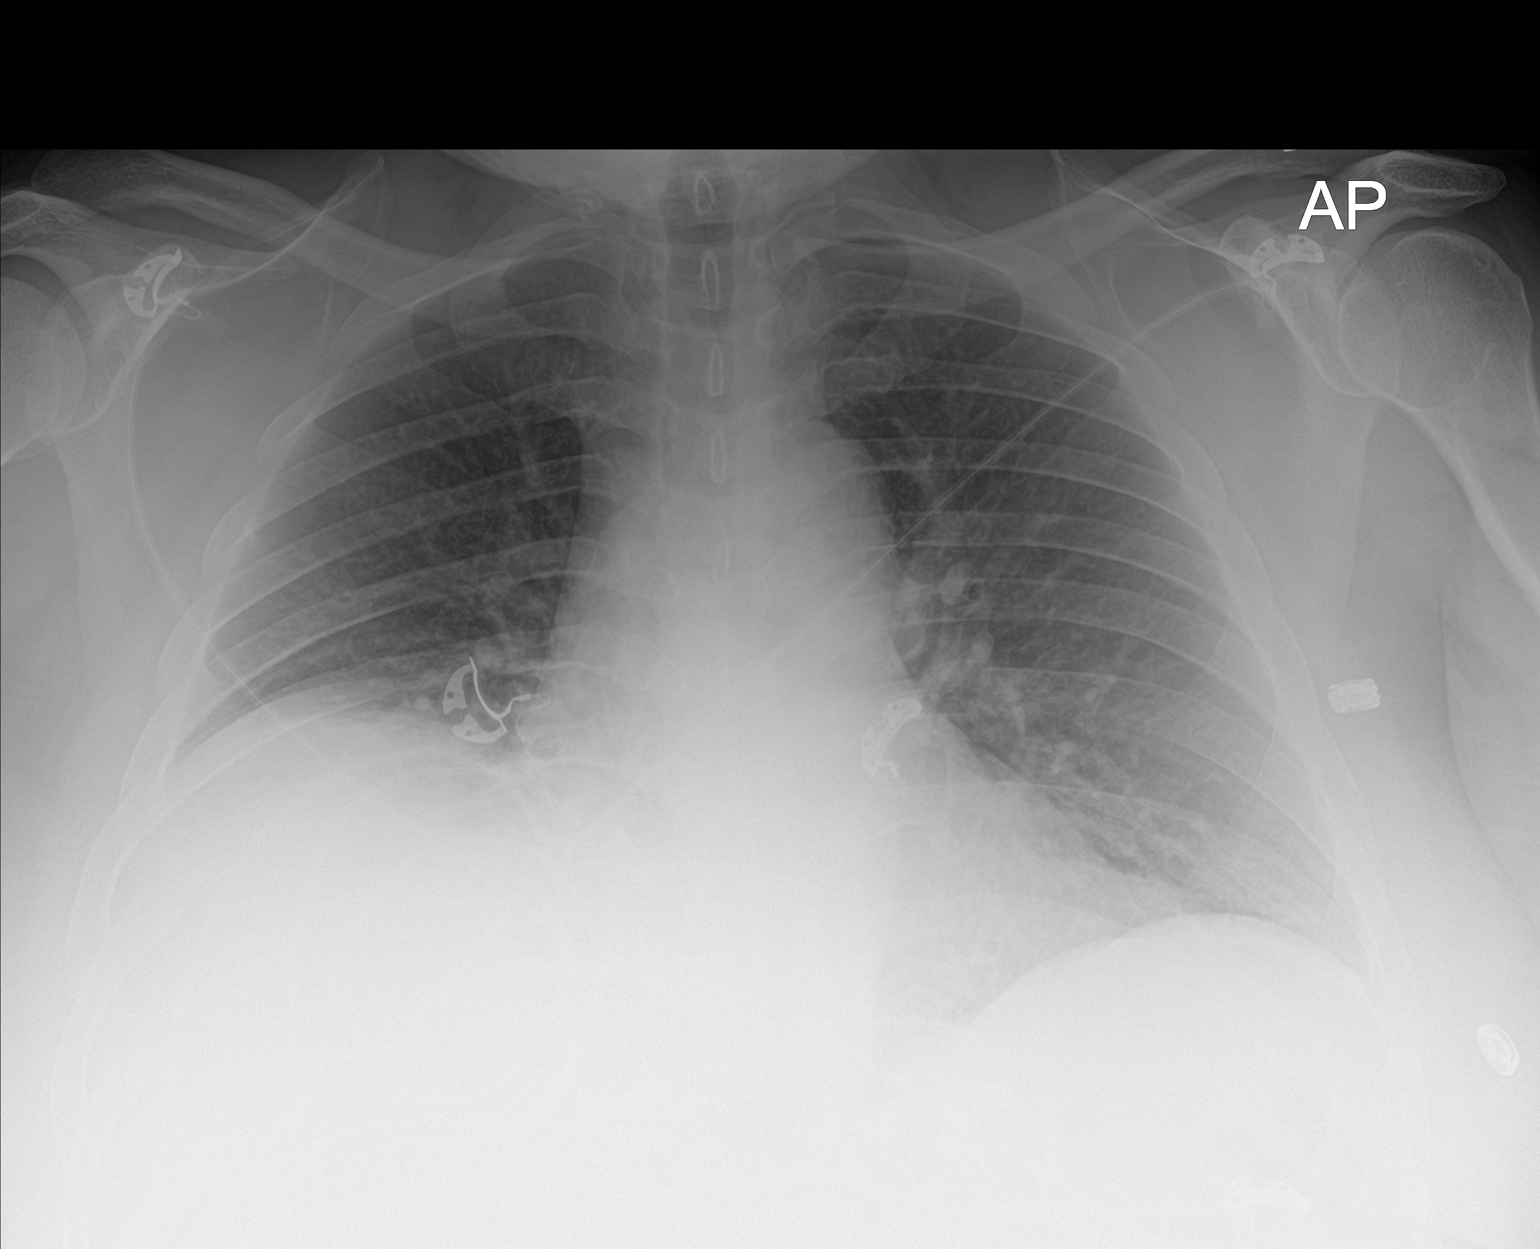

[2 of 2 positions shown; findings below may reference images not displayed]

FINDINGS: Elevation of the right hemidiaphragm with right base opacity, likely
atelectasis. No confluent opacity on the left. Heart is borderline
enlarged. No effusions or acute bony abnormality.
IMPRESSION: Elevated right hemidiaphragm with right base atelectasis.

## 2019-12-27 ENCOUNTER — Telehealth: Payer: Self-pay | Admitting: Internal Medicine

## 2019-12-27 MED ORDER — ALBUTEROL SULFATE HFA 108 (90 BASE) MCG/ACT IN AERS: 2.0000 | INHALATION_SPRAY | Freq: Four times a day (QID) | RESPIRATORY_TRACT | 5 refills | Status: DC | PRN

## 2019-12-27 NOTE — Telephone Encounter (Signed)
Refill for albuterol inhaler has been sent to preferred pharmacy for pt. Called and spoke with pt letting him know this had been done and he verbalized understanding. Nothing further needed.

## 2020-01-24 ENCOUNTER — Ambulatory Visit: Payer: Medicaid Other | Admitting: Internal Medicine

## 2020-02-25 ENCOUNTER — Ambulatory Visit: Payer: Medicaid Other | Admitting: Internal Medicine

## 2020-04-17 ENCOUNTER — Ambulatory Visit: Payer: Medicaid Other | Admitting: Internal Medicine

## 2020-05-01 ENCOUNTER — Ambulatory Visit: Payer: Medicaid Other | Admitting: Internal Medicine

## 2020-05-01 ENCOUNTER — Encounter: Payer: Self-pay | Admitting: Internal Medicine

## 2020-05-01 ENCOUNTER — Other Ambulatory Visit: Payer: Self-pay

## 2020-05-01 DIAGNOSIS — J9612 Chronic respiratory failure with hypercapnia: Secondary | ICD-10-CM | POA: Diagnosis not present

## 2020-05-01 DIAGNOSIS — J9611 Chronic respiratory failure with hypoxia: Secondary | ICD-10-CM

## 2020-05-01 DIAGNOSIS — J45991 Cough variant asthma: Secondary | ICD-10-CM

## 2020-05-01 NOTE — Progress Notes (Signed)
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 Pulmonary Consult    Referred by Dr. Ginette [name]. Pt states he has always has had SOB and bronchitis since he was a child, but has had increased SOB for the past 3-4 yrs. He states sometimes he gets winded just walking from room to room at home. He is using proair 2 x daily on and albuterol neb 1 x daily on average.   Gradually worse breathing = lifelong and better on 02 / nebs but freq just sob across the room  Doe = MMRC3 = can't walk 100 yards even at a slow pace at a flat grade s stopping due to sob  /02 2lpm  But not while on cpap as the machine is old and no adaptor for it "they said they would call but never did" rec Please see patient coordinator before you leave today  to schedule CPAP titration study> bipap per Kurt [name] Stop lisinopril and start losartan 50-12.5 and  Your breathing should gradually improve Only use your albuterol (PROAIR) as a rescue med Only use Albuterol neb if you first try the proair    12/03/2017  f/u ov/Kurt [name] re: doe with pfts c/w MO only  Chief Complaint  Patient presents with  . Follow-up    Breathing is overall doing well. He has occ cough in the mornings with light yellow sputum. He uses his albuterol inhaler once per wk on average and has not had to use neb.   Dyspnea:  Improved to [facility] = can't walk a nl pace on a flat grade s sob but does fine slow  and flat better on port 02 than off  Cough: min am mucus Sleep: fine on bipap and 02 SABA use:  As above, never noct rec Wt loss    01/13/2019 acute extended ov/Kurt [name] re: worse doe  x 2 month/ indolent onset/ gradaully progressive  Chief Complaint  Patient presents with  . Acute Visit    Increased SOB over the past 2 months. He states he gets SOB walking from parking lot to building. He occ has cough with tan sputum.   no prolem sitting /sleeping on cpap and 02  Main issue is doe worse in heat but also at [employer] can't do but 2 aisle then has to stop MMRC3 = can't walk 100 yards even at a slow pace at a flat grade s stopping due to sob   More cough since doe but min prod tan mucus, daytime only  Better p neb last used neb two days prior to OV   No more leg swelling than usual  Does fine on cpap and 02  Had "full labs" one day prior to ov at pcp office hasn't heard any results yet rec Pantoprazole (protonix) 40 mg   Take  30-60 min before first meal  of the day and Pepcid (famotidine)  20 mg one after supper    Plan A = Automatic = Dulera 100 Take 2 puffs first thing in am and then another 2 puffs about 12 hours later.  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 = Backup Only use your albuterol(proair)  inhaler as a rescue medication Plan C = Crisis - only use your albuterol nebulizer if you first try Plan B and it fails to help > ok to use the nebulizer up to every 4 hours but if start needing it regularly call for immediate appointment Please schedule a follow up office visit in 2  weeks, sooner if needed  with all medications   01/26/2019  f/u ov/Kurt [name] re: doe / able to work as Kurt [name] protonix caused diarrhea x 2 days so stopped/ did not report as problem/ still has 15 extra doses in dulera where should be 4-5  Chief Complaint  Patient presents with  . Follow-up    Breathing better since the last visit.   much less need for albuterol  Dyspnea:  MMRC3 = can't walk 100 yards even at a  slow pace at a flat grade s stopping due to sob/ no chest tightness  Cough: brownish mucus esp in am  Sleeping: fine on cpap and 02 3lpm  SABA use: none now  02: only at hs  rec zpak  Continue dulera 100 (symbicort 80) Take 2 puffs first thing in am and then another 2 puffs about 12 hours later.      03/09/2019  f/u ov/Kurt [name] re: sob related to asthma and obesity/ did not bring all meds   Chief Complaint  Patient presents with  . Follow-up    Breathing has not improved. He is using his albuterol inhaler 4 x per day and neb about 4 x per wk. He had cough this am with blood specks in it.   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  Work on inhaler technique:    Plan B = Backup Only use your albuterol inhaler  Plan C = Crisis - only use your albuterol nebulizer if you first try Plan B Walk up 30 min but make sure your have 02 over 90% - pace yourself    04/20/2019  f/u ov/Kurt [name] re: asthma/ obesity   Chief Complaint  Patient presents with  . Follow-up    Breathing is about the same. He is using his albuterol inhaler 5 x per wk on average and he has not used his neb since the last visit.   Dyspnea:  MMRC2 = can't walk a nl pace on a flat grade s sob but does fine slow and flat  Cough: none  Sleeping: sleeps on back/ on cpap, two pillows SABA use: up to once a day, just the inhaler, not the neb 02: 3lpm hs on cpap  rec Work on inhaler  technique:   To get the most out of exercise, you need to be continuously aware that you  are short of breath, but never out of breath, for 20- 30 minutes daily.  Make sure you check your oxygen saturations at highest level of activity to be sure it stays over 90% and adjust portable 02 flow  upward to maintain this level if needed but remember to turn it back to previous settings when you stop (to conserve your supply).    07/27/2019  f/u ov/Venba Zenner re: asthma/ mo/ osa on noct cpap/02  Chief Complaint  Patient presents with  . Follow-up    Had asthma attack approx 1 wk ago- woke him up and had to take off his CPAP and had to use his albuterol inhaler and neb later that day. He has noticed some wheezing recently.   Dyspnea:  One spell p missing pm dose of pepcid of noct wheeze Cough: none  Sleeping: on cpap / 2 pillow / flat bed  SABA use: rarely  02: 3lpm cpap per sood and prn daytime  rec Make sure you check your oxygen saturations at highest level of activity  Pepcid (famotidine) 20 mg after supper or bedtime whichever is easiest  Bed blocks would be very helpful top reduce night time reflux     05/01/2020  f/u ov/Mindy Behnken re: asthma/ mo  symb 80 2bid not vaccinated  Chief Complaint  Patient presents with  . 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   Sleeping as above without nocturnal  or early am exacerbation  of respiratory  c/o's or need for noct saba. Also denies any obvious fluctuation of symptoms with weather or environmental changes or other aggravating or alleviating factors except as outlined above   No unusual  exposure hx or h/o childhood pna/ asthma or knowledge of premature birth.  Current Allergies, Complete Past Medical History, Past Surgical History, Family History, and Social History were reviewed in [employer] record.  ROS  The following are not active complaints unless bolded Hoarseness, sore throat, dysphagia, dental problems, itching, sneezing,  nasal congestion or discharge of excess mucus or purulent secretions, ear ache,   fever, chills, sweats, unintended wt loss intended  or wt gain, classically pleuritic or exertional cp,  orthopnea pnd or arm/hand swelling  or leg swelling, presyncope, palpitations, abdominal pain, anorexia, nausea, vomiting, diarrhea  or change in bowel habits or change in bladder habits, change in stools or change in urine, dysuria, hematuria,  rash, arthralgias, visual complaints, headache, numbness, weakness or ataxia or problems with walking or coordination,  change in mood or  memory.        Current Meds  Medication Sig  . albuterol (PROAIR HFA) 108 (90 Base) MCG/ACT inhaler Inhale 2 puffs into the lungs 4 (four) times daily as needed for wheezing or shortness of breath.  Marland Kitchen albuterol (PROVENTIL) (2.5 MG/3ML) 0.083% nebulizer solution Take 2.5 mg by nebulization every 6 (six) hours as needed for wheezing  or shortness of breath.  Marland Kitchen aspirin EC 325 MG tablet Take 325 mg by mouth daily.  . budesonide-formoterol (SYMBICORT) 80-4.5 MCG/ACT inhaler Inhale 2 puffs into the lungs 2 (two) times daily.  . cetirizine (ZYRTEC) 10 MG tablet Take 10 mg by mouth as needed.  . Chlorphen-PE-Acetaminophen (NOREL AD) 4-10-325 MG TABS Take by mouth 3 times/day as needed-between meals & bedtime.  . famotidine (PEPCID) 20 MG tablet One after supper  . furosemide (LASIX) 40 MG tablet Take 40 mg by mouth daily.  Marland Kitchen ibuprofen (ADVIL,MOTRIN) 800 MG tablet TK 1 T PO BID PRN  . losartan-hydrochlorothiazide (HYZAAR) 50-12.5 MG tablet TAKE 1 TABLET BY MOUTH EVERY DAY  .  MITIGARE 0.6 MG CAPS Take 1 capsule by mouth 2 (two) times daily as needed.  . pantoprazole (PROTONIX) 40 MG tablet Take 40 mg by mouth daily before breakfast.  . potassium chloride (K-DUR) 10 MEQ tablet Take 10 mEq by mouth daily.  Marland Kitchen tiZANidine (ZANAFLEX) 4 MG tablet Take 4 mg by mouth every 6 (six) hours as needed for muscle spasms.  Marland Kitchen UNABLE TO FIND Med Name: CPAP with 3lpm o2  Adapt  . Vitamin D, Ergocalciferol, (DRISDOL) 50000 units CAPS capsule Take 50,000 Units by mouth once a week.                     Objective:   Physical Exam  05/01/2020        274 07/27/2019         316  04/20/2019         314  03/09/2019             312  01/26/2019         309  01/13/2019           307   12/03/2017        398   09/03/17 (!) 306 lb (138.8 kg)  08/03/17 (!) 304 lb (137.9 kg)  06/22/17 (!) 305 lb 11.2 oz (138.7 kg)       Vital signs reviewed  05/01/2020  - Note at rest 02 sats  94% on RA     amb obese bm nad   HEENT : pt wearing mask not removed for exam due to covid -19 concerns.    NECK :  without JVD/Nodes/TM/ nl carotid upstrokes bilaterally   LUNGS: no acc muscle use,  Nl contour chest which is clear to A and P bilaterally without cough on insp or exp maneuvers   CV:  RRR  no s3 or murmur or increase in P2, and  Trace sym pitting both LE's   ABD: obes  soft and nontender with nl inspiratory excursion in the supine position. No bruits or organomegaly appreciated, bowel sounds nl  MS:  Nl gait/ ext warm without deformities, calf tenderness, cyanosis or clubbing No obvious joint restrictions   SKIN: warm and dry without lesions    NEURO:  alert, approp, nl sensorium with  no motor or cerebellar deficits apparent.       Assessment:

## 2020-05-01 NOTE — Assessment & Plan Note (Signed)
Onset ? 11/2018 with no airflow obst on pfts 11/19/17  - 01/13/2019   > try dulera 100 2bid  - 01/26/2019     continue dulera 100 or symb 80 2bid - 05/01/2020  After extensive coaching inhaler device,  effectiveness =   75% (delayed insp)   Despite suboptimal hfa  All goals of chronic asthma control met including optimal function and elimination of symptoms with minimal need for rescue therapy (he only uses with doe which is most likely not EIA or relatted to airflow obst at all   I spent extra time with pt today reviewing appropriate use of albuterol for prn use on exertion with the following points: 1) saba is for relief of sob that does not improve by walking a slower pace or resting but rather if the pt does not improve after trying this first. 2) If the pt is convinced, as many are, that saba helps recover from activity faster then it's easy to tell if this is the case by re-challenging : ie stop, take the inhaler, then p 5 minutes try the exact same activity (intensity of workload) that just caused the symptoms and see if they are substantially diminished or not after saba 3) if there is an activity that reproducibly causes the symptoms, try the saba 15 min before the activity on alternate days   If in fact the saba really does help, then fine to continue to use it prn but advised may need to look closer at the maintenance regimen being used to achieve better control of airways disease with exertion.   Contingencies discussed in full including contacting this office immediately if not controlling the symptoms using the rule of two's.

## 2020-05-01 NOTE — Assessment & Plan Note (Addendum)
Body mass index is 43.69 kg/m.  -  trending down/ encouraged Lab Results  Component Value Date   TSH 1.41 09/03/2017     Contributing to gerd risk/ doe/reviewed the need and the process to achieve and maintain neg calorie balance > defer f/u primary care including intermittently monitoring thyroid status

## 2020-05-01 NOTE — Assessment & Plan Note (Signed)
HCO3  06/20/17  = 36  HCO3  01/12/2019  = 26  - 03/09/2019   Walked RA  2 laps @  approx 241ft each @ moderate pace  stopped due to  End of study, mild sob with sats still  90%    As of 05/01/2020 rec 02 3lpm hs with cpap  And prn with ex to keep sats > 90%   rec Make sure you check your oxygen saturations at highest level of activity to be sure it stays over 90% and adjust  02 flow upward to maintain this level if needed but remember to turn it back to previous settings when you stop (to conserve your supply).    F/u a 6 m         Each maintenance medication was reviewed in detail including emphasizing most importantly the difference between maintenance and prns and under what circumstances the prns are to be triggered using an action plan format where appropriate.  Total time for H and P, chart review, counseling, teaching device and generating customized AVS unique to this office visit / charting = 20 min

## 2020-05-01 NOTE — Patient Instructions (Addendum)
Work on inhaler technique:  relax and gently blow all the way out then take a nice smooth deep breath back in, triggering the inhaler at same time you start breathing in.  Hold for up to 5 seconds if you can. Blow out symbicort or dulera thru nose. Rinse and gargle with water when done  Only use your albuterol as a rescue medication to be used if you can't catch your breath by resting or doing a relaxed purse lip breathing pattern.  - The less you use it, the better it will work when you need it. - Ok to use up to 2 puffs  every 4 hours if you must but call for immediate appointment if use goes up over your usual need - Don't leave home without it !!  (think of it like the spare tire for your car)   Also ok to Try albuterol 15 min before an activity that you know would make you short of breath and see if it makes any difference and if makes none then don't take it after activity unless you can't catch your breath.  Make sure you check your oxygen saturations at highest level of activity to be sure it stays over 90% and adjust  02 flow upward to maintain this level if needed but remember to turn it back to previous settings when you stop (to conserve your supply).   Pt informed of the seriousness of COVID 19 infection as a direct risk to lung health  and safey and to close contacts and should continue to wear a facemask in public and minimize exposure to public locations but especially avoid any area or activity where non-close contacts are not observing distancing or wearing an appropriate face mask.  I strongly recommended she take either of the vaccines available through local drugstores based on updated information on millions of Americans treated with the Moderna and [employer] products  which have proven both safe and  effective even against the new delta variant.     Please schedule a follow up visit in 6  months but call sooner if needed with inhalers and your portable 02

## 2020-10-30 ENCOUNTER — Ambulatory Visit: Payer: Medicaid Other | Admitting: Internal Medicine

## 2021-03-22 ENCOUNTER — Ambulatory Visit: Payer: Medicaid Other | Admitting: Internal Medicine

## 2021-03-22 NOTE — Progress Notes (Deleted)
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 Ginette [name]. Pt states he has always has had SOB and bronchitis since he was a child, but has had increased SOB for the past 3-4 yrs. He states sometimes he gets winded just walking from room to room at home. He is using proair 2 x daily on and albuterol neb 1 x daily on average.   Gradually worse breathing = lifelong and better on 02 / nebs but freq just sob across the room  Doe = MMRC3 = can't walk 100 yards even at a slow pace at a flat grade s stopping due to sob  /02 2lpm  But not while on cpap as the machine is old and no adaptor for it "they said they would call but never did" rec Please see patient coordinator before you leave today  to schedule CPAP titration study> bipap per Kurt [name] Stop lisinopril and start losartan 50-12.5 and  Your breathing should gradually improve Only use your albuterol (PROAIR) as a rescue med Only use Albuterol neb if you first try the proair    01/13/2019 acute extended ov/Kurt [name] re: worse doe  x 2 month/ indolent onset/ gradaully progressive  Chief Complaint  Patient presents with   Acute Visit    Increased SOB over the past 2 months. He states he gets SOB walking from parking lot to building. He occ has cough with tan sputum.   no prolem sitting /sleeping on cpap and 02  Main issue is doe worse in heat but also at  [employer] can't do but 2 aisle then has to stop MMRC3 = can't walk 100 yards even at a slow pace at a flat grade s stopping due to sob   More cough since doe but min prod tan mucus, daytime only  Better p neb last used neb two days prior to OV   No more leg swelling than usual  Does fine on cpap and 02  Had "full labs" one day prior to ov at pcp office hasn't heard any results yet rec Pantoprazole (protonix) 40 mg   Take  30-60 min before first meal of the day and Pepcid (famotidine)  20 mg one after supper    Plan A = Automatic = Dulera 100 Take 2 puffs first thing in am and then another 2 puffs about 12 hours later.  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 = Backup Only use your albuterol(proair)  inhaler as a rescue medication Plan C = Crisis - only use your albuterol nebulizer if you first try Plan B and it fails to help > ok to use the nebulizer up to every 4 hours but if start needing it regularly call for immediate appointment Please schedule a follow up office visit in 2  weeks,  sooner if needed  with all medications   01/26/2019  f/u ov/Kurt [name] re: doe / able to work as Conservation officer, historic buildings protonix caused diarrhea x 2 days so stopped/ did not report as problem/ still has 15 extra doses in dulera where should be 4-5  Chief Complaint  Patient presents with   Follow-up    Breathing better since the last visit.   much less need for albuterol  Dyspnea:  MMRC3 = can't walk 100 yards even at a slow pace at a flat grade s stopping due to sob/ no chest tightness  Cough: brownish mucus esp in am  Sleeping: fine on cpap and 02 3lpm  SABA use: none now  02: only at hs  rec zpak  Continue dulera 100 (symbicort 80) Take 2 puffs first thing in am and then another 2 puffs about 12 hours later.      03/09/2019  f/u ov/Kurt [name] re: sob related to asthma and obesity/ did not bring all meds   Chief Complaint  Patient presents with   Follow-up    Breathing has not improved. He is using his albuterol inhaler 4 x  per day and neb about 4 x per wk. He had cough this am with blood specks in it.   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  Work on inhaler technique:    Plan B = Backup Only use your albuterol inhaler  Plan C = Crisis - only use your albuterol nebulizer if you first try Plan B Walk up 30 min but make sure your have 02 over 90% - pace yourself    04/20/2019  f/u ov/Kurt [name] re: asthma/ obesity   Chief Complaint  Patient presents with   Follow-up    Breathing is about the same. He is using his albuterol inhaler 5 x per wk on average and he has not used his neb since the last visit.   Dyspnea:  MMRC2 = can't walk a nl pace on a flat grade s sob but does fine slow and flat  Cough: none  Sleeping: sleeps on back/ on cpap, two pillows SABA use: up to once a day, just the inhaler, not the neb 02: 3lpm hs on cpap  rec Work on inhaler technique:   To get the most out of exercise, you need to be continuously aware that you are short of breath, but never out of breath, for 20- 30 minutes daily.  Make sure you check your oxygen saturations at highest level of activity to be sure it stays over 90% and adjust portable 02 flow  upward to maintain this level if needed but remember to turn it back to previous settings when you stop (to conserve your supply).    07/27/2019  f/u ov/Kurt [name] re: asthma/ mo/ osa on noct cpap/02  Chief Complaint  Patient presents with   Follow-up    Had asthma attack approx  1 wk ago- woke him up and had to take off  his CPAP and had to use his albuterol inhaler and neb later that day. He has noticed some wheezing recently.   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   Dyspnea:  *** Cough: *** Sleeping: *** SABA use: *** 02: *** Covid status:   ***   No obvious day to day or daytime variability or assoc excess/ purulent sputum or mucus plugs or hemoptysis or cp or chest tightness, subjective wheeze or overt sinus or hb symptoms.   *** without nocturnal  or early am exacerbation  of respiratory  c/o's or need for noct saba. Also denies any obvious fluctuation of symptoms with weather or environmental changes or other aggravating or alleviating factors except as outlined above   No unusual exposure hx or h/o childhood pna/ asthma or knowledge of premature  birth.  Current Allergies, Complete Past Medical History, Past Surgical History, Family History, and Social History were reviewed in [employer] record.  ROS  The following are not active complaints unless bolded Hoarseness, sore throat, dysphagia, dental problems, itching, sneezing,  nasal congestion or discharge of excess mucus or purulent secretions, ear ache,   fever, chills, sweats, unintended wt loss or wt gain, classically pleuritic or exertional cp,  orthopnea pnd or arm/hand swelling  or leg swelling, presyncope, palpitations, abdominal pain, anorexia, nausea, vomiting, diarrhea  or change in bowel habits or change in bladder habits, change in stools or change in urine, dysuria, hematuria,  rash, arthralgias, visual complaints, headache, numbness, weakness or ataxia or problems with walking or coordination,  change in mood or  memory.        No outpatient medications have been marked as taking for the 03/22/21 encounter (Appointment) with Nyoka Cowden, MD.                       Objective:   Physical Exam  03/22/2021         ***  05/01/2020       274 07/27/2019         316  04/20/2019         314  03/09/2019             312  01/26/2019         309  01/13/2019           307   12/03/2017        398   09/03/17 (!) 306 lb (138.8 kg)  08/03/17 (!) 304 lb (137.9 kg)  06/22/17 (!) 305 lb 11.2 oz (138.7 kg)       Vital signs reviewed  03/22/2021  - Note at rest 02 sats  ***% on ***   General appearance:    ***          Assessment:

## 2021-03-30 ENCOUNTER — Encounter: Payer: Self-pay | Admitting: Pulmonary Disease

## 2021-03-30 ENCOUNTER — Ambulatory Visit (INDEPENDENT_AMBULATORY_CARE_PROVIDER_SITE_OTHER): Payer: Medicaid Other | Admitting: Pulmonary Disease

## 2021-03-30 ENCOUNTER — Other Ambulatory Visit: Payer: Self-pay

## 2021-03-30 VITALS — BP 114/72 | HR 80 | Temp 98.0°F | Ht 66.5 in | Wt 262.0 lb

## 2021-03-30 DIAGNOSIS — E662 Morbid (severe) obesity with alveolar hypoventilation: Secondary | ICD-10-CM | POA: Diagnosis not present

## 2021-03-30 DIAGNOSIS — G4733 Obstructive sleep apnea (adult) (pediatric): Secondary | ICD-10-CM | POA: Diagnosis not present

## 2021-03-30 DIAGNOSIS — J411 Mucopurulent chronic bronchitis: Secondary | ICD-10-CM

## 2021-03-30 DIAGNOSIS — J9612 Chronic respiratory failure with hypercapnia: Secondary | ICD-10-CM

## 2021-03-30 DIAGNOSIS — J9611 Chronic respiratory failure with hypoxia: Secondary | ICD-10-CM | POA: Diagnosis not present

## 2021-03-30 MED ORDER — ALBUTEROL SULFATE (2.5 MG/3ML) 0.083% IN NEBU: 2.5000 mg | INHALATION_SOLUTION | Freq: Four times a day (QID) | RESPIRATORY_TRACT | 3 refills | Status: AC | PRN

## 2021-03-30 MED ORDER — MONTELUKAST SODIUM 10 MG PO TABS: 10.0000 mg | ORAL_TABLET | Freq: Every day | ORAL | 5 refills | Status: DC

## 2021-03-30 MED ORDER — ALBUTEROL SULFATE HFA 108 (90 BASE) MCG/ACT IN AERS: 2.0000 | INHALATION_SPRAY | Freq: Four times a day (QID) | RESPIRATORY_TRACT | 3 refills | Status: DC | PRN

## 2021-03-30 MED ORDER — BREZTRI AEROSPHERE 160-9-4.8 MCG/ACT IN AERO: 2.0000 | INHALATION_SPRAY | Freq: Two times a day (BID) | RESPIRATORY_TRACT | 5 refills | Status: DC

## 2021-03-30 MED ORDER — BREZTRI AEROSPHERE 160-9-4.8 MCG/ACT IN AERO: 2.0000 | INHALATION_SPRAY | Freq: Two times a day (BID) | RESPIRATORY_TRACT | Status: DC

## 2021-03-30 MED ORDER — BREZTRI AEROSPHERE 160-9-4.8 MCG/ACT IN AERO: 2.0000 | INHALATION_SPRAY | Freq: Two times a day (BID) | RESPIRATORY_TRACT | 0 refills | Status: DC

## 2021-03-30 NOTE — Patient Instructions (Signed)
Try using breztri two puffs in the morning and two puffs in the evening, and rinse your mouth after each use   Don't use symbicort when using breztri  Singulair 10 mg nightly  Follow up with Dr. Sherene Sires in October 2022  Follow up with Dr. Craige Cotta in 6 months

## 2021-03-30 NOTE — Progress Notes (Signed)
[facility]  Chief Complaint  Patient presents with   Follow-up    OSA follow up.     Constitutional:  BP 114/72 (BP Location: Left Arm, Patient Position: Sitting, Cuff Size: Large)   Pulse 80   Temp 98 F (36.7 C) (Oral)   Ht 5' 6.5" (1.689 m)   Wt 262 lb (118.8 kg)   SpO2 95%   BMI 41.65 kg/m   Past Medical History:  HTN, Gout, GERD, Depression, OA, Anxiety  Past Surgical History:  He  has a past surgical history that includes No past surgeries.  Brief Summary:  Kurt [name] is a 60 y.o. male with obstructive sleep apnea, obesity hypoventilation, and chronic respiratory failure.      Subjective:   He has lost about 40 lbs since I saw him last in 2020.  He still has cough and wheeze.  Feels like he has this all the time.  Uses Bipap.  Pressure okay.  No issue with mask fit.  Uses 3 liters oxygen with exertion and with Bipap at night.  Download shows AHI 3.7 with Bipap 12/8 cm H2O  Physical Exam:   Appearance - well kempt, wearing  ENMT - no sinus tenderness, no oral exudate, no LAN, Mallampati 3 airway, no stridor  Respiratory - faint b/l wheeze  CV - s1s2 regular rate and rhythm, no murmurs  Ext - no clubbing, no edema  Skin - no rashes  Psych - normal mood and affect   Pulmonary testing:  PFT 11/19/17 >> FEV1 1.96 (70%), FEV1% 82, TLC 4.32 (69%), DLCO 96%  Sleep Tests:  PSG 08/03/17 >> AHI 108.1 Bipap 09/22/17 >> Bipap 19/15 cm H2O with 3 liters O2 Bipap 01/10/19 to 02/08/19 >> used on 29 of 30 nights with average 5 hrs 46 min.  Average AHI 5.3 with Bipap 12/8 cm H2O  Social History:  He  reports that he quit smoking about 14 years ago. His smoking use included cigarettes. He has a 30.00 pack-year smoking history. He has never used smokeless tobacco. He reports that he does not drink alcohol and does not use drugs.  Family History:  His family history includes Hypertension in his mother.      Assessment/Plan:   Obstructive sleep apnea. - he is compliant with Bipap and reports benefit from therapy - he uses [facility] for his DME - continue Bipap 12/8 amd H2O   Obesity hypoventilation syndrome. - he uses 3 liters with exertion and sleep - advised that he might be able to transition off supplemental oxygen if he continues to lose weight   Chronic bronchitis. - has progressive symptoms - will have him try breztri in place of symbicort - will add singulair - refilled albuterol HFA and nebulizer - he will follow up with Dr. Sherene Sires in October 2022 to further assess - he will get flu shot at his pharmacy later this month  Time Spent Involved in Patient Care on Day of Examination:  32 minutes  Follow up:   Patient Instructions  Try using breztri two puffs in the morning and two puffs in the evening, and rinse your mouth after each use   Don't use symbicort when using breztri  Singulair 10 mg nightly  Follow up with Dr. Sherene Sires in October 2022  Follow up with Dr. Craige Cotta in 6 months  Medication List:   Allergies as of 03/30/2021       Reactions   Penicillins Itching   Has patient  had a PCN reaction causing immediate rash, facial/tongue/throat swelling, SOB or lightheadedness with hypotension: No Has patient had a PCN reaction causing severe rash involving mucus membranes or skin necrosis: No Has patient had a PCN reaction that required hospitalization: No Has patient had a PCN reaction occurring within the last 10 years: No If all of the above answers are "NO", then may proceed with Cephalosporin use.        Medication List        Accurate as of March 30, 2021  2:27 PM. If you have any questions, ask your nurse or doctor.          STOP taking these medications    budesonide-formoterol 80-4.5 MCG/ACT inhaler Commonly known as: Symbicort Stopped by: Coralyn Helling, MD       TAKE these medications    albuterol (2.5 MG/3ML) 0.083% nebulizer  solution Commonly known as: PROVENTIL Take 3 mLs (2.5 mg total) by nebulization every 6 (six) hours as needed for wheezing or shortness of breath.   albuterol 108 (90 Base) MCG/ACT inhaler Commonly known as: ProAir HFA Inhale 2 puffs into the lungs 4 (four) times daily as needed for wheezing or shortness of breath.   aspirin EC 325 MG tablet Take 325 mg by mouth daily.   Breztri Aerosphere 160-9-4.8 MCG/ACT Aero Generic drug: Budeson-Glycopyrrol-Formoterol Inhale 2 puffs into the lungs 2 (two) times daily. Started by: Coralyn Helling, MD   cetirizine 10 MG tablet Commonly known as: ZYRTEC Take 10 mg by mouth as needed.   famotidine 20 MG tablet Commonly known as: Pepcid One after supper   fluticasone 50 MCG/ACT nasal spray Commonly known as: FLONASE Place 1 spray into both nostrils daily for 14 days.   furosemide 40 MG tablet Commonly known as: LASIX Take 40 mg by mouth daily.   ibuprofen 800 MG tablet Commonly known as: ADVIL TK 1 T PO BID PRN   losartan-hydrochlorothiazide 50-12.5 MG tablet Commonly known as: HYZAAR TAKE 1 TABLET BY MOUTH EVERY DAY   Mitigare 0.6 MG Caps Generic drug: Colchicine Take 1 capsule by mouth 2 (two) times daily as needed.   montelukast 10 MG tablet Commonly known as: SINGULAIR Take 1 tablet (10 mg total) by mouth at bedtime. Started by: Coralyn Helling, MD   Norel AD 4-10-325 MG Tabs Generic drug: Chlorphen-PE-Acetaminophen Take by mouth 3 times/day as needed-between meals & bedtime.   pantoprazole 40 MG tablet Commonly known as: PROTONIX Take 40 mg by mouth daily before breakfast.   potassium chloride 10 MEQ tablet Commonly known as: KLOR-CON Take 10 mEq by mouth daily.   tiZANidine 4 MG tablet Commonly known as: ZANAFLEX Take 4 mg by mouth every 6 (six) hours as needed for muscle spasms.   UNABLE TO FIND Med Name: CPAP with 3lpm o2  [facility]   Vitamin D (Ergocalciferol) 1.25 MG (50000 UNIT) Caps capsule Commonly known as:  DRISDOL Take 50,000 Units by mouth once a week.        Signature:  Coralyn Helling, MD [facility] Pager - [phone] 03/30/2021, 2:27 PM

## 2021-03-30 NOTE — Addendum Note (Signed)
Addended by: Arvilla Market on: 03/30/2021 03:03 PM   Modules accepted: Orders

## 2021-04-23 ENCOUNTER — Ambulatory Visit: Payer: Medicaid Other | Admitting: Internal Medicine

## 2021-04-23 NOTE — Progress Notes (Deleted)
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 Ginette [name]. Pt states he has always has had SOB and bronchitis since he was a child, but has had increased SOB for the past 3-4 yrs. He states sometimes he gets winded just walking from room to room at home. He is using proair 2 x daily on and albuterol neb 1 x daily on average.   Gradually worse breathing = lifelong and better on 02 / nebs but freq just sob across the room  Doe = MMRC3 = can't walk 100 yards even at a slow pace at a flat grade s stopping due to sob  /02 2lpm  But not while on cpap as the machine is old and no adaptor for it "they said they would call but never did" rec Please see patient coordinator before you leave today  to schedule CPAP titration study> bipap per Craige Cotta Stop lisinopril and start losartan 50-12.5 and  Your breathing should gradually improve Only use your albuterol (PROAIR) as a rescue med Only use Albuterol neb if you first try the proair    12/03/2017  f/u ov/Kurt [name] re: doe with pfts c/w MO only  Chief Complaint  Patient presents with   Follow-up    Breathing is overall doing well. He has occ cough in the mornings with light yellow sputum. He uses his albuterol inhaler once per wk on average and has not had to use neb.   Dyspnea:  Improved to [facility] = can't walk a nl pace on a flat grade s sob but does fine slow and  flat better on port 02 than off  Cough: min am mucus Sleep: fine on bipap and 02 SABA use:  As above, never noct rec Wt loss    01/13/2019 acute extended ov/Kurt [name] re: worse doe  x 2 month/ indolent onset/ gradaully progressive  Chief Complaint  Patient presents with   Acute Visit    Increased SOB over the past 2 months. He states he gets SOB walking from parking lot to building. He occ has cough with tan sputum.   no prolem sitting /sleeping on cpap and 02  Main issue is doe worse in heat but also at [employer] can't do but 2 aisle then has to stop MMRC3 = can't walk 100 yards even at a slow pace at a flat grade s stopping due to sob   More cough since doe but min prod tan mucus, daytime only  Better p neb last used neb two days prior to OV   No more leg swelling than usual  Does fine on cpap and 02  Had "full labs" one day prior to ov at pcp office hasn't heard any results yet rec Pantoprazole (protonix) 40 mg   Take  30-60 min before first meal  of the day and Pepcid (famotidine)  20 mg one after supper    Plan A = Automatic = Dulera 100 Take 2 puffs first thing in am and then another 2 puffs about 12 hours later.  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   much less need for albuterol  Dyspnea:  MMRC3 = can't walk 100 yards even at a slow pace  at a flat grade s stopping due to sob/ no chest tightness  Cough: brownish mucus esp in am  Sleeping: fine on cpap and 02 3lpm  SABA use: none now  02: only at hs  rec zpak  Continue dulera 100 (symbicort 80) Take 2 puffs first thing in am and then another 2 puffs about 12 hours later.      03/09/2019  f/u ov/Kurt [name] re: sob related to asthma and obesity/ did not bring all meds   Chief Complaint  Patient presents with   Follow-up    Breathing has not improved. He is using his albuterol inhaler 4 x per day and neb about 4 x per wk. He had cough this am with blood specks in it.   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  Work on inhaler technique:    Plan B = Backup Only use your albuterol inhaler  Plan C = Crisis - only use your albuterol nebulizer if you first try Plan B Walk up 30 min but make sure your have 02 over 90% - pace yourself    04/20/2019  f/u ov/Kurt [name] re: asthma/ obesity   Chief Complaint  Patient presents with   Follow-up    Breathing is about the same. He is using his albuterol inhaler 5 x per wk on average and he has not used his neb since the last visit.   Dyspnea:  MMRC2 = can't walk a nl pace on a flat grade s sob but does fine slow and flat  Cough: none  Sleeping: sleeps on back/ on cpap, two pillows SABA use: up to once a day, just the inhaler, not the neb 02: 3lpm hs on cpap  rec Work on inhaler  technique:   To get the most out of exercise, you need to be continuously aware that you are short of  breath, but never out of breath, for 20- 30 minutes daily.  Make sure you check your oxygen saturations at highest level of activity to be sure it stays over 90% and adjust portable 02 flow  upward to maintain this level if needed but remember to turn it back to previous settings when you stop (to conserve your supply).    07/27/2019  f/u ov/Onie Hayashi re: asthma/ mo/ osa on noct cpap/02  Chief Complaint  Patient presents with   Follow-up    Had asthma attack approx 1 wk ago- woke him up and had to take off his CPAP and had to use his albuterol inhaler and neb later that day. He has noticed some wheezing recently.   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 Cough: none Sleeping: cpap, bed flat 3 pillows  SABA use: once a day when not wearing 02/ very rarely  02: 3lpm cpap prn/ uses POC 2-3 pulsed POC  With sats low 90s when working around  Rec Work on inhaler technique:  r Only use your albuterol as a rescue medication  Also ok to Try albuterol 15 min before an activity  Make sure you check your oxygen saturations at highest level of activity    03/30/21 Sood rec Try using breztri two puffs in the morning and two puffs in the evening, and rinse your mouth after each use  Don't use symbicort when using breztri Singulair 10 mg nightly   .04/23/2021  f/u ov/Kana Reimann re: asthma/mo/osa on cpap    maint on ***  No  chief complaint on file.   Dyspnea:  *** Cough: *** Sleeping: *** SABA use: *** 02: *** Covid status:   ***   No obvious day to day or daytime variability or assoc excess/ purulent sputum or mucus plugs or hemoptysis or cp or chest tightness, subjective wheeze or overt sinus or hb symptoms.   *** without nocturnal  or early am exacerbation  of respiratory  c/o's or need for noct saba. Also denies any obvious fluctuation of symptoms with weather or environmental changes or other aggravating or alleviating factors except as outlined above   No unusual exposure hx or h/o childhood pna/ asthma or knowledge of premature birth.  Current Allergies, Complete Past Medical History, Past Surgical History, Family History, and Social History were reviewed in [employer] record.  ROS  The following are not active complaints unless bolded Hoarseness, sore throat, dysphagia, dental problems, itching, sneezing,  nasal congestion or discharge of excess mucus or purulent secretions, ear ache,   fever, chills, sweats, unintended wt loss or wt gain, classically pleuritic or exertional cp,  orthopnea pnd or arm/hand swelling  or leg  swelling, presyncope, palpitations, abdominal pain, anorexia, nausea, vomiting, diarrhea  or change in bowel habits or change in bladder habits, change in stools or change in urine, dysuria, hematuria,  rash, arthralgias, visual complaints, headache, numbness, weakness or ataxia or problems with walking or coordination,  change in mood or  memory.        No outpatient medications have been marked as taking for the 04/23/21 encounter (Appointment) with Nyoka Cowden, MD.                        Objective:   Physical Exam  04/23/2021       ***  05/01/2020       274 07/27/2019         316  04/20/2019         314  03/09/2019             312  01/26/2019         309  01/13/2019           307   12/03/2017        398   09/03/17 (!) 306 lb (138.8 kg)   08/03/17 (!) 304 lb (137.9 kg)  06/22/17 (!) 305 lb 11.2 oz (138.7 kg)      Vital signs reviewed  04/23/2021  - Note at rest 02 sats  ***% on ***   General appearance:    ***      Trace sym pitting both LE's ***        Assessment:

## 2021-05-04 ENCOUNTER — Ambulatory Visit: Payer: Medicaid Other | Admitting: Internal Medicine

## 2021-05-04 NOTE — Progress Notes (Deleted)
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 Ginette Otto. Pt states he has always has had SOB and bronchitis since he was a child, but has had increased SOB for the past 3-4 yrs. He states sometimes he gets winded just walking from room to room at home. He is using proair 2 x daily on and albuterol neb 1 x daily on average.   Gradually worse breathing = lifelong and better on 02 / nebs but freq just sob across the room  Doe = MMRC3 = can't walk 100 yards even at a slow pace at a flat grade s stopping due to sob  /02 2lpm  But not while on cpap as the machine is old and no adaptor for it "they said they would call but never did" rec Please see patient coordinator before you leave today  to schedule CPAP titration study> bipap per Craige Cotta Stop lisinopril and start losartan 50-12.5 and  Your breathing should gradually improve Only use your albuterol (PROAIR) as a rescue med Only use Albuterol neb if you first try the proair    12/03/2017  f/u ov/[name] re: doe with pfts c/w MO only  Chief Complaint  Patient presents with   Follow-up    Breathing is overall doing well. He has occ cough in the mornings with light yellow sputum. He uses his albuterol inhaler once per wk on average and has not had to use neb.   Dyspnea:  Improved to [facility] = can't walk a nl pace on a flat grade s sob but does fine slow and  flat better on port 02 than off  Cough: min am mucus Sleep: fine on bipap and 02 SABA use:  As above, never noct rec Wt loss    01/13/2019 acute extended ov/[name] re: worse doe  x 2 month/ indolent onset/ gradaully progressive  Chief Complaint  Patient presents with   Acute Visit    Increased SOB over the past 2 months. He states he gets SOB walking from parking lot to building. He occ has cough with tan sputum.   no prolem sitting /sleeping on cpap and 02  Main issue is doe worse in heat but also at [employer] can't do but 2 aisle then has to stop MMRC3 = can't walk 100 yards even at a slow pace at a flat grade s stopping due to sob   More cough since doe but min prod tan mucus, daytime only  Better p neb last used neb two days prior to OV   No more leg swelling than usual  Does fine on cpap and 02  Had "full labs" one day prior to ov at pcp office hasn't heard any results yet rec Pantoprazole (protonix) 40 mg   Take  30-60 min before first meal of  the day and Pepcid (famotidine)  20 mg one after supper    Plan A = Automatic = Dulera 100 Take 2 puffs first thing in am and then another 2 puffs about 12 hours later.  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   much less need for albuterol  Dyspnea:  MMRC3 = can't walk 100 yards even at a slow pace  at a flat grade s stopping due to sob/ no chest tightness  Cough: brownish mucus esp in am  Sleeping: fine on cpap and 02 3lpm  SABA use: none now  02: only at hs  rec zpak  Continue dulera 100 (symbicort 80) Take 2 puffs first thing in am and then another 2 puffs about 12 hours later.      03/09/2019  f/u ov/[name] re: sob related to asthma and obesity/ did not bring all meds   Chief Complaint  Patient presents with   Follow-up    Breathing has not improved. He is using his albuterol inhaler 4 x per day and neb about 4 x per wk. He had cough this am with blood specks in it.   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  Work on inhaler technique:    Plan B = Backup Only use your albuterol inhaler  Plan C = Crisis - only use your albuterol nebulizer if you first try Plan B Walk up 30 min but make sure your have 02 over 90% - pace yourself    04/20/2019  f/u ov/[name] re: asthma/ obesity   Chief Complaint  Patient presents with   Follow-up    Breathing is about the same. He is using his albuterol inhaler 5 x per wk on average and he has not used his neb since the last visit.   Dyspnea:  MMRC2 = can't walk a nl pace on a flat grade s sob but does fine slow and flat  Cough: none  Sleeping: sleeps on back/ on cpap, two pillows SABA use: up to once a day, just the inhaler, not the neb 02: 3lpm hs on cpap  rec Work on inhaler technique:  To get the most out of exercise, you need to be continuously aware that you are short of  breath, but never out of breath, for 20- 30 minutes daily.  Make sure you check your oxygen saturations at highest level of activity to be sure it stays over 90% and adjust portable 02 flow  upward to maintain this level if needed but remember to turn it back to previous settings when you stop (to conserve your supply).    07/27/2019  f/u ov/Renee Erb re: asthma/ mo/ osa on noct cpap/02  Chief Complaint  Patient presents with   Follow-up    Had asthma attack approx 1 wk ago- woke him up and had to take off his CPAP and had to use his albuterol inhaler and neb later that day. He has noticed some wheezing recently.   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 Cough: none Sleeping: cpap, bed flat 3 pillows  SABA use: once a day when not wearing 02/ very rarely  02: 3lpm cpap prn/ uses POC 2-3 pulsed POC  With sats low 90s when working around  Rec Work on inhaler technique:  r Only use your albuterol as a rescue medication  Also ok to Try albuterol 15 min before an activity  Make sure you check your oxygen saturations at highest level of activity    03/30/21 Sood rec Try using breztri two puffs in the morning and two puffs in the evening, and rinse your mouth after each use  Don't use symbicort when using breztri Singulair 10 mg nightly   .05/04/2021  f/u ov/Maesyn Frisinger re: asthma/mo/osa on cpap    maint on ***  No  chief complaint on file.   Dyspnea:  *** Cough: *** Sleeping: *** SABA use: *** 02: *** Covid status:   ***   No obvious day to day or daytime variability or assoc excess/ purulent sputum or mucus plugs or hemoptysis or cp or chest tightness, subjective wheeze or overt sinus or hb symptoms.   *** without nocturnal  or early am exacerbation  of respiratory  c/o's or need for noct saba. Also denies any obvious fluctuation of symptoms with weather or environmental changes or other aggravating or alleviating factors except as outlined above   No unusual exposure hx or h/o childhood pna/ asthma or knowledge of premature birth.  Current Allergies, Complete Past Medical History, Past Surgical History, Family History, and Social History were reviewed in [employer] record.  ROS  The following are not active complaints unless bolded Hoarseness, sore throat, dysphagia, dental problems, itching, sneezing,  nasal congestion or discharge of excess mucus or purulent secretions, ear ache,   fever, chills, sweats, unintended wt loss or wt gain, classically pleuritic or exertional cp,  orthopnea pnd or arm/hand swelling  or leg swelling, presyncope, palpitations,  abdominal pain, anorexia, nausea, vomiting, diarrhea  or change in bowel habits or change in bladder habits, change in stools or change in urine, dysuria, hematuria,  rash, arthralgias, visual complaints, headache, numbness, weakness or ataxia or problems with walking or coordination,  change in mood or  memory.        No outpatient medications have been marked as taking for the 05/04/21 encounter (Appointment) with Nyoka Cowden, MD.                        Objective:   Physical Exam  05/04/2021       ***  05/01/2020       274 07/27/2019         316  04/20/2019         314  03/09/2019             312  01/26/2019         309  01/13/2019           307   12/03/2017        398   09/03/17 (!) 306 lb (138.8 kg)   08/03/17 (!) 304 lb (137.9 kg)  06/22/17 (!) 305 lb 11.2 oz (138.7 kg)      Vital signs reviewed  05/04/2021  - Note at rest 02 sats  ***% on ***   General appearance:    ***      Trace sym pitting both LE's ***        Assessment:

## 2021-06-02 IMAGING — DX CHEST - 2 VIEW
2 series · 2 of 2 positions shown · non-contrast
Comparison: Radiograph 06/20/2017

CLINICAL DATA: Shortness of breath, dyspnea on exertion.

EXAM:
CHEST - 2 VIEW

[chest pa]
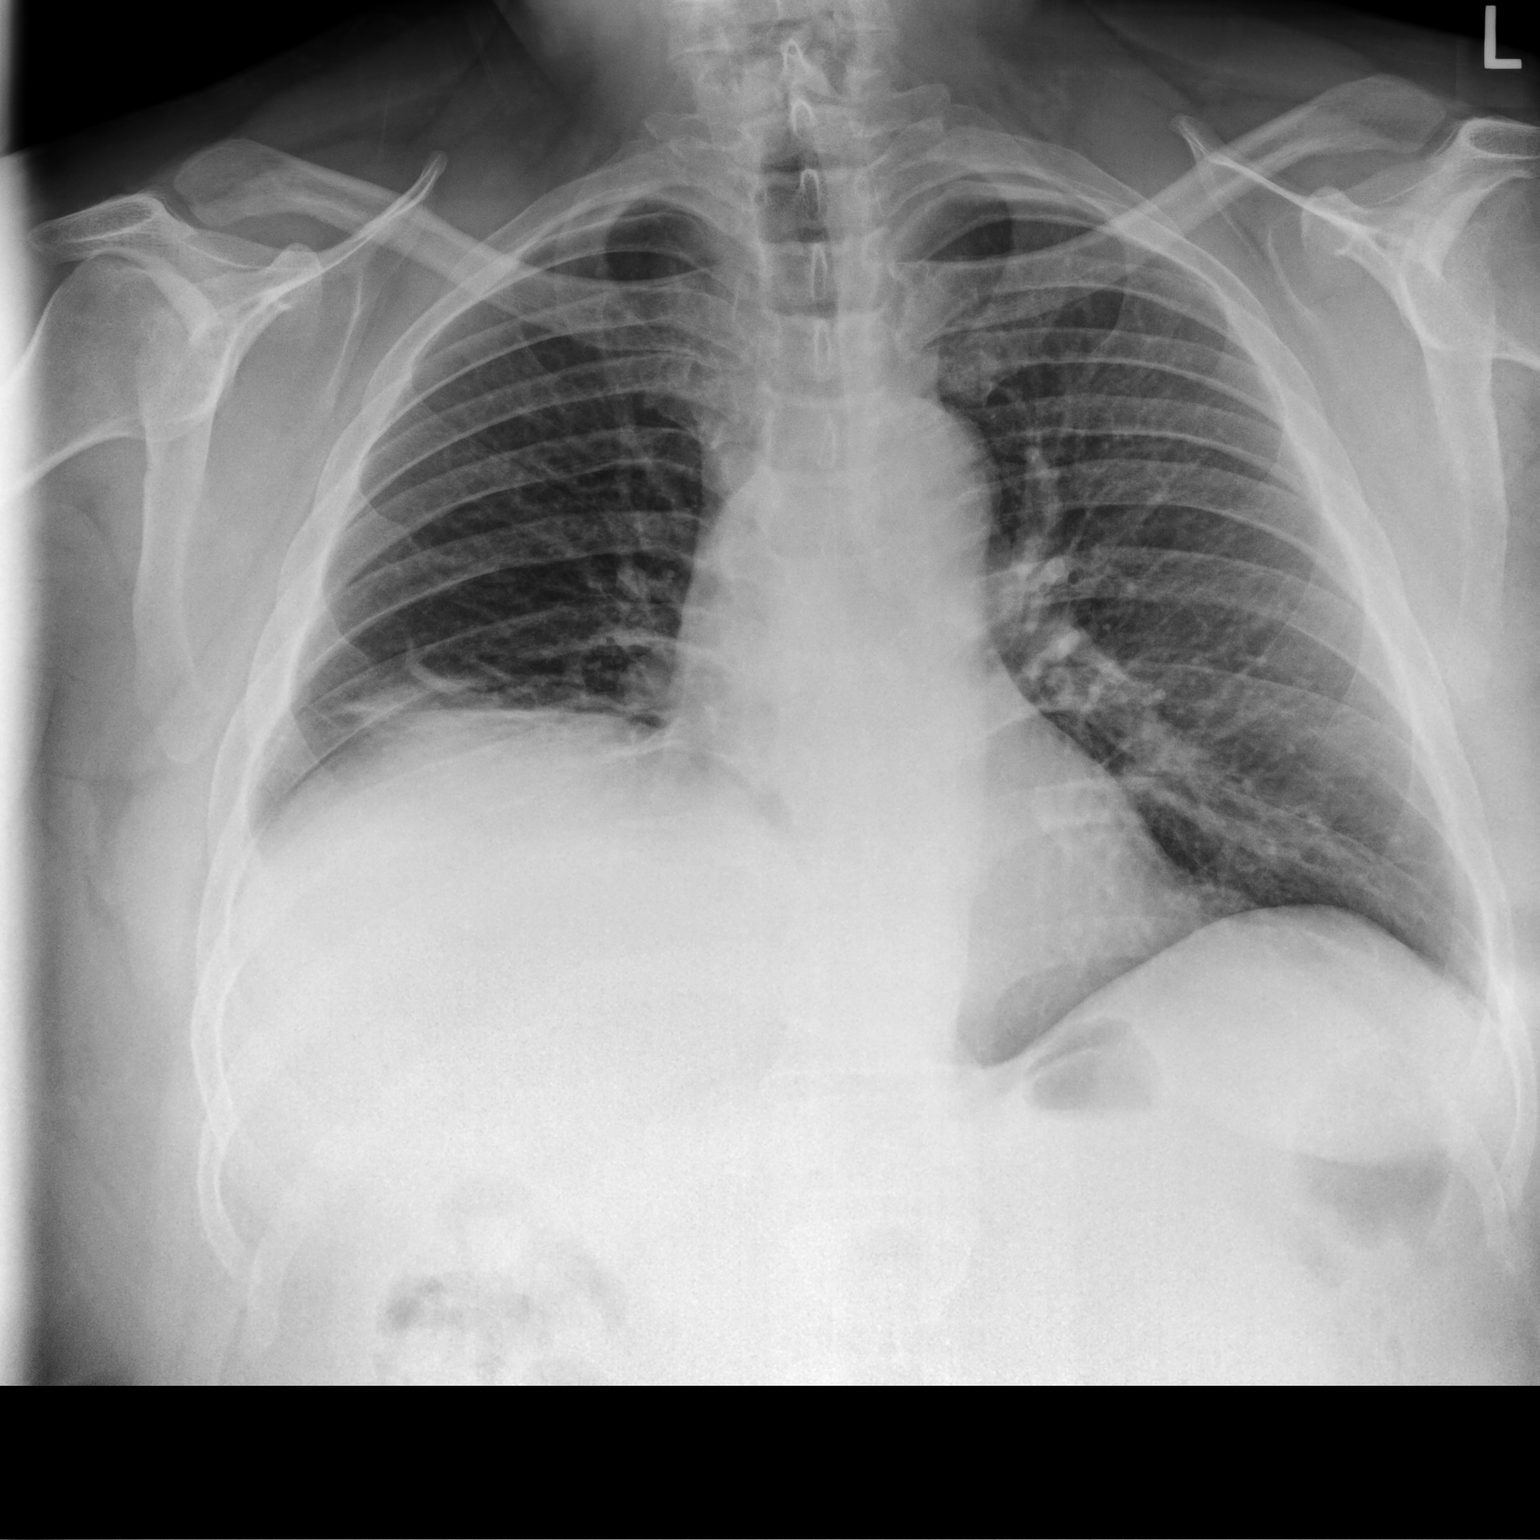

[chest lat]
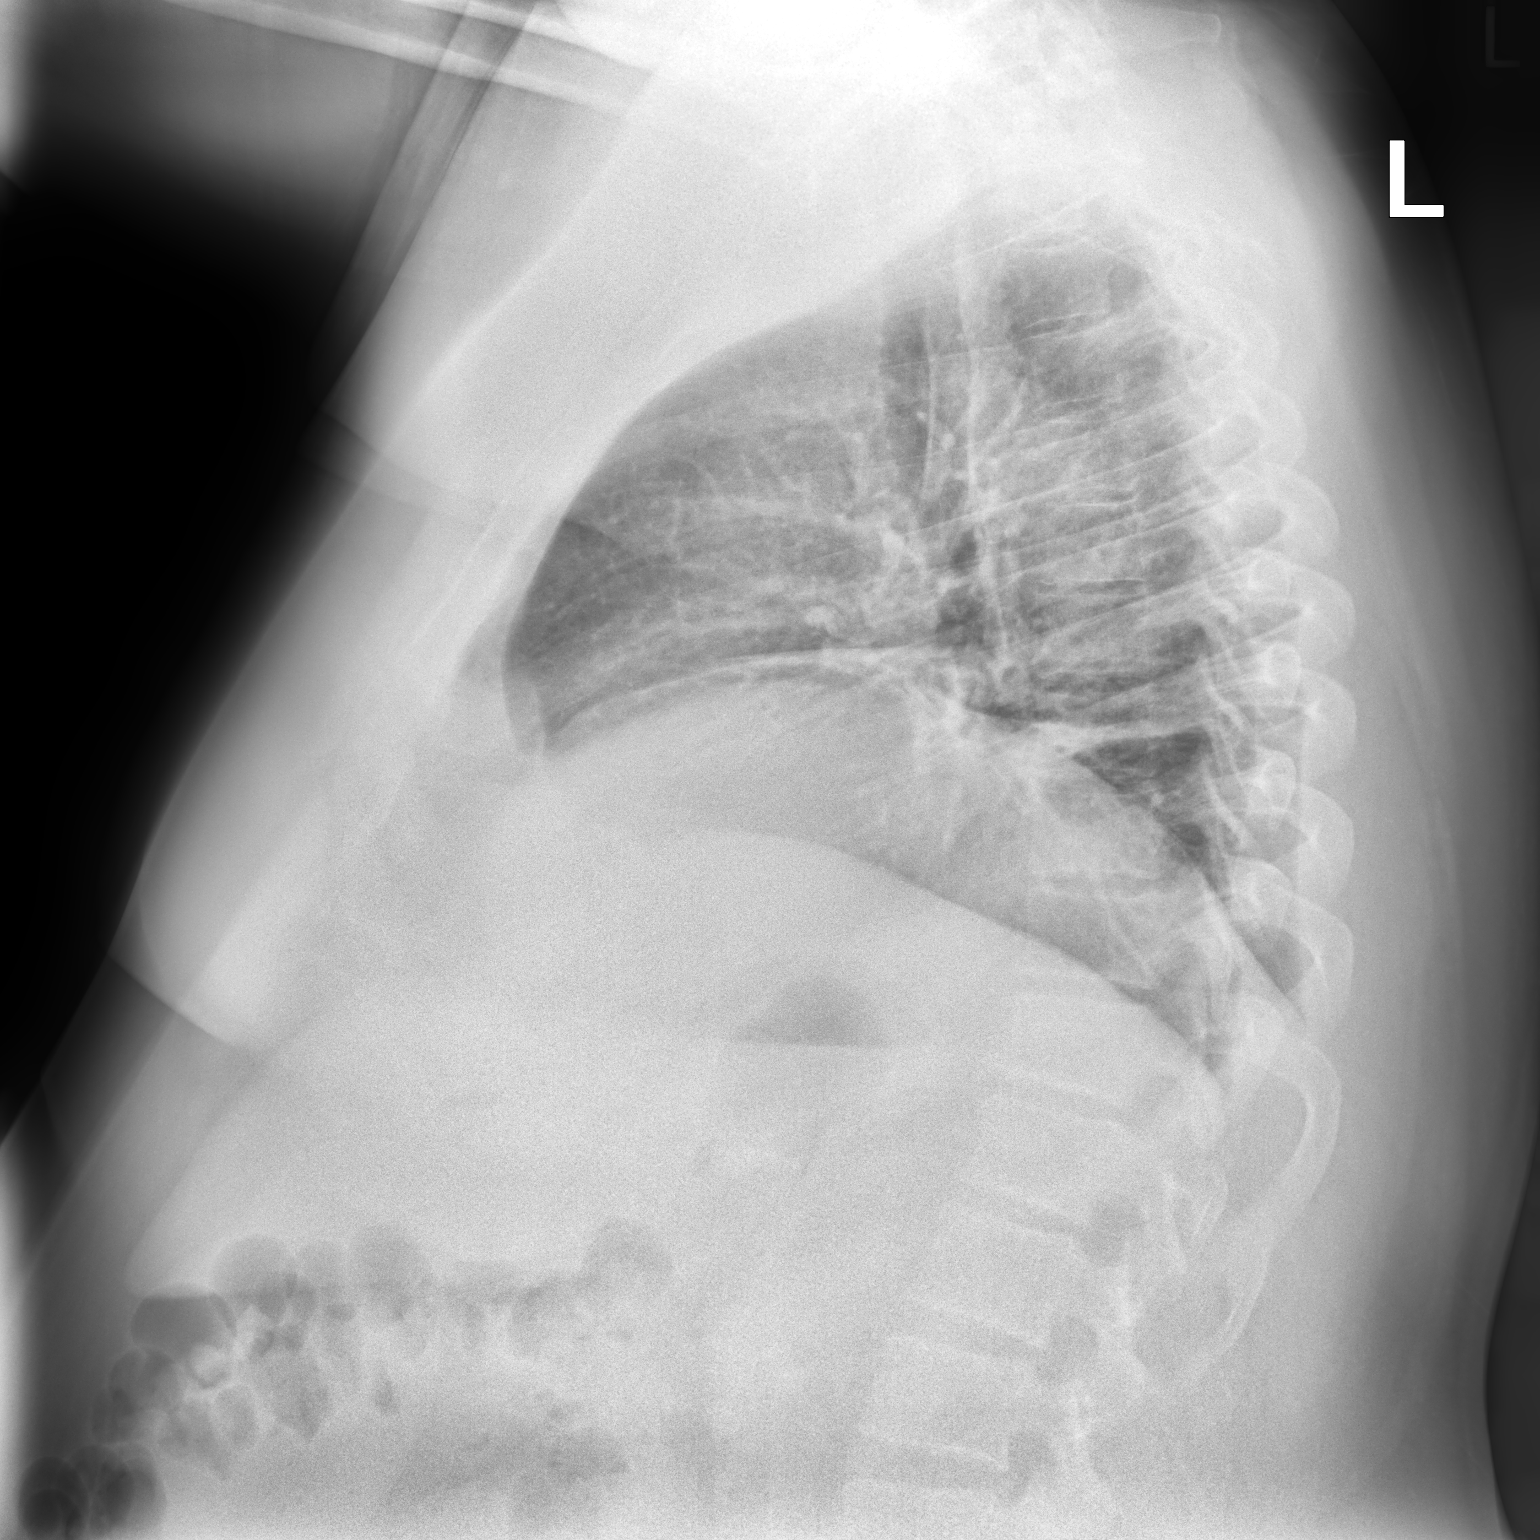

[2 of 2 positions shown; findings below may reference images not displayed]

FINDINGS: Stable elevation of the right hemidiaphragm with adjacent bandlike
areas of platelike atelectasis. No consolidation, features of edema,
pneumothorax, or effusion. Cardiomediastinal contours are stable
from prior. No acute osseous or soft tissue abnormality.
IMPRESSION: Chronic elevation the right hemidiaphragm with adjacent bandlike
opacities likely reflecting atelectasis. No other acute
cardiopulmonary abnormality.

## 2021-06-13 ENCOUNTER — Ambulatory Visit: Payer: Medicaid Other | Admitting: Internal Medicine

## 2021-06-13 NOTE — Progress Notes (Deleted)
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 Luretha Rued. Pt states he has always has had SOB and bronchitis since he was a child, but has had increased SOB for the past 3-4 yrs. He states sometimes he gets winded just walking from room to room at home. He is using proair 2 x daily on and albuterol neb 1 x daily on average.   Gradually worse breathing = lifelong and better on 02 / nebs but freq just sob across the room  Doe = MMRC3 = can't walk 100 yards even at a slow pace at a flat grade s stopping due to sob  /02 2lpm  But not while on cpap as the machine is old and no adaptor for it "they said they would call but never did" rec Please see patient coordinator before you leave today  to schedule CPAP titration study> bipap per Halford Chessman Stop lisinopril and start losartan 50-12.5 and  Your breathing should gradually improve Only use your albuterol (PROAIR) as a rescue med Only use Albuterol neb if you first try the proair    12/03/2017  f/u ov/Kurt [name] re: doe with pfts c/w MO only  Chief Complaint  Patient presents with   Follow-up    Breathing is overall doing well. He has occ cough in the mornings with light yellow sputum. He uses his albuterol inhaler once per wk on average and has not had to use neb.   Dyspnea:  Improved to [facility] = can't walk a nl pace on a flat grade s sob but does fine slow and  flat better on port 02 than off  Cough: min am mucus Sleep: fine on bipap and 02 SABA use:  As above, never noct rec Wt loss    01/13/2019 acute extended ov/Kurt [name] re: worse doe  x 2 month/ indolent onset/ gradaully progressive  Chief Complaint  Patient presents with   Acute Visit    Increased SOB over the past 2 months. He states he gets SOB walking from parking lot to building. He occ has cough with tan sputum.   no prolem sitting /sleeping on cpap and 02  Main issue is doe worse in heat but also at [employer] can't do but 2 aisle then has to stop MMRC3 = can't walk 100 yards even at a slow pace at a flat grade s stopping due to sob   More cough since doe but min prod tan mucus, daytime only  Better p neb last used neb two days prior to OV   No more leg swelling than usual  Does fine on cpap and 02  Had "full labs" one day prior to ov at pcp office hasn't heard any results yet rec Pantoprazole (protonix) 40 mg   Take  30-60 min before first meal  of the day and Pepcid (famotidine)  20 mg one after supper    Plan A = Automatic = Dulera 100 Take 2 puffs first thing in am and then another 2 puffs about 12 hours later.  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   much less need for albuterol  Dyspnea:  MMRC3 = can't walk 100 yards even at a slow pace  at a flat grade s stopping due to sob/ no chest tightness  Cough: brownish mucus esp in am  Sleeping: fine on cpap and 02 3lpm  SABA use: none now  02: only at hs  rec zpak  Continue dulera 100 (symbicort 80) Take 2 puffs first thing in am and then another 2 puffs about 12 hours later.      03/09/2019  f/u ov/Kurt [name] re: sob related to asthma and obesity/ did not bring all meds   Chief Complaint  Patient presents with   Follow-up    Breathing has not improved. He is using his albuterol inhaler 4 x per day and neb about 4 x per wk. He had cough this am with blood specks in it.   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  Work on inhaler technique:    Plan B = Backup Only use your albuterol inhaler  Plan C = Crisis - only use your albuterol nebulizer if you first try Plan B Walk up 30 min but make sure your have 02 over 90% - pace yourself    04/20/2019  f/u ov/Kurt [name] re: asthma/ obesity   Chief Complaint  Patient presents with   Follow-up    Breathing is about the same. He is using his albuterol inhaler 5 x per wk on average and he has not used his neb since the last visit.   Dyspnea:  MMRC2 = can't walk a nl pace on a flat grade s sob but does fine slow and flat  Cough: none  Sleeping: sleeps on back/ on cpap, two pillows SABA use: up to once a day, just the inhaler, not the neb 02: 3lpm hs on cpap  rec Work on inhaler  technique:   To get the most out of exercise, you need to be continuously aware that you are short of  breath, but never out of breath, for 20- 30 minutes daily.  Make sure you check your oxygen saturations at highest level of activity to be sure it stays over 90% and adjust portable 02 flow  upward to maintain this level if needed but remember to turn it back to previous settings when you stop (to conserve your supply).    07/27/2019  f/u ov/Kurt [name] re: asthma/ mo/ osa on noct cpap/02  Chief Complaint  Patient presents with   Follow-up    Had asthma attack approx 1 wk ago- woke him up and had to take off his CPAP and had to use his albuterol inhaler and neb later that day. He has noticed some wheezing recently.   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 Sherene Sires in October 2022 Follow up with Dr. Craige Cotta in 6 months    06/13/2021  f/u ov/Kurt [name] re: ***   maint on ***  No chief complaint on file.   Dyspnea:  *** Cough: *** Sleeping: *** SABA use: *** 02: *** Covid status:   ***   No obvious day to day or daytime variability or assoc  excess/ purulent sputum or mucus plugs or hemoptysis or cp or chest tightness, subjective wheeze or overt sinus or hb symptoms.   *** without nocturnal  or early am exacerbation  of respiratory  c/o's or need for noct saba. Also denies any obvious fluctuation of symptoms with weather or environmental changes or other aggravating or alleviating factors except as outlined above   No unusual exposure hx or h/o childhood pna/ asthma or knowledge of premature birth.  Current Allergies, Complete Past Medical History, Past Surgical History, Family History, and Social History were reviewed in [employer] record.  ROS  The following are not active complaints unless bolded Hoarseness, sore throat, dysphagia, dental problems, itching, sneezing,  nasal congestion or discharge of excess mucus or purulent secretions, ear ache,   fever, chills, sweats, unintended wt loss or wt gain, classically pleuritic or exertional cp,  orthopnea pnd or arm/hand swelling  or leg swelling, presyncope, palpitations, abdominal pain, anorexia, nausea, vomiting, diarrhea  or change in bowel habits or change in bladder habits, change in stools or change in urine, dysuria, hematuria,  rash, arthralgias, visual complaints, headache, numbness, weakness or ataxia or problems with walking or coordination,  change in mood or  memory.        No outpatient medications have been marked as taking for the 06/13/21 encounter (Appointment) with Tanda Rockers, MD.                           Objective:   Physical Exam  Wt 06/13/2021         ***  05/01/2020       274 07/27/2019         316  04/20/2019         314  03/09/2019             312  01/26/2019         309  01/13/2019           307   12/03/2017        398   09/03/17 (!) 306 lb (138.8 kg)  08/03/17 (!) 304 lb (137.9 kg)  06/22/17 (!) 305 lb 11.2 oz (138.7 kg)   Vital signs reviewed  06/13/2021  - Note at rest 02 sats  ***% on ***   General  appearance:    ***      Trace sym pitting both LE's ***     Assessment:

## 2021-08-15 ENCOUNTER — Ambulatory Visit: Payer: Medicaid Other | Admitting: Internal Medicine

## 2021-08-15 NOTE — Progress Notes (Deleted)
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 Luretha Rued. Pt states he has always has had SOB and bronchitis since he was a child, but has had increased SOB for the past 3-4 yrs. He states sometimes he gets winded just walking from room to room at home. He is using proair 2 x daily on and albuterol neb 1 x daily on average.   Gradually worse breathing = lifelong and better on 02 / nebs but freq just sob across the room  Doe = MMRC3 = can't walk 100 yards even at a slow pace at a flat grade s stopping due to sob  /02 2lpm  But not while on cpap as the machine is old and no adaptor for it "they said they would call but never did" rec Please see patient coordinator before you leave today  to schedule CPAP titration study> bipap per Halford Chessman Stop lisinopril and start losartan 50-12.5 and  Your breathing should gradually improve Only use your albuterol (PROAIR) as a rescue med Only use Albuterol neb if you first try the proair    12/03/2017  f/u ov/Tiersa Dayley re: doe with pfts c/w MO only  Chief Complaint  Patient presents with   Follow-up    Breathing is overall doing well. He has occ cough in the mornings with light yellow sputum. He uses his albuterol inhaler once per wk on average and has not had to use neb.   Dyspnea:  Improved to [facility] = can't walk a nl pace on a flat grade s sob but does fine slow and  flat better on port 02 than off  Cough: min am mucus Sleep: fine on bipap and 02 SABA use:  As above, never noct rec Wt loss    01/13/2019 acute extended ov/Nancyjo Givhan re: worse doe  x 2 month/ indolent onset/ gradaully progressive  Chief Complaint  Patient presents with   Acute Visit    Increased SOB over the past 2 months. He states he gets SOB walking from parking lot to building. He occ has cough with tan sputum.   no prolem sitting /sleeping on cpap and 02  Main issue is doe worse in heat but also at [employer] can't do but 2 aisle then has to stop MMRC3 = can't walk 100 yards even at a slow pace at a flat grade s stopping due to sob   More cough since doe but min prod tan mucus, daytime only  Better p neb last used neb two days prior to OV   No more leg swelling than usual  Does fine on cpap and 02  Had "full labs" one day prior to ov at pcp office hasn't heard any results yet rec Pantoprazole (protonix) 40 mg   Take  30-60 min before first meal  of the day and Pepcid (famotidine)  20 mg one after supper    Plan A = Automatic = Dulera 100 Take 2 puffs first thing in am and then another 2 puffs about 12 hours later.  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   much less need for albuterol  Dyspnea:  MMRC3 = can't walk 100 yards even at a slow pace  at a flat grade s stopping due to sob/ no chest tightness  Cough: brownish mucus esp in am  Sleeping: fine on cpap and 02 3lpm  SABA use: none now  02: only at hs  rec zpak  Continue dulera 100 (symbicort 80) Take 2 puffs first thing in am and then another 2 puffs about 12 hours later.      03/09/2019  f/u ov/Renad Jenniges re: sob related to asthma and obesity/ did not bring all meds   Chief Complaint  Patient presents with   Follow-up    Breathing has not improved. He is using his albuterol inhaler 4 x per day and neb about 4 x per wk. He had cough this am with blood specks in it.   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  Work on inhaler technique:    Plan B = Backup Only use your albuterol inhaler  Plan C = Crisis - only use your albuterol nebulizer if you first try Plan B Walk up 30 min but make sure your have 02 over 90% - pace yourself    04/20/2019  f/u ov/Jassiah Viviano re: asthma/ obesity   Chief Complaint  Patient presents with   Follow-up    Breathing is about the same. He is using his albuterol inhaler 5 x per wk on average and he has not used his neb since the last visit.   Dyspnea:  MMRC2 = can't walk a nl pace on a flat grade s sob but does fine slow and flat  Cough: none  Sleeping: sleeps on back/ on cpap, two pillows SABA use: up to once a day, just the inhaler, not the neb 02: 3lpm hs on cpap  rec Work on inhaler  technique:   To get the most out of exercise, you need to be continuously aware that you are short of  breath, but never out of breath, for 20- 30 minutes daily.  Make sure you check your oxygen saturations at highest level of activity to be sure it stays over 90% and adjust portable 02 flow  upward to maintain this level if needed but remember to turn it back to previous settings when you stop (to conserve your supply).    07/27/2019  f/u ov/Aston Lieske re: asthma/ mo/ osa on noct cpap/02  Chief Complaint  Patient presents with   Follow-up    Had asthma attack approx 1 wk ago- woke him up and had to take off his CPAP and had to use his albuterol inhaler and neb later that day. He has noticed some wheezing recently.   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 Make sure you check your oxygen saturations at highest level of activity Pt informed of the seriousness of COVID 19 infection as a direct risk to lung health    08/15/2021  f/u ov/Hawkins Seaman  re: aARHm/ MO/ hypox and hypercarbic RF   maint on ***  No chief complaint on file.   Dyspnea:  *** Cough: *** Sleeping: *** SABA use: *** 02: *** Covid status:   ***   No obvious day to day or daytime variability or assoc excess/ purulent sputum or mucus plugs or hemoptysis or cp or chest tightness, subjective wheeze or overt sinus or hb symptoms.   *** without nocturnal  or early am exacerbation  of respiratory  c/o's or need for noct saba. Also denies any obvious fluctuation of symptoms with weather or environmental changes or other aggravating or alleviating factors except as outlined above   No unusual exposure hx or h/o childhood pna/ asthma or knowledge of premature birth.  Current Allergies, Complete Past Medical History, Past Surgical History, Family History, and Social History were reviewed in [employer] record.  ROS  The following are not active complaints unless bolded Hoarseness, sore throat, dysphagia, dental problems, itching, sneezing,  nasal congestion or discharge of excess mucus or purulent secretions, ear ache,   fever, chills, sweats, unintended wt loss or wt gain, classically pleuritic or  exertional cp,  orthopnea pnd or arm/hand swelling  or leg swelling, presyncope, palpitations, abdominal pain, anorexia, nausea, vomiting, diarrhea  or change in bowel habits or change in bladder habits, change in stools or change in urine, dysuria, hematuria,  rash, arthralgias, visual complaints, headache, numbness, weakness or ataxia or problems with walking or coordination,  change in mood or  memory.        No outpatient medications have been marked as taking for the 08/15/21 encounter (Appointment) with Tanda Rockers, MD.                      Objective:   Physical Exam  Wts  08/15/2021           ***  05/01/2020       274 07/27/2019         316  04/20/2019         314  03/09/2019             312  01/26/2019         309  01/13/2019            307   12/03/2017        398   09/03/17 (!) 306 lb (138.8 kg)  08/03/17 (!) 304 lb (137.9 kg)  06/22/17 (!) 305 lb 11.2 oz (138.7 kg)      Vital signs reviewed  08/15/2021  - Note at rest 02 sats  ***% on ***   General appearance:    ***    Assessment:

## 2021-11-02 ENCOUNTER — Ambulatory Visit: Payer: Medicaid Other | Admitting: Internal Medicine

## 2021-11-02 NOTE — Progress Notes (Deleted)
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 Ginette Otto. Pt states he has always has had SOB and bronchitis since he was a child, but has had increased SOB for the past 3-4 yrs. He states sometimes he gets winded just walking from room to room at home. He is using proair 2 x daily on and albuterol neb 1 x daily on average.   Gradually worse breathing = lifelong and better on 02 / nebs but freq just sob across the room  Doe = MMRC3 = can't walk 100 yards even at a slow pace at a flat grade s stopping due to sob  /02 2lpm  But not while on cpap as the machine is old and no adaptor for it "they said they would call but never did" rec Please see patient coordinator before you leave today  to schedule CPAP titration study> bipap per Craige Cotta Stop lisinopril and start losartan 50-12.5 and  Your breathing should gradually improve Only use your albuterol (PROAIR) as a rescue med Only use Albuterol neb if you first try the proair    12/03/2017  f/u ov/Kurt [name] re: doe with pfts c/w MO only  Chief Complaint  Patient presents with   Follow-up    Breathing is overall doing well. He has occ cough in the mornings with light yellow sputum. He uses his albuterol inhaler once per wk on average and has not had to use neb.   Dyspnea:  Improved to [facility] = can't walk a nl pace on a flat grade s sob but does fine slow and  flat better on port 02 than off  Cough: min am mucus Sleep: fine on bipap and 02 SABA use:  As above, never noct rec Wt loss    01/13/2019 acute extended ov/Kurt [name] re: worse doe  x 2 month/ indolent onset/ gradaully progressive  Chief Complaint  Patient presents with   Acute Visit    Increased SOB over the past 2 months. He states he gets SOB walking from parking lot to building. He occ has cough with tan sputum.   no prolem sitting /sleeping on cpap and 02  Main issue is doe worse in heat but also at [employer] can't do but 2 aisle then has to stop MMRC3 = can't walk 100 yards even at a slow pace at a flat grade s stopping due to sob   More cough since doe but min prod tan mucus, daytime only  Better p neb last used neb two days prior to OV   No more leg swelling than usual  Does fine on cpap and 02  Had "full labs" one day prior to ov at pcp office hasn't heard any results yet rec Pantoprazole (protonix) 40 mg   Take  30-60 min before first meal  of the day and Pepcid (famotidine)  20 mg one after supper    Plan A = Automatic = Dulera 100 Take 2 puffs first thing in am and then another 2 puffs about 12 hours later.  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   much less need for albuterol  Dyspnea:  MMRC3 = can't walk 100 yards even at a slow pace  at a flat grade s stopping due to sob/ no chest tightness  Cough: brownish mucus esp in am  Sleeping: fine on cpap and 02 3lpm  SABA use: none now  02: only at hs  rec zpak  Continue dulera 100 (symbicort 80) Take 2 puffs first thing in am and then another 2 puffs about 12 hours later.      03/09/2019  f/u ov/Kurt [name] re: sob related to asthma and obesity/ did not bring all meds   Chief Complaint  Patient presents with   Follow-up    Breathing has not improved. He is using his albuterol inhaler 4 x per day and neb about 4 x per wk. He had cough this am with blood specks in it.   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  Work on inhaler technique:    Plan B = Backup Only use your albuterol inhaler  Plan C = Crisis - only use your albuterol nebulizer if you first try Plan B Walk up 30 min but make sure your have 02 over 90% - pace yourself    04/20/2019  f/u ov/Kurt [name] re: asthma/ obesity   Chief Complaint  Patient presents with   Follow-up    Breathing is about the same. He is using his albuterol inhaler 5 x per wk on average and he has not used his neb since the last visit.   Dyspnea:  MMRC2 = can't walk a nl pace on a flat grade s sob but does fine slow and flat  Cough: none  Sleeping: sleeps on back/ on cpap, two pillows SABA use: up to once a day, just the inhaler, not the neb 02: 3lpm hs on cpap  rec Work on inhaler  technique:   To get the most out of exercise, you need to be continuously aware that you are short of  breath, but never out of breath, for 20- 30 minutes daily.  Make sure you check your oxygen saturations at highest level of activity to be sure it stays over 90% and adjust portable 02 flow  upward to maintain this level if needed but remember to turn it back to previous settings when you stop (to conserve your supply).    07/27/2019  f/u ov/Kurt [name] re: asthma/ mo/ osa on noct cpap/02  Chief Complaint  Patient presents with   Follow-up    Had asthma attack approx 1 wk ago- woke him up and had to take off his CPAP and had to use his albuterol inhaler and neb later that day. He has noticed some wheezing recently.   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   Dyspnea:  *** Cough: *** Sleeping: *** SABA use: *** 02: *** Covid status:   ***   No obvious day to day or  daytime variability or assoc excess/ purulent sputum or mucus plugs or hemoptysis or cp or chest tightness, subjective wheeze or overt sinus or hb symptoms.   *** without nocturnal  or early am exacerbation  of respiratory  c/o's or need for noct saba. Also denies any obvious fluctuation of symptoms with weather or environmental changes or other aggravating or alleviating factors except as outlined above   No unusual exposure hx or h/o childhood pna/ asthma or knowledge of premature birth.  Current Allergies, Complete Past Medical History, Past Surgical History, Family History, and Social History were reviewed in [employer] record.  ROS  The following are not active complaints unless bolded Hoarseness, sore throat, dysphagia, dental problems, itching, sneezing,  nasal congestion or discharge of excess mucus or purulent secretions, ear ache,   fever, chills, sweats, unintended wt loss or wt gain, classically pleuritic or exertional cp,  orthopnea pnd or arm/hand swelling  or leg swelling, presyncope, palpitations, abdominal pain, anorexia, nausea, vomiting, diarrhea  or change in bowel habits or change in bladder habits, change in stools or change  in urine, dysuria, hematuria,  rash, arthralgias, visual complaints, headache, numbness, weakness or ataxia or problems with walking or coordination,  change in mood or  memory.        No outpatient medications have been marked as taking for the 11/02/21 encounter (Appointment) with Nyoka Cowden, MD.                       Objective:   Physical Exam  11/02/2021          ***  05/01/2020        274 07/27/2019         316  04/20/2019         314  03/09/2019             312  01/26/2019         309  01/13/2019           307   12/03/2017        398   09/03/17 (!) 306 lb (138.8 kg)  08/03/17 (!) 304 lb (137.9 kg)  06/22/17 (!) 305 lb 11.2 oz (138.7 kg)       Vital signs reviewed  11/02/2021  - Note at rest 02 sats  ***% on  ***   General appearance:    ***      Trace sym pitting both LE's ***         Assessment:

## 2022-01-02 NOTE — Progress Notes (Deleted)
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 Ginette Otto. Pt states he has always has had SOB and bronchitis since he was a child, but has had increased SOB for the past 3-4 yrs. He states sometimes he gets winded just walking from room to room at home. He is using proair 2 x daily on and albuterol neb 1 x daily on average.   Gradually worse breathing = lifelong and better on 02 / nebs but freq just sob across the room  Doe = MMRC3 = can't walk 100 yards even at a slow pace at a flat grade s stopping due to sob  /02 2lpm  But not while on cpap as the machine is old and no adaptor for it "they said they would call but never did" rec Please see patient coordinator before you leave today  to schedule CPAP titration study> bipap per Craige Cotta Stop lisinopril and start losartan 50-12.5 and  Your breathing should gradually improve Only use your albuterol (PROAIR) as a rescue med Only use Albuterol neb if you first try the proair    12/03/2017  f/u ov/Kurt [name] re: doe with pfts c/w MO only  Chief Complaint  Patient presents with   Follow-up    Breathing is overall doing well. He has occ cough in the mornings with light yellow sputum. He uses his albuterol inhaler once per wk on average and has not had to use neb.   Dyspnea:  Improved to [facility] = can't walk a nl pace on a flat grade s sob but does fine slow and  flat better on port 02 than off  Cough: min am mucus Sleep: fine on bipap and 02 SABA use:  As above, never noct rec Wt loss    03/09/2019  f/u ov/Kurt [name] re: sob related to asthma and obesity/ did not bring all meds   Chief Complaint  Patient presents with   Follow-up    Breathing has not improved. He is using his albuterol inhaler 4 x per day and neb about 4 x per wk. He had cough this am with blood specks in it.   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  Work on inhaler technique:    Plan B = Backup Only use your albuterol inhaler  Plan C = Crisis - only use your albuterol nebulizer if you first try Plan B Walk up 30 min but make sure your have 02 over 90% - pace yourself    04/20/2019  f/u ov/Kurt [name] re: asthma/ obesity   Chief Complaint  Patient presents with   Follow-up    Breathing is about the same. He is using his albuterol inhaler 5 x per wk on average and he has not used his neb since the last visit.   Dyspnea:  MMRC2 = can't walk a nl pace on a flat grade s sob but does fine slow and flat  Cough: none  Sleeping: sleeps on back/ on cpap, two pillows SABA use: up to once a day, just the inhaler, not the neb 02: 3lpm hs on cpap  rec Work on inhaler technique:   To get the most out of exercise, you need to be continuously aware that you are short of breath, but never out of breath, for 20- 30 minutes daily.  Make sure you check your oxygen saturations at highest level of activity to be sure it stays over 90% and  adjust portable 02 flow  upward to maintain this level if needed but remember to turn it back to previous settings when you stop (to conserve your supply).    07/27/2019  f/u ov/Kurt [name] re: asthma/ mo/ osa on noct cpap/02  Chief Complaint  Patient presents with   Follow-up    Had asthma attack approx 1 wk ago- woke him up and had to take off his CPAP and had to use his albuterol inhaler and neb later that day. He has noticed some wheezing recently.   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 Cough: none Sleeping: cpap, bed flat 3 pillows  SABA use: once a day when not wearing 02/ very rarely  02: 3lpm cpap prn/ uses POC 2-3 pulsed POC  With sats low 90s when working around  Rec Work on inhaler technique:   Only use your albuterol as a rescue medication Also ok to Try albuterol 15 min before an activity that you know would make you short of breath  Make sure you check your oxygen saturations at highest level of activity to be sure it stays over 90%     Sood Recs 03/30/21 Try using breztri two puffs in the morning and two puffs in the evening, and rinse your mouth after each use  Don't use symbicort when using breztri Singulair 10 mg nightly     01/03/2022  f/u ov/Kurt [name] re: ***   maint on ***  No chief complaint on file.   Dyspnea:  *** Cough: *** Sleeping: *** SABA use: *** 02: *** Covid status:    ***  No obvious day to day or daytime variability or assoc excess/ purulent sputum or mucus plugs or hemoptysis or cp or chest tightness, subjective wheeze or overt sinus or hb symptoms.   *** without nocturnal  or early am exacerbation  of respiratory  c/o's or need for noct saba. Also denies any obvious fluctuation of symptoms with weather or environmental changes or other aggravating or alleviating factors except as outlined above   No unusual exposure hx or h/o childhood pna/ asthma or knowledge of premature birth.  Current Allergies, Complete Past Medical History, Past Surgical History, Family History, and Social History were reviewed in [employer] record.  ROS  The following are not active complaints unless bolded Hoarseness, sore throat, dysphagia, dental problems, itching, sneezing,  nasal congestion or discharge of excess mucus or purulent secretions, ear ache,   fever, chills, sweats, unintended wt loss or wt gain, classically pleuritic or exertional cp,  orthopnea pnd or arm/hand swelling  or leg swelling, presyncope, palpitations, abdominal pain, anorexia, nausea, vomiting, diarrhea  or change in bowel habits or change in bladder habits, change in stools or change in urine, dysuria, hematuria,  rash, arthralgias, visual complaints, headache, numbness, weakness or ataxia or problems with walking or coordination,  change in mood or  memory.        No outpatient medications have been marked as taking for the 01/03/22 encounter (Appointment) with Nyoka Cowden, MD.                        Objective:   Physical Exam  Wts  05/01/2020        274 07/27/2019         316  04/20/2019         314  03/09/2019             312  01/26/2019         309  01/13/2019           307   12/03/2017        398   09/03/17 (!) 306 lb (138.8 kg)  08/03/17 (!) 304 lb (137.9 kg)  06/22/17 (!) 305 lb 11.2 oz (138.7 kg)       Vital signs reviewed  01/03/2022  - Note at  rest 02 sats  ***% on ***   General appearance:    ***     Trace sym pitting both LE's ***         Assessment:

## 2022-01-03 ENCOUNTER — Ambulatory Visit: Payer: Medicaid Other | Admitting: Internal Medicine

## 2022-01-15 ENCOUNTER — Ambulatory Visit: Payer: Medicaid Other | Admitting: Internal Medicine

## 2022-01-15 NOTE — Progress Notes (Deleted)
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 Ginette Otto. Pt states he has always has had SOB and bronchitis since he was a child, but has had increased SOB for the past 3-4 yrs. He states sometimes he gets winded just walking from room to room at home. He is using proair 2 x daily on and albuterol neb 1 x daily on average.   Gradually worse breathing = lifelong and better on 02 / nebs but freq just sob across the room  Doe = MMRC3 = can't walk 100 yards even at a slow pace at a flat grade s stopping due to sob  /02 2lpm  But not while on cpap as the machine is old and no adaptor for it "they said they would call but never did" rec Please see patient coordinator before you leave today  to schedule CPAP titration study> bipap per Craige Cotta Stop lisinopril and start losartan 50-12.5 and  Your breathing should gradually improve Only use your albuterol (PROAIR) as a rescue med Only use Albuterol neb if you first try the proair    12/03/2017  f/u ov/Kurt [name] re: doe with pfts c/w MO only  Chief Complaint  Patient presents with   Follow-up    Breathing is overall doing well. He has occ cough in the mornings with light yellow sputum. He uses his albuterol inhaler once per wk on average and has not had to use neb.   Dyspnea:  Improved to [facility] = can't walk a nl pace on a flat grade s sob but does fine slow and  flat better on port 02 than off  Cough: min am mucus Sleep: fine on bipap and 02 SABA use:  As above, never noct rec Wt loss    03/09/2019  f/u ov/Kurt [name] re: sob related to asthma and obesity/ did not bring all meds   Chief Complaint  Patient presents with   Follow-up    Breathing has not improved. He is using his albuterol inhaler 4 x per day and neb about 4 x per wk. He had cough this am with blood specks in it.   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  Work on inhaler technique:    Plan B = Backup Only use your albuterol inhaler  Plan C = Crisis - only use your albuterol nebulizer if you first try Plan B Walk up 30 min but make sure your have 02 over 90% - pace yourself    04/20/2019  f/u ov/Kurt [name] re: asthma/ obesity   Chief Complaint  Patient presents with   Follow-up    Breathing is about the same. He is using his albuterol inhaler 5 x per wk on average and he has not used his neb since the last visit.   Dyspnea:  MMRC2 = can't walk a nl pace on a flat grade s sob but does fine slow and flat  Cough: none  Sleeping: sleeps on back/ on cpap, two pillows SABA use: up to once a day, just the inhaler, not the neb 02: 3lpm hs on cpap  rec Work on inhaler technique:   To get the most out of exercise, you need to be continuously aware that you are short of breath, but never out of breath, for 20- 30 minutes daily.  Make sure you check your oxygen saturations at highest level of activity to be sure it stays over 90% and  adjust portable 02 flow  upward to maintain this level if needed but remember to turn it back to previous settings when you stop (to conserve your supply).    07/27/2019  f/u ov/Kurt [name] re: asthma/ mo/ osa on noct cpap/02  Chief Complaint  Patient presents with   Follow-up    Had asthma attack approx 1 wk ago- woke him up and had to take off his CPAP and had to use his albuterol inhaler and neb later that day. He has noticed some wheezing recently.   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 Cough: none Sleeping: cpap, bed flat 3 pillows  SABA use: once a day when not wearing 02/ very rarely  02: 3lpm cpap prn/ uses POC 2-3 pulsed POC  With sats low 90s when working around  Rec Work on inhaler technique:   Only use your albuterol as a rescue medication Also ok to Try albuterol 15 min before an activity that you know would make you short of breath  Make sure you check your oxygen saturations at highest level of activity to be sure it stays over 90%     Sood Recs 03/30/21 Try using breztri two puffs in the morning and two puffs in the evening, and rinse your mouth after each use  Don't use symbicort when using breztri Singulair 10 mg nightly     01/15/2022  f/u ov/Kurt [name] re: ***   maint on ***  No chief complaint on file.   Dyspnea:  *** Cough: *** Sleeping: *** SABA use: *** 02: *** Covid status:    ***  No obvious day to day or daytime variability or assoc excess/ purulent sputum or mucus plugs or hemoptysis or cp or chest tightness, subjective wheeze or overt sinus or hb symptoms.   *** without nocturnal  or early am exacerbation  of respiratory  c/o's or need for noct saba. Also denies any obvious fluctuation of symptoms with weather or environmental changes or other aggravating or alleviating factors except as outlined above   No unusual exposure hx or h/o childhood pna/ asthma or knowledge of premature birth.  Current Allergies, Complete Past Medical History, Past Surgical History, Family History, and Social History were reviewed in [employer] record.  ROS  The following are not active complaints unless bolded Hoarseness, sore throat, dysphagia, dental problems, itching, sneezing,  nasal congestion or discharge of excess mucus or purulent secretions, ear ache,   fever, chills, sweats, unintended wt loss or wt gain, classically pleuritic or exertional cp,  orthopnea pnd or arm/hand swelling  or leg swelling, presyncope, palpitations, abdominal pain, anorexia, nausea, vomiting, diarrhea  or change in bowel habits or change in bladder habits, change in stools or change in urine, dysuria, hematuria,  rash, arthralgias, visual complaints, headache, numbness, weakness or ataxia or problems with walking or coordination,  change in mood or  memory.        No outpatient medications have been marked as taking for the 01/15/22 encounter (Appointment) with Nyoka Cowden, MD.                        Objective:   Physical Exam  Wts  01/15/2022         ***  05/01/2020        274 07/27/2019         316  04/20/2019         314  03/09/2019             312  01/26/2019         309  01/13/2019           307   12/03/2017        398   09/03/17 (!) 306 lb (138.8 kg)  08/03/17 (!) 304 lb (137.9 kg)  06/22/17 (!) 305 lb 11.2 oz (138.7 kg)       Vital signs reviewed   01/15/2022  - Note at rest 02 sats  ***% on ***   General appearance:    ***     Trace sym pitting both LE's ***         Assessment:

## 2022-01-22 ENCOUNTER — Ambulatory Visit: Payer: Medicaid Other | Admitting: Pulmonary Disease

## 2022-01-28 ENCOUNTER — Ambulatory Visit: Payer: Medicaid Other | Admitting: Internal Medicine

## 2022-01-28 NOTE — Progress Notes (Deleted)
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 Ginette [name]. Pt states he has always has had SOB and bronchitis since he was a child, but has had increased SOB for the past 3-4 yrs. He states sometimes he gets winded just walking from room to room at home. He is using proair 2 x daily on and albuterol neb 1 x daily on average.   Gradually worse breathing = lifelong and better on 02 / nebs but freq just sob across the room  Doe = MMRC3 = can't walk 100 yards even at a slow pace at a flat grade s stopping due to sob  /02 2lpm  But not while on cpap as the machine is old and no adaptor for it "they said they would call but never did" rec Please see patient coordinator before you leave today  to schedule CPAP titration study> bipap per Craige Cotta Stop lisinopril and start losartan 50-12.5 and  Your breathing should gradually improve Only use your albuterol (PROAIR) as a rescue med Only use Albuterol neb if you first try the proair    12/03/2017  f/u ov/Kurt [name] re: doe with pfts c/w MO only  Chief Complaint  Patient presents with   Follow-up    Breathing is overall doing well. He has occ cough in the mornings with light yellow sputum. He uses his albuterol inhaler once per wk on average and has not had to use neb.   Dyspnea:  Improved to [facility] = can't walk a nl pace on a flat grade s sob but does fine slow and  flat better on port 02 than off  Cough: min am mucus Sleep: fine on bipap and 02 SABA use:  As above, never noct rec Wt loss    03/09/2019  f/u ov/Kurt [name] re: sob related to asthma and obesity/ did not bring all meds   Chief Complaint  Patient presents with   Follow-up    Breathing has not improved. He is using his albuterol inhaler 4 x per day and neb about 4 x per wk. He had cough this am with blood specks in it.   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  Work on inhaler technique:    Plan B = Backup Only use your albuterol inhaler  Plan C = Crisis - only use your albuterol nebulizer if you first try Plan B Walk up 30 min but make sure your have 02 over 90% - pace yourself    04/20/2019  f/u ov/Kurt [name] re: asthma/ obesity   Chief Complaint  Patient presents with   Follow-up    Breathing is about the same. He is using his albuterol inhaler 5 x per wk on average and he has not used his neb since the last visit.   Dyspnea:  MMRC2 = can't walk a nl pace on a flat grade s sob but does fine slow and flat  Cough: none  Sleeping: sleeps on back/ on cpap, two pillows SABA use: up to once a day, just the inhaler, not the neb 02: 3lpm hs on cpap  rec Work on inhaler technique:   To get the most out of exercise, you need to be continuously aware that you are short of breath, but never out of breath, for 20- 30 minutes daily.  Make sure you check your oxygen saturations at highest level of activity to be sure it stays over 90% and  adjust portable 02 flow  upward to maintain this level if needed but remember to turn it back to previous settings when you stop (to conserve your supply).    07/27/2019  f/u ov/Kurt [name] re: asthma/ mo/ osa on noct cpap/02  Chief Complaint  Patient presents with   Follow-up    Had asthma attack approx 1 wk ago- woke him up and had to take off his CPAP and had to use his albuterol inhaler and neb later that day. He has noticed some wheezing recently.   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 Cough: none Sleeping: cpap, bed flat 3 pillows  SABA use: once a day when not wearing 02/ very rarely  02: 3lpm cpap prn/ uses POC 2-3 pulsed POC  With sats low 90s when working around  Rec Work on inhaler technique:   Only use your albuterol as a rescue medication Also ok to Try albuterol 15 min before an activity that you know would make you short of breath  Make sure you check your oxygen saturations at highest level of activity to be sure it stays over 90%     Sood Recs 03/30/21 Try using breztri two puffs in the morning and two puffs in the evening, and rinse your mouth after each use  Don't use symbicort when using breztri Singulair 10 mg nightly     01/28/2022  f/u ov/Kurt [name] re: ***   maint on ***  No chief complaint on file.   Dyspnea:  *** Cough: *** Sleeping: *** SABA use: *** 02: *** Covid status:    ***  No obvious day to day or daytime variability or assoc excess/ purulent sputum or mucus plugs or hemoptysis or cp or chest tightness, subjective wheeze or overt sinus or hb symptoms.   *** without nocturnal  or early am exacerbation  of respiratory  c/o's or need for noct saba. Also denies any obvious fluctuation of symptoms with weather or environmental changes or other aggravating or alleviating factors except as outlined above   No unusual exposure hx or h/o childhood pna/ asthma or knowledge of premature birth.  Current Allergies, Complete Past Medical History, Past Surgical History, Family History, and Social History were reviewed in [employer] record.  ROS  The following are not active complaints unless bolded Hoarseness, sore throat, dysphagia, dental problems, itching, sneezing,  nasal congestion or discharge of excess mucus or purulent secretions, ear ache,   fever, chills, sweats, unintended wt loss or wt gain, classically pleuritic or exertional cp,  orthopnea pnd or arm/hand swelling  or leg swelling, presyncope, palpitations, abdominal pain, anorexia, nausea, vomiting, diarrhea  or change in bowel habits or change in bladder habits, change in stools or change in urine, dysuria, hematuria,  rash, arthralgias, visual complaints, headache, numbness, weakness or ataxia or problems with walking or coordination,  change in mood or  memory.        No outpatient medications have been marked as taking for the 01/28/22 encounter (Appointment) with Nyoka Cowden, MD.                        Objective:   Physical Exam  Wts  01/28/2022         ***  05/01/2020        274 07/27/2019         316  04/20/2019         314  03/09/2019             312  01/26/2019         309  01/13/2019           307   12/03/2017        398   09/03/17 (!) 306 lb (138.8 kg)  08/03/17 (!) 304 lb (137.9 kg)  06/22/17 (!) 305 lb 11.2 oz (138.7 kg)       Vital signs reviewed   01/28/2022  - Note at rest 02 sats  ***% on ***   General appearance:    ***     Trace sym pitting both LE's ***         Assessment:

## 2022-05-16 ENCOUNTER — Ambulatory Visit (HOSPITAL_BASED_OUTPATIENT_CLINIC_OR_DEPARTMENT_OTHER): Payer: Medicaid Other | Admitting: Pulmonary Disease

## 2022-05-24 ENCOUNTER — Ambulatory Visit: Payer: Medicaid Other | Admitting: Internal Medicine

## 2022-05-24 NOTE — Progress Notes (Deleted)
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 Luretha [name]. Pt states he has always has had SOB and bronchitis since he was a child, but has had increased SOB for the past 3-4 yrs. He states sometimes he gets winded just walking from room to room at home. He is using proair 2 x daily on and albuterol neb 1 x daily on average.   Gradually worse breathing = lifelong and better on 02 / nebs but freq just sob across the room  Doe = MMRC3 = can't walk 100 yards even at a slow pace at a flat grade s stopping due to sob  /02 2lpm  But not while on cpap as the machine is old and no adaptor for it "they said they would call but never did" rec Please see patient coordinator before you leave today  to schedule CPAP titration study> bipap per Kurt [name] Stop lisinopril and start losartan 50-12.5 and  Your breathing should gradually improve Only use your albuterol (PROAIR) as a rescue med Only use Albuterol neb if you first try the proair    12/03/2017  f/u ov/Kurt [name] re: doe with pfts c/w MO only  Chief Complaint  Patient presents with   Follow-up    Breathing is overall doing well. He has occ cough in the mornings with light yellow sputum. He uses his albuterol inhaler once per wk on average and has not had to use neb.   Dyspnea:  Improved to [facility] = can't walk a nl pace on a flat grade s sob but does fine slow and  flat better on port 02 than off  Cough: min am mucus Sleep: fine on bipap and 02 SABA use:  As above, never noct rec Wt loss    01/13/2019 acute extended ov/Kurt [name] re: worse doe  x 2 month/ indolent onset/ gradaully progressive  Chief Complaint  Patient presents with   Acute Visit    Increased SOB over the past 2 months. He states he gets SOB walking from parking lot to building. He occ has cough with tan sputum.   no prolem sitting /sleeping on cpap and 02  Main issue is doe worse in heat but also at [employer] can't do but 2 aisle then has to stop MMRC3 = can't walk 100 yards even at a slow pace at a flat grade s stopping due to sob   More cough since doe but min prod tan mucus, daytime only  Better p neb last used neb two days prior to OV   No more leg swelling than usual  Does fine on cpap and 02  Had "full labs" one day prior to ov at pcp office hasn't heard any results yet rec Pantoprazole (protonix) 40 mg   Take  30-60 min before first  meal of the day and Pepcid (famotidine)  20 mg one after supper    Plan A = Automatic = Dulera 100 Take 2 puffs first thing in am and then another 2 puffs about 12 hours later.  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   much less need for albuterol  Dyspnea:  MMRC3 = can't walk 100 yards even at a slow pace  at a flat grade s stopping due to sob/ no chest tightness  Cough: brownish mucus esp in am  Sleeping: fine on cpap and 02 3lpm  SABA use: none now  02: only at hs  rec zpak  Continue dulera 100 (symbicort 80) Take 2 puffs first thing in am and then another 2 puffs about 12 hours later.      03/09/2019  f/u ov/Kurt [name] re: sob related to asthma and obesity/ did not bring all meds   Chief Complaint  Patient presents with   Follow-up    Breathing has not improved. He is using his albuterol inhaler 4 x per day and neb about 4 x per wk. He had cough this am with blood specks in it.   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  Work on inhaler technique:    Plan B = Backup Only use your albuterol inhaler  Plan C = Crisis - only use your albuterol nebulizer if you first try Plan B Walk up 30 min but make sure your have 02 over 90% - pace yourself    04/20/2019  f/u ov/Kurt [name] re: asthma/ obesity   Chief Complaint  Patient presents with   Follow-up    Breathing is about the same. He is using his albuterol inhaler 5 x per wk on average and he has not used his neb since the last visit.   Dyspnea:  MMRC2 = can't walk a nl pace on a flat grade s sob but does fine slow and flat  Cough: none  Sleeping: sleeps on back/ on cpap, two pillows SABA use: up to once a day, just the inhaler, not the neb 02: 3lpm hs on cpap  rec Work on  inhaler technique:   To get the most out of exercise, you need to be continuously aware that you are short of  breath, but never out of breath, for 20- 30 minutes daily.  Make sure you check your oxygen saturations at highest level of activity to be sure it stays over 90% and adjust portable 02 flow  upward to maintain this level if needed but remember to turn it back to previous settings when you stop (to conserve your supply).    07/27/2019  f/u ov/Christepher Melchior re: asthma/ mo/ osa on noct cpap/02  Chief Complaint  Patient presents with   Follow-up    Had asthma attack approx 1 wk ago- woke him up and had to take off his CPAP and had to use his albuterol inhaler and neb later that day. He has noticed some wheezing recently.   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[employer] Cough: none Sleeping: cpap, bed flat 3 pillows  SABA use: once a day when not wearing 02/ very rarely  02: 3lpm cpap prn/ uses POC 2-3 pulsed POC  With sats low 90s when working around  Rec Work on inhaler technique:  Only use your albuterol as a rescue medication Also ok to Try albuterol 15 min before an activity that you know would make you short of breath  Make sure you check your oxygen saturations at highest level of activity to be sure it stays over 90%   Please schedule a follow up visit in 6  months but call sooner if needed with inhalers and your portable 02     05/24/2022  f/u ov/Dayannara Pascal re: ***   maint on ***  No chief complaint on file.    Dyspnea:  *** Cough: *** Sleeping: *** SABA use: *** 02: *** Covid status:   *** Lung cancer screening :  ***    No obvious day to day or daytime variability or assoc excess/ purulent sputum or mucus plugs or hemoptysis or cp or chest tightness, subjective wheeze or overt sinus or hb symptoms.   *** without nocturnal  or early am exacerbation  of respiratory  c/o's or need for noct saba. Also denies any obvious fluctuation of symptoms with weather or environmental changes or other aggravating or alleviating factors except as outlined above   No unusual exposure hx or h/o childhood pna/ asthma or knowledge of premature birth.  Current Allergies, Complete Past Medical History, Past Surgical History, Family History, and Social History were reviewed in [employer] record.  ROS  The following are not active complaints unless bolded Hoarseness, sore throat, dysphagia, dental problems, itching, sneezing,  nasal congestion or discharge of excess mucus or purulent secretions, ear ache,   fever, chills, sweats, unintended wt loss or wt gain, classically pleuritic or exertional cp,  orthopnea pnd  or arm/hand swelling  or leg swelling, presyncope, palpitations, abdominal pain, anorexia, nausea, vomiting, diarrhea  or change in bowel habits or change in bladder habits, change in stools or change in urine, dysuria, hematuria,  rash, arthralgias, visual complaints, headache, numbness, weakness or ataxia or problems with walking or coordination,  change in mood or  memory.        No outpatient medications have been marked as taking for the 05/24/22 encounter (Appointment) with Nyoka Cowden, MD.                           Objective:   Physical Exam  Wts  05/24/2022         ***  05/01/2020        274 07/27/2019         316  04/20/2019         314  03/09/2019             312  01/26/2019         309  01/13/2019           307   12/03/2017        398   09/03/17 (!)  306 lb (138.8 kg)  08/03/17 (!) 304 lb (137.9 kg)  06/22/17 (!) 305 lb 11.2 oz (138.7 kg)      Vital signs reviewed  05/24/2022  - Note at rest 02 sats  ***% on ***   General appearance:    ***    Assessment:

## 2022-10-01 ENCOUNTER — Other Ambulatory Visit: Payer: Self-pay | Admitting: Internal Medicine

## 2022-10-02 LAB — COMPLETE METABOLIC PANEL WITH GFR
AG Ratio: 1.2 (calc) (ref 1.0–2.5)
ALT: 21 U/L (ref 9–46)
AST: 22 U/L (ref 10–35)
Albumin: 3.9 g/dL (ref 3.6–5.1)
Alkaline phosphatase (APISO): 59 U/L (ref 35–144)
BUN: 11 mg/dL (ref 7–25)
CO2: 26 mmol/L (ref 20–32)
Calcium: 8.8 mg/dL (ref 8.6–10.3)
Chloride: 103 mmol/L (ref 98–110)
Creat: 1.15 mg/dL (ref 0.70–1.35)
Globulin: 3.2 g/dL (calc) (ref 1.9–3.7)
Glucose, Bld: 94 mg/dL (ref 65–99)
Potassium: 4.3 mmol/L (ref 3.5–5.3)
Sodium: 140 mmol/L (ref 135–146)
Total Bilirubin: 0.4 mg/dL (ref 0.2–1.2)
Total Protein: 7.1 g/dL (ref 6.1–8.1)
eGFR: 73 mL/min/{1.73_m2} (ref >=60)

## 2022-10-02 LAB — LIPID PANEL
Cholesterol: 134 mg/dL (ref <200)
HDL: 49 mg/dL (ref >=40)
LDL Cholesterol (Calc): 67 mg/dL (calc)
Non-HDL Cholesterol (Calc): 85 mg/dL (calc) (ref <130)
Total CHOL/HDL Ratio: 2.7 (calc) (ref <5.0)
Triglycerides: 95 mg/dL (ref <150)

## 2022-10-02 LAB — CBC
HCT: 51.6 % — ABNORMAL HIGH (ref 38.5–50.0)
Hemoglobin: 17 g/dL (ref 13.2–17.1)
MCH: 29.6 pg (ref 27.0–33.0)
MCHC: 32.9 g/dL (ref 32.0–36.0)
MCV: 89.9 fL (ref 80.0–100.0)
MPV: 10.7 fL (ref 7.5–12.5)
Platelets: 201 10*3/uL (ref 140–400)
RBC: 5.74 10*6/uL (ref 4.20–5.80)
RDW: 14.3 % (ref 11.0–15.0)
WBC: 8 10*3/uL (ref 3.8–10.8)

## 2022-10-02 LAB — VITAMIN D 25 HYDROXY (VIT D DEFICIENCY, FRACTURES): Vit D, 25-Hydroxy: 41 ng/mL (ref 30–100)

## 2022-10-02 LAB — TSH: TSH: 1.29 mIU/L (ref 0.40–4.50)

## 2022-10-02 LAB — URIC ACID: Uric Acid, Serum: 6.9 mg/dL (ref 4.0–8.0)

## 2022-10-02 LAB — PSA: PSA: 0.67 ng/mL (ref <=4.00)

## 2022-11-22 ENCOUNTER — Ambulatory Visit (HOSPITAL_COMMUNITY): Admission: RE | Admit: 2022-11-22 | Payer: Medicaid Other | Source: Ambulatory Visit

## 2022-11-22 ENCOUNTER — Other Ambulatory Visit (HOSPITAL_COMMUNITY): Payer: Self-pay | Admitting: Internal Medicine

## 2022-11-22 DIAGNOSIS — R609 Edema, unspecified: Secondary | ICD-10-CM

## 2022-11-28 ENCOUNTER — Ambulatory Visit (HOSPITAL_COMMUNITY): Admission: RE | Admit: 2022-11-28 | Payer: Medicaid Other | Source: Ambulatory Visit

## 2022-11-28 ENCOUNTER — Encounter (HOSPITAL_COMMUNITY): Payer: Self-pay

## 2022-12-05 ENCOUNTER — Ambulatory Visit (HOSPITAL_COMMUNITY): Payer: Medicaid Other

## 2022-12-13 ENCOUNTER — Ambulatory Visit (HOSPITAL_COMMUNITY): Payer: Medicaid Other

## 2022-12-18 ENCOUNTER — Ambulatory Visit (HOSPITAL_COMMUNITY): Payer: Medicaid Other

## 2022-12-23 ENCOUNTER — Emergency Department (HOSPITAL_COMMUNITY): Payer: Medicaid Other

## 2022-12-23 ENCOUNTER — Inpatient Hospital Stay (HOSPITAL_COMMUNITY)
Admission: EM | Admit: 2022-12-23 | Discharge: 2023-01-01 | DRG: 175 | Disposition: A | Discharge status: Home / Self Care | Payer: Medicaid Other | Attending: Internal Medicine | Admitting: Internal Medicine

## 2022-12-23 ENCOUNTER — Other Ambulatory Visit: Payer: Self-pay

## 2022-12-23 ENCOUNTER — Encounter (HOSPITAL_COMMUNITY): Payer: Self-pay

## 2022-12-23 DIAGNOSIS — F32A Depression, unspecified: Secondary | ICD-10-CM | POA: Diagnosis present

## 2022-12-23 DIAGNOSIS — E662 Morbid (severe) obesity with alveolar hypoventilation: Secondary | ICD-10-CM | POA: Diagnosis present

## 2022-12-23 DIAGNOSIS — R52 Pain, unspecified: Secondary | ICD-10-CM | POA: Diagnosis not present

## 2022-12-23 DIAGNOSIS — Z6841 Body Mass Index (BMI) 40.0 and over, adult: Secondary | ICD-10-CM | POA: Diagnosis not present

## 2022-12-23 DIAGNOSIS — I2609 Other pulmonary embolism with acute cor pulmonale: Principal | ICD-10-CM | POA: Diagnosis present

## 2022-12-23 DIAGNOSIS — I700 Atherosclerosis of aorta: Secondary | ICD-10-CM | POA: Diagnosis present

## 2022-12-23 DIAGNOSIS — I878 Other specified disorders of veins: Secondary | ICD-10-CM | POA: Diagnosis present

## 2022-12-23 DIAGNOSIS — I82409 Acute embolism and thrombosis of unspecified deep veins of unspecified lower extremity: Secondary | ICD-10-CM | POA: Insufficient documentation

## 2022-12-23 DIAGNOSIS — I82462 Acute embolism and thrombosis of left calf muscular vein: Secondary | ICD-10-CM | POA: Diagnosis present

## 2022-12-23 DIAGNOSIS — I272 Pulmonary hypertension, unspecified: Secondary | ICD-10-CM | POA: Diagnosis present

## 2022-12-23 DIAGNOSIS — K219 Gastro-esophageal reflux disease without esophagitis: Secondary | ICD-10-CM | POA: Diagnosis present

## 2022-12-23 DIAGNOSIS — Z7982 Long term (current) use of aspirin: Secondary | ICD-10-CM

## 2022-12-23 DIAGNOSIS — Z7901 Long term (current) use of anticoagulants: Secondary | ICD-10-CM

## 2022-12-23 DIAGNOSIS — Z880 Allergy status to penicillin: Secondary | ICD-10-CM | POA: Diagnosis not present

## 2022-12-23 DIAGNOSIS — I5021 Acute systolic (congestive) heart failure: Secondary | ICD-10-CM | POA: Diagnosis not present

## 2022-12-23 DIAGNOSIS — Z9981 Dependence on supplemental oxygen: Secondary | ICD-10-CM | POA: Diagnosis not present

## 2022-12-23 DIAGNOSIS — Z7951 Long term (current) use of inhaled steroids: Secondary | ICD-10-CM

## 2022-12-23 DIAGNOSIS — I110 Hypertensive heart disease with heart failure: Secondary | ICD-10-CM | POA: Diagnosis present

## 2022-12-23 DIAGNOSIS — J9601 Acute respiratory failure with hypoxia: Secondary | ICD-10-CM | POA: Diagnosis not present

## 2022-12-23 DIAGNOSIS — J4489 Other specified chronic obstructive pulmonary disease: Secondary | ICD-10-CM | POA: Diagnosis present

## 2022-12-23 DIAGNOSIS — Z87891 Personal history of nicotine dependence: Secondary | ICD-10-CM | POA: Diagnosis not present

## 2022-12-23 DIAGNOSIS — J9621 Acute and chronic respiratory failure with hypoxia: Secondary | ICD-10-CM | POA: Diagnosis present

## 2022-12-23 DIAGNOSIS — Z8249 Family history of ischemic heart disease and other diseases of the circulatory system: Secondary | ICD-10-CM

## 2022-12-23 DIAGNOSIS — I5032 Chronic diastolic (congestive) heart failure: Secondary | ICD-10-CM | POA: Diagnosis present

## 2022-12-23 DIAGNOSIS — I82442 Acute embolism and thrombosis of left tibial vein: Secondary | ICD-10-CM | POA: Diagnosis present

## 2022-12-23 DIAGNOSIS — R7989 Other specified abnormal findings of blood chemistry: Secondary | ICD-10-CM

## 2022-12-23 DIAGNOSIS — Z79899 Other long term (current) drug therapy: Secondary | ICD-10-CM

## 2022-12-23 DIAGNOSIS — M109 Gout, unspecified: Secondary | ICD-10-CM | POA: Diagnosis present

## 2022-12-23 DIAGNOSIS — I82432 Acute embolism and thrombosis of left popliteal vein: Secondary | ICD-10-CM | POA: Diagnosis present

## 2022-12-23 DIAGNOSIS — I2510 Atherosclerotic heart disease of native coronary artery without angina pectoris: Secondary | ICD-10-CM | POA: Diagnosis present

## 2022-12-23 DIAGNOSIS — I2781 Cor pulmonale (chronic): Secondary | ICD-10-CM | POA: Insufficient documentation

## 2022-12-23 DIAGNOSIS — J9622 Acute and chronic respiratory failure with hypercapnia: Secondary | ICD-10-CM | POA: Diagnosis present

## 2022-12-23 DIAGNOSIS — Z91148 Patient's other noncompliance with medication regimen for other reason: Secondary | ICD-10-CM

## 2022-12-23 DIAGNOSIS — I2489 Other forms of acute ischemic heart disease: Secondary | ICD-10-CM | POA: Diagnosis present

## 2022-12-23 DIAGNOSIS — G4733 Obstructive sleep apnea (adult) (pediatric): Secondary | ICD-10-CM

## 2022-12-23 DIAGNOSIS — M7989 Other specified soft tissue disorders: Secondary | ICD-10-CM | POA: Diagnosis not present

## 2022-12-23 DIAGNOSIS — I824Y2 Acute embolism and thrombosis of unspecified deep veins of left proximal lower extremity: Secondary | ICD-10-CM | POA: Diagnosis not present

## 2022-12-23 DIAGNOSIS — E8729 Other acidosis: Secondary | ICD-10-CM | POA: Diagnosis present

## 2022-12-23 DIAGNOSIS — I2699 Other pulmonary embolism without acute cor pulmonale: Secondary | ICD-10-CM

## 2022-12-23 DIAGNOSIS — I10 Essential (primary) hypertension: Secondary | ICD-10-CM | POA: Diagnosis present

## 2022-12-23 DIAGNOSIS — E871 Hypo-osmolality and hyponatremia: Secondary | ICD-10-CM | POA: Diagnosis present

## 2022-12-23 LAB — CBC
HCT: 51.6 % (ref 39.0–52.0)
Hemoglobin: 15.4 g/dL (ref 13.0–17.0)
MCH: 26.6 pg (ref 26.0–34.0)
MCHC: 29.8 g/dL — ABNORMAL LOW (ref 30.0–36.0)
MCV: 89.1 fL (ref 80.0–100.0)
Platelets: 175 10*3/uL (ref 150–400)
RBC: 5.79 MIL/uL (ref 4.22–5.81)
RDW: 15.9 % — ABNORMAL HIGH (ref 11.5–15.5)
WBC: 10.7 10*3/uL — ABNORMAL HIGH (ref 4.0–10.5)
nRBC: 0.8 % — ABNORMAL HIGH (ref 0.0–0.2)

## 2022-12-23 LAB — BASIC METABOLIC PANEL
Anion gap: 8 (ref 5–15)
BUN: 25 mg/dL — ABNORMAL HIGH (ref 6–20)
CO2: 32 mmol/L (ref 22–32)
Calcium: 8 mg/dL — ABNORMAL LOW (ref 8.9–10.3)
Chloride: 92 mmol/L — ABNORMAL LOW (ref 98–111)
Creatinine, Ser: 1.25 mg/dL — ABNORMAL HIGH (ref 0.61–1.24)
GFR, Estimated: >60 mL/min (ref >60)
Glucose, Bld: 112 mg/dL — ABNORMAL HIGH (ref 70–99)
Potassium: 4.1 mmol/L (ref 3.5–5.1)
Sodium: 132 mmol/L — ABNORMAL LOW (ref 135–145)

## 2022-12-23 LAB — LACTIC ACID, PLASMA
Lactic Acid, Venous: 1.6 mmol/L (ref 0.5–1.9)
Lactic Acid, Venous: 1.2 mmol/L (ref 0.5–1.9)

## 2022-12-23 LAB — BRAIN NATRIURETIC PEPTIDE: B Natriuretic Peptide: 417.8 pg/mL — ABNORMAL HIGH (ref 0.0–100.0)

## 2022-12-23 LAB — TROPONIN I (HIGH SENSITIVITY)
Troponin I (High Sensitivity): 43 ng/L — ABNORMAL HIGH (ref <18)
Troponin I (High Sensitivity): 42 ng/L — ABNORMAL HIGH (ref <18)

## 2022-12-23 MED ORDER — METHYLPREDNISOLONE SODIUM SUCC 125 MG IJ SOLR
125.0000 mg | Freq: Once | INTRAMUSCULAR | Status: AC
  Administered 2022-12-23: 125 mg via INTRAVENOUS
  Filled 2022-12-23: qty 2

## 2022-12-23 MED ORDER — HEPARIN (PORCINE) 25000 UT/250ML-% IV SOLN
1650.0000 [IU]/h | INTRAVENOUS | Status: DC
  Administered 2022-12-23 – 2022-12-25 (×4): 1650 [IU]/h via INTRAVENOUS
  Filled 2022-12-23 (×4): qty 250

## 2022-12-23 MED ORDER — AZITHROMYCIN 250 MG PO TABS: 500.0000 mg | ORAL_TABLET | Freq: Every day | ORAL | Status: DC

## 2022-12-23 MED ORDER — IOHEXOL 350 MG/ML SOLN
75.0000 mL | Freq: Once | INTRAVENOUS | Status: AC | PRN
  Administered 2022-12-23: 80 mL via INTRAVENOUS

## 2022-12-23 MED ORDER — IPRATROPIUM-ALBUTEROL 0.5-2.5 (3) MG/3ML IN SOLN: 3.0000 mL | Freq: Four times a day (QID) | RESPIRATORY_TRACT | Status: DC

## 2022-12-23 MED ORDER — HEPARIN BOLUS VIA INFUSION
3000.0000 [IU] | Freq: Once | INTRAVENOUS | Status: AC
  Administered 2022-12-23: 3000 [IU] via INTRAVENOUS
  Filled 2022-12-23: qty 3000

## 2022-12-23 MED ORDER — BUDESONIDE 0.25 MG/2ML IN SUSP
0.2500 mg | Freq: Two times a day (BID) | RESPIRATORY_TRACT | Status: DC
  Administered 2022-12-23 – 2022-12-25 (×4): 0.25 mg via RESPIRATORY_TRACT
  Filled 2022-12-23 (×4): qty 2

## 2022-12-23 MED ORDER — ARFORMOTEROL TARTRATE 15 MCG/2ML IN NEBU
15.0000 ug | INHALATION_SOLUTION | Freq: Two times a day (BID) | RESPIRATORY_TRACT | Status: DC
  Administered 2022-12-23 – 2022-12-31 (×16): 15 ug via RESPIRATORY_TRACT
  Filled 2022-12-23 (×18): qty 2

## 2022-12-23 MED ORDER — IPRATROPIUM-ALBUTEROL 0.5-2.5 (3) MG/3ML IN SOLN
3.0000 mL | Freq: Once | RESPIRATORY_TRACT | Status: AC
  Administered 2022-12-23: 3 mL via RESPIRATORY_TRACT
  Filled 2022-12-23: qty 3

## 2022-12-23 MED ORDER — PANTOPRAZOLE SODIUM 40 MG PO TBEC
40.0000 mg | DELAYED_RELEASE_TABLET | Freq: Every day | ORAL | Status: DC
  Administered 2022-12-24 – 2023-01-01 (×8): 40 mg via ORAL
  Filled 2022-12-23 (×8): qty 1

## 2022-12-23 MED ORDER — FUROSEMIDE 10 MG/ML IJ SOLN: 40.0000 mg | Freq: Once | INTRAMUSCULAR | Status: DC

## 2022-12-23 NOTE — Progress Notes (Signed)
LLE venous duplex has been completed.  Preliminary findings given to [facility], PA-C.   Results can be found under chart review under CV PROC. 12/23/2022 7:34 PM Esteen Delpriore RVT, RDMS

## 2022-12-23 NOTE — ED Triage Notes (Addendum)
For apprx 2 wks pt has not been able to use oxygen, nebulizer, or c-pap machine due to moving. Pt oxygen saturation at 78% on RA on arrival. Pt normally wears 3L via [place], and saturation in high 80's or low 90's. Pt did endorse some chest pain earlier but states it has resolved since arriving to the ER. Pt also supposed to have DVT ultrasound performed due to leg pain/swelling but had to reschedule due to being sick.

## 2022-12-23 NOTE — ED Provider Notes (Signed)
[place] EMERGENCY DEPARTMENT AT [facility] Provider Note   CSN: [id number] Arrival date & time: 12/23/22  1737     History  Chief Complaint  Patient presents with   Shortness of Breath        Chest Pain    Kurt [name] is a 62 y.o. male chronic hypoxic respiratory failure on 3 L via nasal cannula, chronic bronchitis, GERD, obesity hypoventilation syndrome here for evaluation of shortness of breath.  Patient states he is living in between feeling members until he gets better housing.  He did not have access to his oxygen, nebulizer treatments or BiPAP for his sleep apnea.  He noted he has had worsening shortness of breath, cough.  Earlier today he had chest pain.  Did not radiate to his back, left arm or jaw.  No diaphoresis, nausea or vomiting.  Does not typically get chest pain.  He has no current pain.  He does have some shortness of breath.  States he is supposed to have ultrasound of his left lower extremity to rule out DVT a few weeks ago however due to lack of transportation he was unable to have this performed.  He still has pain and swelling to his left lower extremity left greater than right.  Has had some chills without documented fever.  No congestion or rhinorrhea.  No abdominal pain.  FULL CODE  HPI     Home Medications Prior to Admission medications   Medication Sig Start Date End Date Taking? Authorizing Provider  albuterol (PROVENTIL) (2.5 MG/3ML) 0.083% nebulizer solution Take 3 mLs (2.5 mg total) by nebulization every 6 (six) hours as needed for wheezing or shortness of breath. 03/30/21  Yes Coralyn Helling, MD  aspirin EC 325 MG tablet Take 325 mg by mouth daily.   Yes [provider]  cetirizine (ZYRTEC) 10 MG tablet Take 10 mg by mouth as needed. 12/30/17  Yes [provider]  famotidine (PEPCID) 20 MG tablet One after supper 01/13/19  Yes Nyoka Cowden, MD  fluticasone [facility]) 50 MCG/ACT nasal spray Place 1 spray into both nostrils  daily for 14 days. 12/23/17 12/23/22 Yes Lucia Estelle, NP  furosemide (LASIX) 40 MG tablet Take 40 mg by mouth daily.   Yes [provider]  ibuprofen (ADVIL,MOTRIN) 800 MG tablet Take 800 mg by mouth 3 (three) times daily. 02/27/18  Yes [provider]  losartan-hydrochlorothiazide (HYZAAR) 50-12.5 MG tablet TAKE 1 TABLET BY MOUTH EVERY DAY 04/12/19  Yes Nyoka Cowden, MD  montelukast (SINGULAIR) 10 MG tablet Take 1 tablet (10 mg total) by mouth at bedtime. 03/30/21  Yes Coralyn Helling, MD  potassium chloride (K-DUR) 10 MEQ tablet Take 10 mEq by mouth daily. 06/11/17  Yes [provider]  tadalafil (CIALIS) 20 MG tablet Take 20 mg by mouth daily as needed for erectile dysfunction. 12/09/22  Yes [provider]  tiZANidine (ZANAFLEX) 4 MG tablet Take 4 mg by mouth every 6 (six) hours as needed for muscle spasms.   Yes [provider]  Vitamin D, Ergocalciferol, (DRISDOL) 50000 units CAPS capsule Take 50,000 Units by mouth once a week. 06/18/17  Yes [provider]  Budeson-Glycopyrrol-Formoterol (BREZTRI AEROSPHERE) 160-9-4.8 MCG/ACT AERO Inhale 2 puffs into the lungs in the morning and at bedtime. 03/30/21   Coralyn Helling, MD  Chlorphen-PE-Acetaminophen (NOREL AD) 4-10-325 MG TABS Take by mouth 3 times/day as needed-between meals & bedtime.    [provider]  MITIGARE 0.6 MG CAPS Take 1 capsule  by mouth 2 (two) times daily as needed. 03/10/19   [provider]  pantoprazole (PROTONIX) 40 MG tablet Take 40 mg by mouth daily before breakfast.    [provider]  UNABLE TO FIND Med Name: CPAP with 3lpm o2  Adapt    [provider]      Allergies    Penicillins    Review of Systems   Review of Systems  Constitutional:  Positive for activity change, appetite change and chills.  HENT: Negative.    Respiratory:  Positive for cough and shortness of breath. Negative for apnea, choking, chest tightness, wheezing and stridor.    Cardiovascular:  Positive for chest pain and leg swelling. Negative for palpitations.  Gastrointestinal: Negative.   Genitourinary: Negative.   Musculoskeletal: Negative.   Skin: Negative.   Neurological: Negative.   All other systems reviewed and are negative.   Physical Exam Updated Vital Signs BP (!) 142/85   Pulse 92   Temp 98.1 F (36.7 C) (Oral)   Resp (!) 30   Ht 5' 6.5" (1.689 m)   Wt 118.8 kg   SpO2 (!) 89%   BMI 41.65 kg/m  Physical Exam Vitals and nursing note reviewed.  Constitutional:      General: He is not in acute distress.    Appearance: He is well-developed. He is obese. He is ill-appearing (chronicaly ill appearing). He is not toxic-appearing or diaphoretic.  HENT:     Head: Normocephalic and atraumatic.     Mouth/Throat:     Mouth: Mucous membranes are moist.  Eyes:     Pupils: Pupils are equal, round, and reactive to light.  Cardiovascular:     Rate and Rhythm: Regular rhythm. Tachycardia present.     Pulses: Normal pulses.     Heart sounds: Normal heart sounds.  Pulmonary:     Effort: Pulmonary effort is normal. Tachypnea present. No respiratory distress.     Breath sounds: Decreased breath sounds present.     Comments: Tachypneic, speaks in full sentences without difficulty, does have some decreased breath sounds bilaterally however difficult exam due to body habitus. Abdominal:     General: Bowel sounds are normal. There is no distension.     Palpations: Abdomen is soft.  Musculoskeletal:        General: Normal range of motion.     Cervical back: Normal range of motion and neck supple.     Right lower leg: No tenderness. Edema present.     Left lower leg: Tenderness present. Edema present.     Comments: Edema bilateral lower extremities left greater than right.  Chronic venous stasis skin changes bilaterally, some erythema and tenderness to his left lower extremity  Skin:    General: Skin is warm and dry.     Capillary Refill: Capillary  refill takes less than 2 seconds.     Comments: Chronic venous stasis skin changes bilateral lower extremities, erythema and warmth to left lower extremity, no crepitus  Neurological:     General: No focal deficit present.     Mental Status: He is alert and oriented to person, place, and time.    ED Results / Procedures / Treatments   Labs (all labs ordered are listed, but only abnormal results are displayed) Labs Reviewed  BASIC METABOLIC PANEL - Abnormal; Notable for the following components:      Result Value   Sodium 132 (*)    Chloride 92 (*)    Glucose, Bld 112 (*)  BUN 25 (*)    Creatinine, Ser 1.25 (*)    Calcium 8.0 (*)    All other components within normal limits  CBC - Abnormal; Notable for the following components:   WBC 10.7 (*)    MCHC 29.8 (*)    RDW 15.9 (*)    nRBC 0.8 (*)    All other components within normal limits  BRAIN NATRIURETIC PEPTIDE - Abnormal; Notable for the following components:   B Natriuretic Peptide 417.8 (*)    All other components within normal limits  TROPONIN I (HIGH SENSITIVITY) - Abnormal; Notable for the following components:   Troponin I (High Sensitivity) 43 (*)    All other components within normal limits  CULTURE, BLOOD (ROUTINE X 2)  CULTURE, BLOOD (ROUTINE X 2)  LACTIC ACID, PLASMA  LACTIC ACID, PLASMA  CBC  HEPARIN LEVEL (UNFRACTIONATED)  TROPONIN I (HIGH SENSITIVITY)    EKG EKG Interpretation  Date/Time:  Monday December 23 2022 17:50:23 EDT Ventricular Rate:  104 PR Interval:  160 QRS Duration: 93 QT Interval:  326 QTC Calculation: 429 R Axis:   250 Text Interpretation: Sinus tachycardia Left anterior fascicular block Abnormal R-wave progression, late transition Borderline repolarization abnormality Confirmed by Vonita Moss [phone]) on 12/23/2022 6:57:59 PM  Radiology CT Angio Chest PE W and/or Wo Contrast  Result Date: 12/23/2022 CLINICAL DATA:  Shortness of breath EXAM: CT ANGIOGRAPHY CHEST WITH CONTRAST  TECHNIQUE: Multidetector CT imaging of the chest was performed using the standard protocol during bolus administration of intravenous contrast. Multiplanar CT image reconstructions and MIPs were obtained to evaluate the vascular anatomy. RADIATION DOSE REDUCTION: This exam was performed according to the departmental dose-optimization program which includes automated exposure control, adjustment of the mA and/or kV according to patient size and/or use of iterative reconstruction technique. CONTRAST:  80mL OMNIPAQUE IOHEXOL 350 MG/ML SOLN COMPARISON:  None Available. FINDINGS: Cardiovascular: Extensive pulmonary embolus involving the right pulmonary artery and distal left/interlobar pulmonary artery, and numerous bilateral proximal segmental/subsegmental branches. Overall heart size is normal. Markedly elevated RV to LV ratio of 2.3. No pericardial effusion. Normal caliber thoracic aorta with mild atherosclerotic disease. Mild coronary artery calcifications. Mediastinum/Nodes: Esophagus and thyroid are unremarkable. No pathologically enlarged lymph nodes seen in the chest. Lungs/Pleura: Central airways are patent. Marked elevation of the right hemidiaphragm with adjacent atelectasis. Mild lingular atelectasis also seen. No pleural effusion or pneumothorax. Upper Abdomen: No acute abnormality. Musculoskeletal: No chest wall abnormality. No acute or significant osseous findings. Review of the MIP images confirms the above findings. IMPRESSION: 1. Extensive pulmonary embolus involving the right pulmonary artery and distal left/interlobar pulmonary artery, and numerous bilateral proximal segmental/subsegmental branches. 2. CT evidence of right heart strain (RV to LV ratio of 2.3). 3. Elevation of the right hemidiaphragm with adjacent atelectasis. 4. Coronary artery calcifications and aortic atherosclerosis (ICD10-I70.0). Critical Value/emergent results were called by telephone at the time of interpretation on 12/23/2022 at  9:02 pm to provider [facility] , who verbally acknowledged these results. Electronically Signed   By: Allegra Lai M.D.   On: 12/23/2022 21:04   VAS [place] LOWER EXTREMITY VENOUS (DVT) (ONLY [facility] & [facility])  Result Date: 12/23/2022  Lower Venous DVT Study Patient Name:  JARQUISE HIGGS  Date of Exam:   12/23/2022 Medical Rec #: [id number]      Accession #:    [id number] Date of Birth: 1960/08/23      Patient Gender: M Patient Age:   80 years Exam Location:  [facility] Procedure:  VAS [place] LOWER EXTREMITY VENOUS (DVT) Referring Phys: Christelle Igoe --------------------------------------------------------------------------------  Indications: Pain and swelling x 1 month.  Limitations: Body habitus and poor ultrasound/tissue interface. Comparison Study: No previous exams Performing Technologist: Jody Hill RVT, RDMS  Examination Guidelines: A complete evaluation includes B-mode imaging, spectral Doppler, color Doppler, and power Doppler as needed of all accessible portions of each vessel. Bilateral testing is considered an integral part of a complete examination. Limited examinations for reoccurring indications may be performed as noted. The reflux portion of the exam is performed with the patient in reverse Trendelenburg.  +-----+---------------+---------+-----------+----------+--------------+ RIGHTCompressibilityPhasicitySpontaneityPropertiesThrombus Aging +-----+---------------+---------+-----------+----------+--------------+ CFV  Full           Yes      Yes                                 +-----+---------------+---------+-----------+----------+--------------+   +---------+---------------+---------+-----------+----------+-------------------+ LEFT     CompressibilityPhasicitySpontaneityPropertiesThrombus Aging      +---------+---------------+---------+-----------+----------+-------------------+ CFV      Full           No       Yes        pulsatile                      +---------+---------------+---------+-----------+----------+-------------------+ SFJ      Full                                                             +---------+---------------+---------+-----------+----------+-------------------+ FV Prox  Full           Yes      Yes                                      +---------+---------------+---------+-----------+----------+-------------------+ FV Mid   Full           Yes      Yes                                      +---------+---------------+---------+-----------+----------+-------------------+ FV DistalFull           Yes      Yes                  Not well visualized +---------+---------------+---------+-----------+----------+-------------------+ PFV      Full                                                             +---------+---------------+---------+-----------+----------+-------------------+ POP      Partial        Yes      Yes                  Acute               +---------+---------------+---------+-----------+----------+-------------------+ PTV      None           No  No                   Acute               +---------+---------------+---------+-----------+----------+-------------------+ PERO                                                  Not visualized      +---------+---------------+---------+-----------+----------+-------------------+ Gastroc  None           No       No                   Acute               +---------+---------------+---------+-----------+----------+-------------------+  Left Technical Findings: Not visualized segments include peroneal veins.   Summary: RIGHT: - No evidence of common femoral vein obstruction.  LEFT: - Findings consistent with acute deep vein thrombosis involving the left popliteal vein, left posterior tibial veins, and left gastrocnemius veins. - No cystic structure found in the popliteal fossa.  *See table(s) above for measurements and observations.     Preliminary    DG Chest Port 1 View  Result Date: 12/23/2022 CLINICAL DATA:  COPD. EXAM: PORTABLE CHEST 1 VIEW COMPARISON:  Chest radiograph dated 01/13/2019. FINDINGS: Shallow inspiration with bibasilar linear and platelike atelectasis or scarring. No focal consolidation, pleural effusion, or pneumothorax. Top-normal cardiac silhouette. No acute osseous pathology. IMPRESSION: No acute cardiopulmonary process. Electronically Signed   By: Elgie Collard M.D.   On: 12/23/2022 19:39    Procedures .Critical Care  Performed by: Linwood Dibbles, PA-C Authorized by: Linwood Dibbles, PA-C   Critical care provider statement:    Critical care time (minutes):  35   Critical care was necessary to treat or prevent imminent or life-threatening deterioration of the following conditions:  Cardiac failure and circulatory failure   Critical care was time spent personally by me on the following activities:  Development of treatment plan with patient or surrogate, discussions with consultants, evaluation of patient's response to treatment, examination of patient, ordering and review of laboratory studies, ordering and review of radiographic studies, ordering and performing treatments and interventions, pulse oximetry, re-evaluation of patient's condition and review of old charts     Medications Ordered in ED Medications  heparin ADULT infusion 100 units/mL (25000 units/275mL) (1,650 Units/hr Intravenous New Bag/Given 12/23/22 2011)  methylPREDNISolone sodium succinate (SOLU-MEDROL) 125 mg/2 mL injection 125 mg (125 mg Intravenous Given 12/23/22 1847)  ipratropium-albuterol (DUONEB) 0.5-2.5 (3) MG/3ML nebulizer solution 3 mL (3 mLs Nebulization Given 12/23/22 1836)  heparin bolus via infusion 3,000 Units (3,000 Units Intravenous Bolus from Bag 12/23/22 2012)  iohexol (OMNIPAQUE) 350 MG/ML injection 75 mL (80 mLs Intravenous Contrast Given 12/23/22 2026)   ED Course/ Medical Decision Making/ A&P Clinical  Course as of 12/23/22 2145  Mon Dec 23, 2022  1941 Hypoxic, 82% on 5 L via nasal cannula.  Placed on high flow, 10 L [BH]  1941 Given positive DVT study, discussed with Marcelino Duster, pharmacist.  Heparin for DVT presumed PE.  Will place order to heparinize [BH]  1948 VAS [place] LOWER EXTREMITY VENOUS (DVT) (ONLY [facility] & [facility]) Positive for DVT [BH]    Clinical Course User Index [BH] Kurt [name] A, PA-C   62 year old chronically ill male with chronic hypoxic respiratory failure here for evaluation of shortness  of breath as well as chest pain.  He has not been able to use his home oxygen, nebulizer or BiPAP machine due to moving and not having access to his home equipment.  Earlier today had some chest pain, nonexertional, nonpleuritic.  Lasted a few minutes and self resolved.  He does not typically get chest pain with ADLs.  He has noted some consistent shortness of breath typically uses nebulizers daily.  He has asthma/chronic bronchitis, obesity hypoventilation syndrome for last pulmonary note.  He has had chills without documented fever.  On arrival patient tachycardic, tachypneic, hypoxic to 78% on room air, on 3 L his home oxygen he still persistently in the 80s.  Does have some edema, erythema to left lower extremity which is greater than right.  He has some mild decreased lung sounds will plan on labs, imaging, reassess  Labs and imaging personally viewed and interpreted:  Ultrasound for DVT positive Chest x-ray without pulmonary edema, pneumothorax, infiltrates CBC without leukocytosis BMP sodium 132, creatinine 1.25 Lactic 1.6 BNP 417 Trop 43 EKG without ischemic changes CTA positive for extensive PE    Reassessed.  Desatted to 79% on 4 L.  He speaks in full sentences.  Patient placed on high flow O2 at 10 L with oxygen increased to 90%.  Ultrasound vascular tech at bedside states he is positive for DVT.  Given his hypoxia will treat for DVT/PE given high suspicion for this.  He is pending  labs.  CTA with PE, still on 10 L HFNC. Appears comfortable on RA. Will consult with PCCM for admission given O2 requirement.  CONSULT with Dr. Celine Mans pulmonary critical care.  She recommends echocardiogram.  She is comfortable with patient going to the hospitalist team and if need be they can consult in the morning.  CONSULT with hospitalist Dr. Avie Arenas who is agreeable to accept patient for admission  The patient appears reasonably stabilized for admission considering the current resources, flow, and capabilities available in the ED at this time, and I doubt any other [facility] requiring further screening and/or treatment in the ED prior to admission.                               Medical Decision Making Amount and/or Complexity of Data Reviewed Independent Historian:     Details: Family at bedside External Data Reviewed: labs, radiology, ECG and notes. Labs: ordered. Decision-making details documented in ED Course. Radiology: ordered and independent interpretation performed. Decision-making details documented in ED Course. ECG/medicine tests: ordered and independent interpretation performed. Decision-making details documented in ED Course.  Risk OTC drugs. Prescription drug management. Parenteral controlled substances. Decision regarding hospitalization. Diagnosis or treatment significantly limited by social determinants of health.           Final Clinical Impression(s) / ED Diagnoses Final diagnoses:  Acute on chronic hypoxic respiratory failure (HCC)  Acute deep vein thrombosis (DVT) of proximal vein of left lower extremity (HCC)  Other acute pulmonary embolism with acute cor pulmonale (HCC)  Elevated troponin  Elevated brain natriuretic peptide (BNP) level  Hyponatremia    Rx / DC Orders ED Discharge Orders     None         Emmert Roethler A, PA-C 12/23/22 2145    Rondel Baton, MD 12/25/22 1047

## 2022-12-23 NOTE — ED Notes (Signed)
Doppler at bedside.

## 2022-12-23 NOTE — ED Notes (Signed)
ED TO INPATIENT HANDOFF REPORT  ED Nurse Name and Phone #: Ines Bloomer [phone]  S Name/Age/Gender Kurt [name] 62 y.o. male Room/Bed: RESA/RESA  Code Status   Code Status: Full Code  Home/SNF/Other Home Patient oriented to: self, place, time, and situation Is this baseline? Yes   Triage Complete: Triage complete  Chief Complaint Acute hypoxic respiratory failure (HCC) [J96.01]  Triage Note For apprx 2 wks pt has not been able to use oxygen, nebulizer, or c-pap machine due to moving. Pt oxygen saturation at 78% on RA on arrival. Pt normally wears 3L via [place], and saturation in high 80's or low 90's. Pt did endorse some chest pain earlier but states it has resolved since arriving to the ER. Pt also supposed to have DVT ultrasound performed due to leg pain/swelling but had to reschedule due to being sick.    Allergies Allergies  Allergen Reactions   Penicillins Itching    Has patient had a PCN reaction causing immediate rash, facial/tongue/throat swelling, SOB or lightheadedness with hypotension: No Has patient had a PCN reaction causing severe rash involving mucus membranes or skin necrosis: No Has patient had a PCN reaction that required hospitalization: No Has patient had a PCN reaction occurring within the last 10 years: No If all of the above answers are "NO", then may proceed with Cephalosporin use.    Level of Care/Admitting Diagnosis ED Disposition     ED Disposition  Admit   Condition  --   Comment  Hospital Area: [facility] [[id number]]  Level of Care: Stepdown [14]  Admit to SDU based on following criteria: Respiratory Distress:  Frequent assessment and/or intervention to maintain adequate ventilation/respiration, pulmonary toilet, and respiratory treatment.  May admit patient to [facility] or [facility] if equivalent level of care is available:: Yes  Covid Evaluation: Asymptomatic - no recent exposure (last 10 days) testing not required   Diagnosis: Acute hypoxic respiratory failure [facility]) [[id number]]  Admitting Physician: Alan Mulder [[id number]]  Attending Physician: Alan Mulder [phone]  Certification:: I certify this patient will need inpatient services for at least 2 midnights  Estimated Length of Stay: 3          B Medical/Surgery History Past Medical History:  Diagnosis Date   Anxiety    Arthritis    "hands, knees, joints, lower back" (06/20/2017)   Borderline diabetes    Chronic bronchitis (HCC)    COPD (chronic obstructive pulmonary disease) (HCC)    dx'd 06/20/2017   Depression    GERD (gastroesophageal reflux disease)    Gout    Hypertension    OSA on CPAP    Past Surgical History:  Procedure Laterality Date   NO PAST SURGERIES       A IV Location/Drains/Wounds Patient Lines/Drains/Airways Status     Active Line/Drains/Airways     Name Placement date Placement time Site Days   Peripheral IV 12/23/22 18 G 1.16" Anterior;Right Forearm 12/23/22  1845  Forearm  less than 1   Peripheral IV 12/23/22 20 G 1.16" Posterior;Right Hand 12/23/22  1858  Hand  less than 1            Intake/Output Last 24 hours No intake or output data in the 24 hours ending 12/23/22 2220  Labs/Imaging Results for orders placed or performed during the hospital encounter of 12/23/22 (from the past 48 hour(s))  Basic metabolic panel     Status: Abnormal   Collection Time: 12/23/22  6:45 PM  Result Value Ref Range  Sodium 132 (L) 135 - 145 mmol/L   Potassium 4.1 3.5 - 5.1 mmol/L   Chloride 92 (L) 98 - 111 mmol/L   CO2 32 22 - 32 mmol/L   Glucose, Bld 112 (H) 70 - 99 mg/dL    Comment: Glucose reference range applies only to samples taken after fasting for at least 8 hours.   BUN 25 (H) 6 - 20 mg/dL   Creatinine, Ser 1.61 (H) 0.61 - 1.24 mg/dL   Calcium 8.0 (L) 8.9 - 10.3 mg/dL   GFR, Estimated >09 >60 mL/min    Comment: (NOTE) Calculated using the CKD-EPI Creatinine Equation (2021)    Anion gap 8 5 -  15    Comment: Performed at [facility], [address], [place], [place] [address]  CBC     Status: Abnormal   Collection Time: 12/23/22  6:45 PM  Result Value Ref Range   WBC 10.7 (H) 4.0 - 10.5 K/uL   RBC 5.79 4.22 - 5.81 MIL/uL   Hemoglobin 15.4 13.0 - 17.0 g/dL   HCT 81.1 91.4 - 78.2 %   MCV 89.1 80.0 - 100.0 fL   MCH 26.6 26.0 - 34.0 pg   MCHC 29.8 (L) 30.0 - 36.0 g/dL   RDW 95.6 (H) 21.3 - 08.6 %   Platelets 175 150 - 400 K/uL   nRBC 0.8 (H) 0.0 - 0.2 %    Comment: Performed at [facility], [address] [place], [place] [address]  Troponin I (High Sensitivity)     Status: Abnormal   Collection Time: 12/23/22  6:45 PM  Result Value Ref Range   Troponin I (High Sensitivity) 43 (H) <18 ng/L    Comment: (NOTE) Elevated high sensitivity troponin I (hsTnI) values and significant  changes across serial measurements may suggest ACS but many other  chronic and acute conditions are known to elevate hsTnI results.  Refer to the "Links" section for chest pain algorithms and additional  guidance. Performed at [facility], [address], [place], [place] [address]   Brain natriuretic peptide     Status: Abnormal   Collection Time: 12/23/22  6:45 PM  Result Value Ref Range   B Natriuretic Peptide 417.8 (H) 0.0 - 100.0 pg/mL    Comment: Performed at [facility], [address] [place], [place] [address]  Lactic acid, plasma     Status: None   Collection Time: 12/23/22  6:45 PM  Result Value Ref Range   Lactic Acid, Venous 1.6 0.5 - 1.9 mmol/L    Comment: Performed at [facility], [address], [place], [place] [address]   CT Angio Chest PE W and/or Wo Contrast  Result Date: 12/23/2022 CLINICAL DATA:  Shortness of breath EXAM: CT ANGIOGRAPHY CHEST WITH CONTRAST TECHNIQUE: Multidetector CT imaging of the chest was performed using the standard protocol during bolus administration  of intravenous contrast. Multiplanar CT image reconstructions and MIPs were obtained to evaluate the vascular anatomy. RADIATION DOSE REDUCTION: This exam was performed according to the departmental dose-optimization program which includes automated exposure control, adjustment of the mA and/or kV according to patient size and/or use of iterative reconstruction technique. CONTRAST:  80mL OMNIPAQUE IOHEXOL 350 MG/ML SOLN COMPARISON:  None Available. FINDINGS: Cardiovascular: Extensive pulmonary embolus involving the right pulmonary artery and distal left/interlobar pulmonary artery, and numerous bilateral proximal segmental/subsegmental branches. Overall heart size is normal. Markedly elevated RV to LV ratio of 2.3. No pericardial effusion. Normal caliber  thoracic aorta with mild atherosclerotic disease. Mild coronary artery calcifications. Mediastinum/Nodes: Esophagus and thyroid are unremarkable. No pathologically enlarged lymph nodes seen in the chest. Lungs/Pleura: Central airways are patent. Marked elevation of the right hemidiaphragm with adjacent atelectasis. Mild lingular atelectasis also seen. No pleural effusion or pneumothorax. Upper Abdomen: No acute abnormality. Musculoskeletal: No chest wall abnormality. No acute or significant osseous findings. Review of the MIP images confirms the above findings. IMPRESSION: 1. Extensive pulmonary embolus involving the right pulmonary artery and distal left/interlobar pulmonary artery, and numerous bilateral proximal segmental/subsegmental branches. 2. CT evidence of right heart strain (RV to LV ratio of 2.3). 3. Elevation of the right hemidiaphragm with adjacent atelectasis. 4. Coronary artery calcifications and aortic atherosclerosis (ICD10-I70.0). Critical Value/emergent results were called by telephone at the time of interpretation on 12/23/2022 at 9:02 pm to provider [facility] , who verbally acknowledged these results. Electronically Signed   By: Allegra Lai M.D.   On: 12/23/2022 21:04   VAS [place] LOWER EXTREMITY VENOUS (DVT) (ONLY [facility] & [facility])  Result Date: 12/23/2022  Lower Venous DVT Study Patient Name:  Kurt [name]  Date of Exam:   12/23/2022 Medical Rec #: [id number]      Accession #:    [id number] Date of Birth: Nov 06, 1960      Patient Gender: M Patient Age:   72 years Exam Location:  [facility] Procedure:      VAS [place] LOWER EXTREMITY VENOUS (DVT) Referring Phys: BRITNI HENDERLY --------------------------------------------------------------------------------  Indications: Pain and swelling x 1 month.  Limitations: Body habitus and poor ultrasound/tissue interface. Comparison Study: No previous exams Performing Technologist: Jody Hill RVT, RDMS  Examination Guidelines: A complete evaluation includes B-mode imaging, spectral Doppler, color Doppler, and power Doppler as needed of all accessible portions of each vessel. Bilateral testing is considered an integral part of a complete examination. Limited examinations for reoccurring indications may be performed as noted. The reflux portion of the exam is performed with the patient in reverse Trendelenburg.  +-----+---------------+---------+-----------+----------+--------------+ RIGHTCompressibilityPhasicitySpontaneityPropertiesThrombus Aging +-----+---------------+---------+-----------+----------+--------------+ CFV  Full           Yes      Yes                                 +-----+---------------+---------+-----------+----------+--------------+   +---------+---------------+---------+-----------+----------+-------------------+ LEFT     CompressibilityPhasicitySpontaneityPropertiesThrombus Aging      +---------+---------------+---------+-----------+----------+-------------------+ CFV      Full           No       Yes        pulsatile                     +---------+---------------+---------+-----------+----------+-------------------+ SFJ      Full                                                              +---------+---------------+---------+-----------+----------+-------------------+ FV Prox  Full           Yes      Yes                                      +---------+---------------+---------+-----------+----------+-------------------+ FV  Mid   Full           Yes      Yes                                      +---------+---------------+---------+-----------+----------+-------------------+ FV DistalFull           Yes      Yes                  Not well visualized +---------+---------------+---------+-----------+----------+-------------------+ PFV      Full                                                             +---------+---------------+---------+-----------+----------+-------------------+ POP      Partial        Yes      Yes                  Acute               +---------+---------------+---------+-----------+----------+-------------------+ PTV      None           No       No                   Acute               +---------+---------------+---------+-----------+----------+-------------------+ PERO                                                  Not visualized      +---------+---------------+---------+-----------+----------+-------------------+ Gastroc  None           No       No                   Acute               +---------+---------------+---------+-----------+----------+-------------------+  Left Technical Findings: Not visualized segments include peroneal veins.   Summary: RIGHT: - No evidence of common femoral vein obstruction.  LEFT: - Findings consistent with acute deep vein thrombosis involving the left popliteal vein, left posterior tibial veins, and left gastrocnemius veins. - No cystic structure found in the popliteal fossa.  *See table(s) above for measurements and observations.    Preliminary    DG Chest Port 1 View  Result Date: 12/23/2022 CLINICAL DATA:  COPD. EXAM: PORTABLE CHEST 1 VIEW COMPARISON:   Chest radiograph dated 01/13/2019. FINDINGS: Shallow inspiration with bibasilar linear and platelike atelectasis or scarring. No focal consolidation, pleural effusion, or pneumothorax. Top-normal cardiac silhouette. No acute osseous pathology. IMPRESSION: No acute cardiopulmonary process. Electronically Signed   By: Elgie Collard M.D.   On: 12/23/2022 19:39    Pending Labs Unresulted Labs (From admission, onward)     Start     Ordered   12/24/22 0500  CBC  Daily,   R     Comments: While on heparin drip    12/23/22 1949   12/24/22 0230  Heparin level (unfractionated)  Once-Timed,   URGENT        12/23/22 2053  12/23/22 2145  HIV Antibody (routine testing w rflx)  (HIV Antibody (Routine testing w reflex) panel)  Once,   R        12/23/22 2146   12/23/22 1814  Lactic acid, plasma  Now then every 2 hours,   R (with STAT occurrences)      12/23/22 1814   12/23/22 1814  Blood culture (routine x 2)  BLOOD CULTURE X 2,   R (with STAT occurrences)      12/23/22 1814            Vitals/Pain Today's Vitals   12/23/22 2015 12/23/22 2100 12/23/22 2143 12/23/22 2200  BP: 115/74 (!) 142/85  (!) 165/116  Pulse: 96 92  98  Resp: (!) 28 (!) 30  (!) 22  Temp:   98.1 F (36.7 C)   TempSrc:   Oral   SpO2: 90% (!) 89%  93%  Weight:      Height:      PainSc: 0-No pain   0-No pain    Isolation Precautions No active isolations  Medications Medications  heparin ADULT infusion 100 units/mL (25000 units/268mL) (1,650 Units/hr Intravenous New Bag/Given 12/23/22 2011)  pantoprazole (PROTONIX) EC tablet 40 mg (has no administration in time range)  methylPREDNISolone sodium succinate (SOLU-MEDROL) 125 mg/2 mL injection 125 mg (125 mg Intravenous Given 12/23/22 1847)  ipratropium-albuterol (DUONEB) 0.5-2.5 (3) MG/3ML nebulizer solution 3 mL (3 mLs Nebulization Given 12/23/22 1836)  heparin bolus via infusion 3,000 Units (3,000 Units Intravenous Bolus from Bag 12/23/22 2012)  iohexol (OMNIPAQUE) 350  MG/ML injection 75 mL (80 mLs Intravenous Contrast Given 12/23/22 2026)    Mobility walks with device     Focused Assessments See chart   R Recommendations: See Admitting Provider Note  Report given to:   Additional Notes: Pt is alert and oriented x 4; He is currently on 8L heated high flow O2 with sats between 90-94%. Denies pain.

## 2022-12-23 NOTE — Progress Notes (Addendum)
ANTICOAGULATION CONSULT NOTE - Initial Consult  Pharmacy Consult for heparin Indication: acute R LE DVT presumed PE  Allergies  Allergen Reactions   Penicillins Itching    Has patient had a PCN reaction causing immediate rash, facial/tongue/throat swelling, SOB or lightheadedness with hypotension: No Has patient had a PCN reaction causing severe rash involving mucus membranes or skin necrosis: No Has patient had a PCN reaction that required hospitalization: No Has patient had a PCN reaction occurring within the last 10 years: No If all of the above answers are "NO", then may proceed with Cephalosporin use.    Patient Measurements: Height: 5' 6.5" (168.9 cm) Weight: 118.8 kg (262 lb) IBW/kg (Calculated) : 64.95 Heparin Dosing Weight: 92.5 kg  Vital Signs: Temp: 97.7 F (36.5 C) (06/17 1744) Temp Source: Oral (06/17 1744) BP: 110/79 (06/17 1900) Pulse Rate: 101 (06/17 1900)  Labs: No results for input(s): "HGB", "HCT", "PLT", "APTT", "LABPROT", "INR", "HEPARINUNFRC", "HEPRLOWMOCWT", "CREATININE", "CKTOTAL", "CKMB", "TROPONINIHS" in the last 72 hours.  CrCl cannot be calculated (Patient's most recent lab result is older than the maximum 21 days allowed.).   Medical History: Past Medical History:  Diagnosis Date   Anxiety    Arthritis    "hands, knees, joints, lower back" (06/20/2017)   Borderline diabetes    Chronic bronchitis (HCC)    COPD (chronic obstructive pulmonary disease) (HCC)    dx'd 06/20/2017   Depression    GERD (gastroesophageal reflux disease)    Gout    Hypertension    OSA on CPAP    Assessment: 62 yo M with acute R LE DVT & presumed PE. Pharmacy consulted to manage heparin.  No anticoagulants PTA CBC WNL. SCr 1.25.  No bleeding reported  Goal of Therapy:  Heparin level 0.3-0.7 units/ml Monitor platelets by anticoagulation protocol: Yes   Plan:  Heparin 3000 units IV x 1 Heparin 1650 units/hr Check heparin level in 6 hours Daily heparin  level & CBC  Herby Abraham, Pharm.D Use secure chat for questions 12/23/2022 7:44 PM

## 2022-12-23 NOTE — ED Notes (Signed)
R.T. at bedside for nebs and Bi-Pap

## 2022-12-23 NOTE — ED Notes (Signed)
Pt's O2 sats 79% on 4L [place]. Pt in no distress and able to speak full sentences. R.T. and provider at bedside and pt placed on heated high flow O2. Pt'sdtr remains at bedside. Pt's O2 sats increased to 90%.

## 2022-12-23 NOTE — H&P (Signed)
History and Physical    Kurt [name] EXB:[id number] DOB: 1961-05-31 DOA: 12/23/2022  PCP: Fleet Contras, MD   Chief Complaint: sob  HPI: Kurt [name] is a 62 y.o. male with medical history significant of depression, GERD, COPD, heart failure with preserved ejection fraction, pulmonary hypertension, obstructive sleep apnea who presents emergency department with progressively worsening shortness of breath.  Patient typically is on 3 L nasal cannula at home.  He states that over the last 3 weeks has noticed worsening shortness of breath and weight gain.  He is also had limited access to his breathing treatments.  He has noticed worsening swelling of his left leg.  He is supposed to have a ultrasound to assess for DVT in outpatient setting however this was never performed due to patient being unable to ambulate.  He presented to the ER for further assessment.  He liters and tachypneic.  Labs were obtained which showed 132, creatinine 1.25 at baseline, WBC 10.7, hemoglobin 15.4 platelets 175, BNP 417 chest x-ray showed no acute is.  CT pulmonary embolism study demonstrated extensive pulm embolism in the right pulmonary artery with evidence of right heart strain. patient was given and admitted for further workup.  On evaluation he was satting in the mid 90s on 8 L nasal cannula increased work of breathing.  He denied a productive cough though has wheezing in his lungs as well as crackles.  He endorses chronic swelling in his legs.  He is unable to lay flat without feeling short of breath.  He denies infectious complaints.   Review of Systems: Review of Systems  All other systems reviewed and are negative.    As per HPI otherwise 10 point review of systems negative.   Allergies  Allergen Reactions   Penicillins Itching    Has patient had a PCN reaction causing immediate rash, facial/tongue/throat swelling, SOB or lightheadedness with hypotension: No Has patient had a PCN reaction causing severe  rash involving mucus membranes or skin necrosis: No Has patient had a PCN reaction that required hospitalization: No Has patient had a PCN reaction occurring within the last 10 years: No If all of the above answers are "NO", then may proceed with Cephalosporin use.    Past Medical History:  Diagnosis Date   Anxiety    Arthritis    "hands, knees, joints, lower back" (06/20/2017)   Borderline diabetes    Chronic bronchitis (HCC)    COPD (chronic obstructive pulmonary disease) (HCC)    dx'd 06/20/2017   Depression    GERD (gastroesophageal reflux disease)    Gout    Hypertension    OSA on CPAP     Past Surgical History:  Procedure Laterality Date   NO PAST SURGERIES       reports that he quit smoking about 16 years ago. His smoking use included cigarettes. He has a 30.00 pack-year smoking history. He has never used smokeless tobacco. He reports that he does not drink alcohol and does not use drugs.  Family History  Problem Relation Age of Onset   Hypertension Mother     Prior to Admission medications   Medication Sig Start Date End Date Taking? Authorizing Provider  albuterol (PROVENTIL) (2.5 MG/3ML) 0.083% nebulizer solution Take 3 mLs (2.5 mg total) by nebulization every 6 (six) hours as needed for wheezing or shortness of breath. 03/30/21  Yes Coralyn Helling, MD  aspirin EC 325 MG tablet Take 325 mg by mouth daily.   Yes [provider]  cetirizine (ZYRTEC)  10 MG tablet Take 10 mg by mouth as needed. 12/30/17  Yes [provider]  famotidine (PEPCID) 20 MG tablet One after supper 01/13/19  Yes Nyoka Cowden, MD  fluticasone [facility]) 50 MCG/ACT nasal spray Place 1 spray into both nostrils daily for 14 days. 12/23/17 12/23/22 Yes Lucia Estelle, NP  furosemide (LASIX) 40 MG tablet Take 40 mg by mouth daily.   Yes [provider]  ibuprofen (ADVIL,MOTRIN) 800 MG tablet Take 800 mg by mouth 3 (three) times daily. 02/27/18  Yes [provider]   losartan-hydrochlorothiazide (HYZAAR) 50-12.5 MG tablet TAKE 1 TABLET BY MOUTH EVERY DAY 04/12/19  Yes Nyoka Cowden, MD  montelukast (SINGULAIR) 10 MG tablet Take 1 tablet (10 mg total) by mouth at bedtime. 03/30/21  Yes Coralyn Helling, MD  potassium chloride (K-DUR) 10 MEQ tablet Take 10 mEq by mouth daily. 06/11/17  Yes [provider]  tadalafil (CIALIS) 20 MG tablet Take 20 mg by mouth daily as needed for erectile dysfunction. 12/09/22  Yes [provider]  tiZANidine (ZANAFLEX) 4 MG tablet Take 4 mg by mouth every 6 (six) hours as needed for muscle spasms.   Yes [provider]  Vitamin D, Ergocalciferol, (DRISDOL) 50000 units CAPS capsule Take 50,000 Units by mouth once a week. 06/18/17  Yes [provider]  Budeson-Glycopyrrol-Formoterol (BREZTRI AEROSPHERE) 160-9-4.8 MCG/ACT AERO Inhale 2 puffs into the lungs in the morning and at bedtime. 03/30/21   Coralyn Helling, MD  Chlorphen-PE-Acetaminophen (NOREL AD) 4-10-325 MG TABS Take by mouth 3 times/day as needed-between meals & bedtime.    [provider]  MITIGARE 0.6 MG CAPS Take 1 capsule by mouth 2 (two) times daily as needed. 03/10/19   [provider]  pantoprazole (PROTONIX) 40 MG tablet Take 40 mg by mouth daily before breakfast.    [provider]  UNABLE TO FIND Med Name: CPAP with 3lpm o2  Adapt    [provider]    Physical Exam: Vitals:   12/23/22 2015 12/23/22 2100 12/23/22 2143 12/23/22 2200  BP: 115/74 (!) 142/85  (!) 165/116  Pulse: 96 92  98  Resp: (!) 28 (!) 30  (!) 22  Temp:   98.1 F (36.7 C)   TempSrc:   Oral   SpO2: 90% (!) 89%  93%  Weight:      Height:       Physical Exam Vitals reviewed.  Constitutional:      Appearance: He is obese.  HENT:     Head: Normocephalic.  Cardiovascular:     Rate and Rhythm: Normal rate.  Pulmonary:     Effort: Tachypnea present.     Breath sounds: Wheezing and rales present.  Abdominal:     Palpations:  Abdomen is soft.  Musculoskeletal:        General: Normal range of motion.     Cervical back: Normal range of motion.  Skin:    General: Skin is warm.     Capillary Refill: Capillary refill takes less than 2 seconds.  Neurological:     General: No focal deficit present.  Psychiatric:        Mood and Affect: Mood normal.        Labs on Admission: I have personally reviewed the patients's labs and imaging studies.  Assessment/Plan Principal Problem:   Acute hypoxic respiratory failure (HCC)   # Acute on chronic hypoxic respiratory failure most likely due to pulmonary edema in setting of acute PE, CHF exacerbation, POA,  active # Submassive pulmonary embolism, POA, active - Patient endorsing swelling in his left leg and was post have outpatient ultrasound however developed worsening shortness of breath - CT shows evidence of right heart strain - No infectious complaints - Patient uses inhalers at home for COPD and is noncompliant - requiring up to 8L in ED  Plan: Continue DuoNebs, Pulmicort and Brovana Continue IV heparin Obtain echocardiogram Hold p.o. Lasix. Continue diuresis  Hypertension-hold losartan and hydrochlorothiazide  GERD-continue pantoprazole  History of COPD-placed on nebulizers  OSA-patient was ordered for BiPAP however is noncompliant  CAD-hold aspirin   Admission status: Inpatient Stepdown  Certification: The appropriate patient status for this patient is INPATIENT. Inpatient status is judged to be reasonable and necessary in order to provide the required intensity of service to ensure the patient's safety. The patient's presenting symptoms, physical exam findings, and initial radiographic and laboratory data in the context of their chronic comorbidities is felt to place them at high risk for further clinical deterioration. Furthermore, it is not anticipated that the patient will be medically stable for discharge from the hospital within 2 midnights of  admission.   * I certify that at the point of admission it is my clinical judgment that the patient will require inpatient hospital care spanning beyond 2 midnights from the point of admission due to high intensity of service, high risk for further deterioration and high frequency of surveillance required.Alan Mulder MD [facility] If 7PM-7AM, please contact night-coverage [email or web address]  12/23/2022, 11:40 PM

## 2022-12-24 ENCOUNTER — Other Ambulatory Visit (HOSPITAL_COMMUNITY): Payer: Self-pay

## 2022-12-24 ENCOUNTER — Inpatient Hospital Stay (HOSPITAL_COMMUNITY): Payer: Medicaid Other

## 2022-12-24 ENCOUNTER — Ambulatory Visit (HOSPITAL_BASED_OUTPATIENT_CLINIC_OR_DEPARTMENT_OTHER): Payer: Medicaid Other | Admitting: Pulmonary Disease

## 2022-12-24 DIAGNOSIS — I2489 Other forms of acute ischemic heart disease: Secondary | ICD-10-CM | POA: Insufficient documentation

## 2022-12-24 DIAGNOSIS — I2781 Cor pulmonale (chronic): Secondary | ICD-10-CM | POA: Insufficient documentation

## 2022-12-24 DIAGNOSIS — I2699 Other pulmonary embolism without acute cor pulmonale: Secondary | ICD-10-CM

## 2022-12-24 DIAGNOSIS — I5021 Acute systolic (congestive) heart failure: Secondary | ICD-10-CM | POA: Diagnosis not present

## 2022-12-24 DIAGNOSIS — J9601 Acute respiratory failure with hypoxia: Secondary | ICD-10-CM | POA: Diagnosis not present

## 2022-12-24 DIAGNOSIS — I82409 Acute embolism and thrombosis of unspecified deep veins of unspecified lower extremity: Secondary | ICD-10-CM | POA: Insufficient documentation

## 2022-12-24 LAB — CBC
HCT: 52.5 % — ABNORMAL HIGH (ref 39.0–52.0)
Hemoglobin: 15.8 g/dL (ref 13.0–17.0)
MCH: 26.9 pg (ref 26.0–34.0)
MCHC: 30.1 g/dL (ref 30.0–36.0)
MCV: 89.3 fL (ref 80.0–100.0)
Platelets: 178 10*3/uL (ref 150–400)
RBC: 5.88 MIL/uL — ABNORMAL HIGH (ref 4.22–5.81)
RDW: 15.7 % — ABNORMAL HIGH (ref 11.5–15.5)
WBC: 8.7 10*3/uL (ref 4.0–10.5)
nRBC: 1.4 % — ABNORMAL HIGH (ref 0.0–0.2)

## 2022-12-24 LAB — ECHOCARDIOGRAM COMPLETE
AR max vel: 2.48 cm2
AV Area VTI: 2.97 cm2
AV Area mean vel: 2.39 cm2
AV Mean grad: 3 mmHg
AV Peak grad: 5.8 mmHg
Ao pk vel: 1.2 m/s
Area-P 1/2: 2.6 cm2
Height: 66.5 in
MV VTI: 2.98 cm2
S' Lateral: 2.8 cm
Single Plane A4C EF: 75.6 %
Weight: 4192 oz

## 2022-12-24 LAB — MRSA NEXT GEN BY PCR, NASAL: MRSA by PCR Next Gen: NOT DETECTED

## 2022-12-24 LAB — HEPARIN LEVEL (UNFRACTIONATED)
Heparin Unfractionated: 0.92 IU/mL — ABNORMAL HIGH (ref 0.30–0.70)
Heparin Unfractionated: 0.6 IU/mL (ref 0.30–0.70)
Heparin Unfractionated: 0.62 IU/mL (ref 0.30–0.70)

## 2022-12-24 LAB — HIV ANTIBODY (ROUTINE TESTING W REFLEX): HIV Screen 4th Generation wRfx: NONREACTIVE

## 2022-12-24 LAB — CULTURE, BLOOD (ROUTINE X 2)

## 2022-12-24 MED ORDER — ORAL CARE MOUTH RINSE
15.0000 mL | OROMUCOSAL | Status: DC
  Administered 2022-12-24 – 2022-12-29 (×10): 15 mL via OROMUCOSAL

## 2022-12-24 MED ORDER — CHLORHEXIDINE GLUCONATE CLOTH 2 % EX PADS
6.0000 | MEDICATED_PAD | Freq: Every day | CUTANEOUS | Status: DC
  Administered 2022-12-24 – 2022-12-31 (×7): 6 via TOPICAL

## 2022-12-24 MED ORDER — ORAL CARE MOUTH RINSE: 15.0000 mL | OROMUCOSAL | Status: DC | PRN

## 2022-12-24 MED ORDER — IPRATROPIUM-ALBUTEROL 0.5-2.5 (3) MG/3ML IN SOLN
3.0000 mL | Freq: Three times a day (TID) | RESPIRATORY_TRACT | Status: DC
  Administered 2022-12-24 – 2022-12-30 (×18): 3 mL via RESPIRATORY_TRACT
  Filled 2022-12-24 (×19): qty 3

## 2022-12-24 NOTE — Progress Notes (Signed)
ANTICOAGULATION CONSULT NOTE Pharmacy Consult for heparin Indication: acute R LE DVT and PE  Allergies  Allergen Reactions   Penicillins Itching    Has patient had a PCN reaction causing immediate rash, facial/tongue/throat swelling, SOB or lightheadedness with hypotension: No Has patient had a PCN reaction causing severe rash involving mucus membranes or skin necrosis: No Has patient had a PCN reaction that required hospitalization: No Has patient had a PCN reaction occurring within the last 10 years: No If all of the above answers are "NO", then may proceed with Cephalosporin use.    Patient Measurements: Height: 5' 6.5" (168.9 cm) Weight: 118.8 kg (262 lb) IBW/kg (Calculated) : 64.95 Heparin Dosing Weight: 92.5 kg  Vital Signs: Temp: 98.6 F (37 C) (06/18 0415) Temp Source: Oral (06/18 0415) BP: 131/81 (06/18 0415) Pulse Rate: 95 (06/18 0415)  Labs: Recent Labs    12/23/22 1845 12/23/22 2209 12/24/22 0400 12/24/22 0446  HGB 15.4  --  15.8  --   HCT 51.6  --  52.5*  --   PLT 175  --  178  --   HEPARINUNFRC  --   --  0.92* 0.60  CREATININE 1.25*  --   --   --   TROPONINIHS 43* 42*  --   --     Estimated Creatinine Clearance: 76.9 mL/min (A) (by C-G formula based on SCr of 1.25 mg/dL (H)).   Medical History: Past Medical History:  Diagnosis Date   Anxiety    Arthritis    "hands, knees, joints, lower back" (06/20/2017)   Borderline diabetes    Chronic bronchitis (HCC)    COPD (chronic obstructive pulmonary disease) (HCC)    dx'd 06/20/2017   Depression    GERD (gastroesophageal reflux disease)    Gout    Hypertension    OSA on CPAP    Assessment: 62 yo M with acute R LE DVT & presumed PE. Pharmacy consulted to manage heparin.  No anticoagulants PTA CBC WNL. SCr 1.25.  No bleeding reported  12/24/2022: Initial heparin level 0.6-therapeutic on IV heparin 1650 units/hr. Initial draw 0.9 drawn from same hand as heparin infusion.  Recollect obtained from  opposite arm. CBC WNL No bleeding or infusion related concerns reported by RN  Goal of Therapy:  Heparin level 0.3-0.7 units/ml Monitor platelets by anticoagulation protocol: Yes   Plan:  Continue Heparin infusion at 1650 units/hr Recheck heparin level at 1100 Daily heparin level & CBC while on heparin F/U long-term anticoagulation plans  Junita Push, PharmD, BCPS 12/24/2022 5:23 AM

## 2022-12-24 NOTE — Progress Notes (Addendum)
Pt is still awake, watching tv and not ready for assistance with his home bipap.  Pt will let his nurse know when he is ready for bed.  RN aware.

## 2022-12-24 NOTE — ED Notes (Signed)
Spoke with admitting provider, Dr Avie Arenas, regarding pt's concern with taking IV Lasix at this time because he took home dose prior to arrival and has had approx output of urine since 1900. Dr Avie Arenas gave verbal order to hold 0000 dose.

## 2022-12-24 NOTE — Progress Notes (Signed)
ANTICOAGULATION CONSULT NOTE  Pharmacy Consult for heparin Indication: acute R LE DVT and PE  Allergies  Allergen Reactions   Penicillins Itching    Has patient had a PCN reaction causing immediate rash, facial/tongue/throat swelling, SOB or lightheadedness with hypotension: No Has patient had a PCN reaction causing severe rash involving mucus membranes or skin necrosis: No Has patient had a PCN reaction that required hospitalization: No Has patient had a PCN reaction occurring within the last 10 years: No If all of the above answers are "NO", then may proceed with Cephalosporin use.    Patient Measurements: Height: 5' 6.5" (168.9 cm) Weight: 118.8 kg (262 lb) IBW/kg (Calculated) : 64.95 Heparin Dosing Weight: 92.5 kg  Vital Signs: Temp: 98.3 F (36.8 C) (06/18 0830) Temp Source: Oral (06/18 0830) BP: 110/67 (06/18 1000) Pulse Rate: 96 (06/18 1000)  Labs: Recent Labs    12/23/22 1845 12/23/22 2209 12/24/22 0400 12/24/22 0446 12/24/22 1126  HGB 15.4  --  15.8  --   --   HCT 51.6  --  52.5*  --   --   PLT 175  --  178  --   --   HEPARINUNFRC  --   --  0.92* 0.60 0.62  CREATININE 1.25*  --   --   --   --   TROPONINIHS 43* 42*  --   --   --      Estimated Creatinine Clearance: 76.9 mL/min (A) (by C-G formula based on SCr of 1.25 mg/dL (H)).   Medical History: Past Medical History:  Diagnosis Date   Anxiety    Arthritis    "hands, knees, joints, lower back" (06/20/2017)   Borderline diabetes    Chronic bronchitis (HCC)    COPD (chronic obstructive pulmonary disease) (HCC)    dx'd 06/20/2017   Depression    GERD (gastroesophageal reflux disease)    Gout    Hypertension    OSA on CPAP    Assessment: 62 yo M with acute R LE DVT & extensive PE. CT evidence of right heart strain. Pharmacy consulted to manage heparin.  No anticoagulants PTA. CBC WNL.  12/24/2022: Heparin level therapeutic on heparin infusion at 1650 units/hr CBC WNL No bleeding or infusion  related concerns noted  Goal of Therapy:  Heparin level 0.3-0.7 units/ml Monitor platelets by anticoagulation protocol: Yes   Plan:  -Continue Heparin infusion at 1650 units/hr -Daily heparin level & CBC while on heparin -F/U long-term anticoagulation plans  Pricilla Riffle, PharmD, BCPS Clinical Pharmacist 12/24/2022 12:49 PM

## 2022-12-24 NOTE — TOC Benefit Eligibility Note (Signed)
Pharmacy Patient Advocate Encounter  Insurance verification completed.    The patient is insured through [facility] MEDICAID    Ran test claim for Xarelto and the current 30 day co-pay is $4.00.  Ran test claim for Eliquis and the current 30 day co-pay is $4.00.   This test claim was processed through [facility]- copay amounts may vary at other pharmacies due to pharmacy/plan contracts, or as the patient moves through the different stages of their insurance plan.

## 2022-12-24 NOTE — Progress Notes (Signed)
PROGRESS NOTE    Kurt [name]  UJW:[id number] DOB: 05/28/1961 DOA: 12/23/2022 PCP: Fleet Contras, MD     Brief Narrative:  Kurt [name] is a 62 y.o. male with medical history significant of depression, GERD, COPD, heart failure with preserved ejection fraction, pulmonary hypertension, obstructive sleep apnea who presents emergency department with progressively worsening shortness of breath.  Patient typically is on 3 L nasal cannula at home.  He states that over the last 3 weeks has noticed worsening shortness of breath and weight gain.  He is also had limited access to his breathing treatments.  He has noticed worsening swelling of his left leg.  He is supposed to have a ultrasound to assess for DVT in outpatient setting.   CT pulmonary embolism study demonstrated extensive pulm embolism in the right pulmonary artery with evidence of right heart strain.  In the emergency department, patient required up to 12 L high flow oxygen to maintain his saturations.  Case discussed with PCCM, they referred for [facility] admission.  New events last 24 hours / Subjective: Patient doing much better this morning since moving up to stepdown unit.  Down to 8 L high flow oxygen satting mid 90s.  Assessment & Plan:   Principal Problem:   Acute hypoxic respiratory failure (HCC) Active Problems:   OSA on CPAP   Essential hypertension   Morbid obesity due to excess calories (HCC)   GERD without esophagitis   Acute pulmonary embolism (HCC)   Cor pulmonale (HCC)   DVT (deep venous thrombosis) (HCC)   Demand ischemia   Unprovoked PE and DVT -DVT ultrasound positive for left-sided DVT -Echocardiogram shows EF 70 to 75%, grade 1 diastolic dysfunction, elevated right ventricular pressure overload, consistent with cor pulmonale -Patient remains hemodynamically stable with BP 110/67 -IV heparin  Acute on chronic hypoxemic respiratory failure secondary to PE -Uses 3 L nasal cannula O2 at baseline, currently on 8  L.  Continue to wean as able  Demand ischemia -Secondary to above -Troponin 43, 42  Hypertension -Holding home losartan, HCTZ, Lasix for hemodynamic support in setting of PE  GERD -PPI  OSA -CPAP nightly  Obesity -BMI 41.65   DVT prophylaxis: IV heparin Place TED hose Start: 12/23/22 2145  Code Status: Full code Family Communication: No family at bedside Disposition Plan: Home Status is: Inpatient Remains inpatient appropriate because: IV heparin    Antimicrobials:  Anti-infectives (From admission, onward)    Start     Dose/Rate Route Frequency Ordered Stop   12/23/22 2345  azithromycin (ZITHROMAX) tablet 500 mg  Status:  Discontinued        500 mg Oral Daily at bedtime 12/23/22 2339 12/23/22 2343        Objective: Vitals:   12/24/22 0800 12/24/22 0830 12/24/22 0900 12/24/22 1000  BP: 119/86  107/71 110/67  Pulse: 95 93 94 96  Resp: (!) 28 (!) 24 20 (!) 21  Temp:  98.3 F (36.8 C)    TempSrc:  Oral    SpO2: 96% 95% 91% (!) 88%  Weight:      Height:        Intake/Output Summary (Last 24 hours) at 12/24/2022 1314 Last data filed at 12/24/2022 0930 Gross per 24 hour  Intake 360 ml  Output 2350 ml  Net -1990 ml   Filed Weights   12/23/22 1744  Weight: 118.8 kg    Examination:  General exam: Appears calm and comfortable  Respiratory system: Clear to auscultation. Respiratory effort normal. No respiratory distress.  No conversational dyspnea.  On 8 L oxygen Cardiovascular system: S1 & S2 heard, RRR. No murmurs. No pedal edema. Gastrointestinal system: Abdomen is nondistended, soft and nontender. Normal bowel sounds heard. Central nervous system: Alert and oriented. No focal neurological deficits. Speech clear.  Extremities: Symmetric in appearance  Skin: No rashes, lesions or ulcers on exposed skin  Psychiatry: Judgement and insight appear normal. Mood & affect appropriate.   Data Reviewed: I have personally reviewed following labs and imaging  studies  CBC: Recent Labs  Lab 12/23/22 1845 12/24/22 0400  WBC 10.7* 8.7  HGB 15.4 15.8  HCT 51.6 52.5*  MCV 89.1 89.3  PLT 175 178   Basic Metabolic Panel: Recent Labs  Lab 12/23/22 1845  NA 132*  K 4.1  CL 92*  CO2 32  GLUCOSE 112*  BUN 25*  CREATININE 1.25*  CALCIUM 8.0*   GFR: Estimated Creatinine Clearance: 76.9 mL/min (A) (by C-G formula based on SCr of 1.25 mg/dL (H)). Liver Function Tests: No results for input(s): "AST", "ALT", "ALKPHOS", "BILITOT", "PROT", "ALBUMIN" in the last 168 hours. No results for input(s): "LIPASE", "AMYLASE" in the last 168 hours. No results for input(s): "AMMONIA" in the last 168 hours. Coagulation Profile: No results for input(s): "INR", "PROTIME" in the last 168 hours. Cardiac Enzymes: No results for input(s): "CKTOTAL", "CKMB", "CKMBINDEX", "TROPONINI" in the last 168 hours. BNP (last 3 results) No results for input(s): "PROBNP" in the last 8760 hours. HbA1C: No results for input(s): "HGBA1C" in the last 72 hours. CBG: No results for input(s): "GLUCAP" in the last 168 hours. Lipid Profile: No results for input(s): "CHOL", "HDL", "LDLCALC", "TRIG", "CHOLHDL", "LDLDIRECT" in the last 72 hours. Thyroid Function Tests: No results for input(s): "TSH", "T4TOTAL", "FREET4", "T3FREE", "THYROIDAB" in the last 72 hours. Anemia Panel: No results for input(s): "VITAMINB12", "FOLATE", "FERRITIN", "TIBC", "IRON", "RETICCTPCT" in the last 72 hours. Sepsis Labs: Recent Labs  Lab 12/23/22 1845 12/23/22 2209  LATICACIDVEN 1.6 1.2    Recent Results (from the past 240 hour(s))  Blood culture (routine x 2)     Status: None (Preliminary result)   Collection Time: 12/23/22  6:45 PM   Specimen: BLOOD RIGHT FOREARM  Result Value Ref Range Status   Specimen Description   Final    BLOOD RIGHT FOREARM Performed at [facility], [address] [place], [place] [address]    Special Requests   Final    BOTTLES DRAWN AEROBIC  AND ANAEROBIC Blood Culture results may not be optimal due to an excessive volume of blood received in culture bottles Performed at [facility], [address] [place], [place] [address]    Culture   Final    NO GROWTH < 12 HOURS Performed at [facility], [address] [place], [place] [address]    Report Status PENDING  Incomplete  Blood culture (routine x 2)     Status: None (Preliminary result)   Collection Time: 12/23/22  6:58 PM   Specimen: BLOOD RIGHT HAND  Result Value Ref Range Status   Specimen Description   Final    BLOOD RIGHT HAND Performed at [facility], [address], [place], [place] [address]    Special Requests   Final    BOTTLES DRAWN AEROBIC AND ANAEROBIC Blood Culture results may not be optimal due to an excessive volume of blood received in culture bottles Performed at [facility], [address] [place], [place] [address]  Culture   Final    NO GROWTH < 12 HOURS Performed at [facility], [address], [place], [place] [address]    Report Status PENDING  Incomplete  MRSA Next Gen by PCR, Nasal     Status: None   Collection Time: 12/24/22  9:16 AM   Specimen: Nasal Mucosa; Nasal Swab  Result Value Ref Range Status   MRSA by PCR Next Gen NOT DETECTED NOT DETECTED Final    Comment: (NOTE) The GeneXpert MRSA Assay (FDA approved for NASAL specimens only), is one component of a comprehensive MRSA colonization surveillance program. It is not intended to diagnose MRSA infection nor to guide or monitor treatment for MRSA infections. Test performance is not FDA approved in patients less than 31 years old. Performed at [facility], [address], [place], [place] [address]       Radiology Studies: ECHOCARDIOGRAM COMPLETE  Result Date: 12/24/2022    ECHOCARDIOGRAM REPORT   Patient Name:   KHAMRYN BRITO Date of Exam: 12/24/2022 Medical Rec #:  [id number]      Height:       66.5 in Accession #:    [id number]    Weight:       262.0 lb Date of Birth:  02/12/1961     BSA:          2.256 m Patient Age:    61 years      BP:           107/71 mmHg Patient Gender: M             HR:           96 bpm. Exam Location:  Inpatient Procedure: 2D Echo, Cardiac Doppler and Color Doppler Indications:    CHF - Acute Systolic  History:        Patient has prior history of Echocardiogram examinations, most                 recent 06/21/2017. COPD, Signs/Symptoms:acute respiratory                 failure, Edema, Dyspnea, Shortness of Breath and Chest Pain;                 Risk Factors:Sleep Apnea, Hypertension, Diabetes and Former                 Smoker.  Sonographer:    Wallie Char Referring Phys: [id number] ROBERT DORRELL  Sonographer Comments: Technically difficult study due to poor echo windows and patient is obese. Image acquisition challenging due to patient body habitus and Limited exam due to patient inability to tolerate probe pressure. IMPRESSIONS  1. Left ventricular ejection fraction, by estimation, is 70 to 75%. The left ventricle has hyperdynamic function. The left ventricle has no regional wall motion abnormalities. There is mild concentric left ventricular hypertrophy. Left ventricular diastolic parameters are consistent with Grade I diastolic dysfunction (impaired relaxation). There is the interventricular septum is flattened in systole, consistent with right ventricular pressure overload.  2. Right ventricular systolic function is mildly reduced. The right ventricular size is severely enlarged. There is severely elevated pulmonary artery systolic pressure. The estimated right ventricular systolic pressure is 68.6 mmHg.  3. Left atrial size was mildly dilated.  4. Right atrial size was moderately dilated.  5. The mitral valve is normal in structure. No evidence of mitral valve regurgitation. No evidence of mitral stenosis.  6. The aortic valve is tricuspid. Aortic valve  regurgitation is not visualized. No aortic stenosis is present.  7. The inferior vena cava is dilated in size with <50% respiratory variability, suggesting right atrial pressure of 15 mmHg. Comparison(s): Prior images unable to be directly viewed, comparison made by report only. No significant change from prior study. Conclusion(s)/Recommendation(s): Findings consistent with Cor Pulmonale. Some features suggest chronic cor pulmonale, but cannot exclude a component of superimposed acute cor pulmonale due to acute pulmonary embolism. FINDINGS  Left Ventricle: Left ventricular ejection fraction, by estimation, is 70 to 75%. The left ventricle has hyperdynamic function. The left ventricle has no regional wall motion abnormalities. The left ventricular internal cavity size was normal in size. There is mild concentric left ventricular hypertrophy. The interventricular septum is flattened in systole, consistent with right ventricular pressure overload. Left ventricular diastolic parameters are consistent with Grade I diastolic dysfunction (impaired relaxation). Normal left ventricular filling pressure. Right Ventricle: The right ventricular size is severely enlarged. Right vetricular wall thickness was not well visualized. Right ventricular systolic function is mildly reduced. There is severely elevated pulmonary artery systolic pressure. The tricuspid  regurgitant velocity is 3.66 m/s, and with an assumed right atrial pressure of 15 mmHg, the estimated right ventricular systolic pressure is 68.6 mmHg. Left Atrium: Left atrial size was mildly dilated. Right Atrium: Right atrial size was moderately dilated. Pericardium: There is no evidence of pericardial effusion. Mitral Valve: The mitral valve is normal in structure. No evidence of mitral valve regurgitation. No evidence of mitral valve stenosis. MV peak gradient, 5.1 mmHg. The mean mitral valve gradient is 2.0 mmHg. Tricuspid Valve: The tricuspid valve is normal in  structure. Tricuspid valve regurgitation is mild. Aortic Valve: The aortic valve is tricuspid. Aortic valve regurgitation is not visualized. No aortic stenosis is present. Aortic valve mean gradient measures 3.0 mmHg. Aortic valve peak gradient measures 5.8 mmHg. Aortic valve area, by VTI measures 2.97 cm. Pulmonic Valve: The pulmonic valve was grossly normal. Pulmonic valve regurgitation is not visualized. No evidence of pulmonic stenosis. Aorta: The aortic root and ascending aorta are structurally normal, with no evidence of dilitation. Venous: The inferior vena cava is dilated in size with less than 50% respiratory variability, suggesting right atrial pressure of 15 mmHg. IAS/Shunts: The interatrial septum was not well visualized.  LEFT VENTRICLE PLAX 2D LVIDd:         4.80 cm     Diastology LVIDs:         2.80 cm     LV e' medial:    7.00 cm/s LV PW:         1.30 cm     LV E/e' medial:  10.8 LV IVS:        1.30 cm     LV e' lateral:   16.10 cm/s LVOT diam:     1.90 cm     LV E/e' lateral: 4.7 LV SV:         56 LV SV Index:   25 LVOT Area:     2.84 cm  LV Volumes (MOD) LV vol d, MOD A4C: 71.0 ml LV vol s, MOD A4C: 17.3 ml LV SV MOD A4C:     71.0 ml RIGHT VENTRICLE             IVC RV Basal diam:  5.60 cm     IVC diam: 2.50 cm RV S prime:     90.00 cm/s TAPSE (M-mode): 1.6 cm LEFT ATRIUM  Index        RIGHT ATRIUM           Index LA diam:        4.10 cm 1.82 cm/m   RA Area:     23.90 cm LA Vol (A2C):   60.9 ml 27.00 ml/m  RA Volume:   72.20 ml  32.01 ml/m LA Vol (A4C):   58.7 ml 26.02 ml/m LA Biplane Vol: 60.7 ml 26.91 ml/m  AORTIC VALVE AV Area (Vmax):    2.48 cm AV Area (Vmean):   2.39 cm AV Area (VTI):     2.97 cm AV Vmax:           120.00 cm/s AV Vmean:          80.300 cm/s AV VTI:            0.187 m AV Peak Grad:      5.8 mmHg AV Mean Grad:      3.0 mmHg LVOT Vmax:         105.00 cm/s LVOT Vmean:        67.700 cm/s LVOT VTI:          0.196 m LVOT/AV VTI ratio: 1.05  AORTA Ao Root diam:  3.60 cm Ao Asc diam:  3.40 cm MITRAL VALVE                TRICUSPID VALVE MV Area (PHT): 2.60 cm     TR Peak grad:   53.6 mmHg MV Area VTI:   2.98 cm     TR Vmax:        366.00 cm/s MV Peak grad:  5.1 mmHg MV Mean grad:  2.0 mmHg     SHUNTS MV Vmax:       1.13 m/s     Systemic VTI:  0.20 m MV Vmean:      66.1 cm/s    Systemic Diam: 1.90 cm MV Decel Time: 292 msec MV E velocity: 75.30 cm/s MV A velocity: 102.00 cm/s MV E/A ratio:  0.74 Mihai Croitoru MD Electronically signed by Thurmon Fair MD Signature Date/Time: 12/24/2022/10:25:20 AM    Final    CT Angio Chest PE W and/or Wo Contrast  Result Date: 12/23/2022 CLINICAL DATA:  Shortness of breath EXAM: CT ANGIOGRAPHY CHEST WITH CONTRAST TECHNIQUE: Multidetector CT imaging of the chest was performed using the standard protocol during bolus administration of intravenous contrast. Multiplanar CT image reconstructions and MIPs were obtained to evaluate the vascular anatomy. RADIATION DOSE REDUCTION: This exam was performed according to the departmental dose-optimization program which includes automated exposure control, adjustment of the mA and/or kV according to patient size and/or use of iterative reconstruction technique. CONTRAST:  80mL OMNIPAQUE IOHEXOL 350 MG/ML SOLN COMPARISON:  None Available. FINDINGS: Cardiovascular: Extensive pulmonary embolus involving the right pulmonary artery and distal left/interlobar pulmonary artery, and numerous bilateral proximal segmental/subsegmental branches. Overall heart size is normal. Markedly elevated RV to LV ratio of 2.3. No pericardial effusion. Normal caliber thoracic aorta with mild atherosclerotic disease. Mild coronary artery calcifications. Mediastinum/Nodes: Esophagus and thyroid are unremarkable. No pathologically enlarged lymph nodes seen in the chest. Lungs/Pleura: Central airways are patent. Marked elevation of the right hemidiaphragm with adjacent atelectasis. Mild lingular atelectasis also seen. No  pleural effusion or pneumothorax. Upper Abdomen: No acute abnormality. Musculoskeletal: No chest wall abnormality. No acute or significant osseous findings. Review of the MIP images confirms the above findings. IMPRESSION: 1. Extensive pulmonary embolus involving the right pulmonary artery and distal left/interlobar  pulmonary artery, and numerous bilateral proximal segmental/subsegmental branches. 2. CT evidence of right heart strain (RV to LV ratio of 2.3). 3. Elevation of the right hemidiaphragm with adjacent atelectasis. 4. Coronary artery calcifications and aortic atherosclerosis (ICD10-I70.0). Critical Value/emergent results were called by telephone at the time of interpretation on 12/23/2022 at 9:02 pm to provider [facility] , who verbally acknowledged these results. Electronically Signed   By: Allegra Lai M.D.   On: 12/23/2022 21:04   VAS [place] LOWER EXTREMITY VENOUS (DVT) (ONLY [facility] & [facility])  Result Date: 12/23/2022  Lower Venous DVT Study Patient Name:  KERRY BARKLOW  Date of Exam:   12/23/2022 Medical Rec #: [id number]      Accession #:    [id number] Date of Birth: August 16, 1960      Patient Gender: M Patient Age:   48 years Exam Location:  [facility] Procedure:      VAS [place] LOWER EXTREMITY VENOUS (DVT) Referring Phys: BRITNI HENDERLY --------------------------------------------------------------------------------  Indications: Pain and swelling x 1 month.  Limitations: Body habitus and poor ultrasound/tissue interface. Comparison Study: No previous exams Performing Technologist: Jody Hill RVT, RDMS  Examination Guidelines: A complete evaluation includes B-mode imaging, spectral Doppler, color Doppler, and power Doppler as needed of all accessible portions of each vessel. Bilateral testing is considered an integral part of a complete examination. Limited examinations for reoccurring indications may be performed as noted. The reflux portion of the exam is performed with the patient in reverse  Trendelenburg.  +-----+---------------+---------+-----------+----------+--------------+ RIGHTCompressibilityPhasicitySpontaneityPropertiesThrombus Aging +-----+---------------+---------+-----------+----------+--------------+ CFV  Full           Yes      Yes                                 +-----+---------------+---------+-----------+----------+--------------+   +---------+---------------+---------+-----------+----------+-------------------+ LEFT     CompressibilityPhasicitySpontaneityPropertiesThrombus Aging      +---------+---------------+---------+-----------+----------+-------------------+ CFV      Full           No       Yes        pulsatile                     +---------+---------------+---------+-----------+----------+-------------------+ SFJ      Full                                                             +---------+---------------+---------+-----------+----------+-------------------+ FV Prox  Full           Yes      Yes                                      +---------+---------------+---------+-----------+----------+-------------------+ FV Mid   Full           Yes      Yes                                      +---------+---------------+---------+-----------+----------+-------------------+ FV DistalFull           Yes      Yes  Not well visualized +---------+---------------+---------+-----------+----------+-------------------+ PFV      Full                                                             +---------+---------------+---------+-----------+----------+-------------------+ POP      Partial        Yes      Yes                  Acute               +---------+---------------+---------+-----------+----------+-------------------+ PTV      None           No       No                   Acute               +---------+---------------+---------+-----------+----------+-------------------+ PERO                                                   Not visualized      +---------+---------------+---------+-----------+----------+-------------------+ Gastroc  None           No       No                   Acute               +---------+---------------+---------+-----------+----------+-------------------+  Left Technical Findings: Not visualized segments include peroneal veins.   Summary: RIGHT: - No evidence of common femoral vein obstruction.  LEFT: - Findings consistent with acute deep vein thrombosis involving the left popliteal vein, left posterior tibial veins, and left gastrocnemius veins. - No cystic structure found in the popliteal fossa.  *See table(s) above for measurements and observations.    Preliminary    DG Chest Port 1 View  Result Date: 12/23/2022 CLINICAL DATA:  COPD. EXAM: PORTABLE CHEST 1 VIEW COMPARISON:  Chest radiograph dated 01/13/2019. FINDINGS: Shallow inspiration with bibasilar linear and platelike atelectasis or scarring. No focal consolidation, pleural effusion, or pneumothorax. Top-normal cardiac silhouette. No acute osseous pathology. IMPRESSION: No acute cardiopulmonary process. Electronically Signed   By: Elgie Collard M.D.   On: 12/23/2022 19:39      Scheduled Meds:  arformoterol  15 mcg Nebulization BID   budesonide (PULMICORT) nebulizer solution  0.25 mg Nebulization BID   Chlorhexidine Gluconate Cloth  6 each Topical Daily   ipratropium-albuterol  3 mL Nebulization TID   mouth rinse  15 mL Mouth Rinse 4 times per day   pantoprazole  40 mg Oral QAC breakfast   Continuous Infusions:  heparin 1,650 Units/hr (12/24/22 0910)     LOS: 1 day   Time spent: 35 minutes   Noralee Stain, DO [facility] 12/24/2022, 1:14 PM   Available via Epic secure chat 7am-7pm After these hours, please refer to coverage provider listed on [email or web address]

## 2022-12-25 ENCOUNTER — Ambulatory Visit (HOSPITAL_COMMUNITY): Payer: Medicaid Other

## 2022-12-25 ENCOUNTER — Encounter (HOSPITAL_COMMUNITY): Payer: Self-pay

## 2022-12-25 DIAGNOSIS — J9601 Acute respiratory failure with hypoxia: Secondary | ICD-10-CM | POA: Diagnosis not present

## 2022-12-25 LAB — BASIC METABOLIC PANEL
Anion gap: 7 (ref 5–15)
BUN: 15 mg/dL (ref 6–20)
CO2: 33 mmol/L — ABNORMAL HIGH (ref 22–32)
Calcium: 8.1 mg/dL — ABNORMAL LOW (ref 8.9–10.3)
Chloride: 96 mmol/L — ABNORMAL LOW (ref 98–111)
Creatinine, Ser: 0.96 mg/dL (ref 0.61–1.24)
GFR, Estimated: >60 mL/min (ref >60)
Glucose, Bld: 127 mg/dL — ABNORMAL HIGH (ref 70–99)
Potassium: 4.8 mmol/L (ref 3.5–5.1)
Sodium: 136 mmol/L (ref 135–145)

## 2022-12-25 LAB — BLOOD GAS, ARTERIAL
Acid-Base Excess: 8.2 mmol/L — ABNORMAL HIGH (ref 0.0–2.0)
Bicarbonate: 40.3 mmol/L — ABNORMAL HIGH (ref 20.0–28.0)
Delivery systems: POSITIVE
Drawn by: 33147
Expiratory PAP: 6 cmH2O
FIO2: 50 %
Inspiratory PAP: 14 cmH2O
O2 Saturation: 87.1 %
Patient temperature: 37
RATE: 12 resp/min
pCO2 arterial: 103 mmHg (ref 32–48)
pH, Arterial: 7.2 — ABNORMAL LOW (ref 7.35–7.45)
pO2, Arterial: 57 mmHg — ABNORMAL LOW (ref 83–108)

## 2022-12-25 LAB — CBC
HCT: 53.4 % — ABNORMAL HIGH (ref 39.0–52.0)
Hemoglobin: 15.3 g/dL (ref 13.0–17.0)
MCH: 26.9 pg (ref 26.0–34.0)
MCHC: 28.7 g/dL — ABNORMAL LOW (ref 30.0–36.0)
MCV: 93.8 fL (ref 80.0–100.0)
Platelets: 179 10*3/uL (ref 150–400)
RBC: 5.69 MIL/uL (ref 4.22–5.81)
RDW: 15.9 % — ABNORMAL HIGH (ref 11.5–15.5)
WBC: 13.9 10*3/uL — ABNORMAL HIGH (ref 4.0–10.5)
nRBC: 0.4 % — ABNORMAL HIGH (ref 0.0–0.2)

## 2022-12-25 LAB — CULTURE, BLOOD (ROUTINE X 2): Culture: NO GROWTH

## 2022-12-25 LAB — HEPARIN LEVEL (UNFRACTIONATED): Heparin Unfractionated: 0.54 IU/mL (ref 0.30–0.70)

## 2022-12-25 MED ORDER — BUDESONIDE 0.5 MG/2ML IN SUSP
0.5000 mg | Freq: Two times a day (BID) | RESPIRATORY_TRACT | Status: DC
  Administered 2022-12-25 – 2022-12-31 (×12): 0.5 mg via RESPIRATORY_TRACT
  Filled 2022-12-25 (×14): qty 2

## 2022-12-25 NOTE — Progress Notes (Signed)
PROGRESS NOTE  Knighton Geyman ZOX:[id number] DOB: [birth year] DOA: 12/23/2022 PCP: Fleet Contras, MD   LOS: 2 days   Brief Narrative / Interim history: Kurt [name] is a 62 y.o. male with medical history significant of depression, GERD, COPD, heart failure with preserved ejection fraction, pulmonary hypertension, obstructive sleep apnea who presents emergency department with progressively worsening shortness of breath.  Patient typically is on 3 L nasal cannula at home.  He states that over the last 3 weeks has noticed worsening shortness of breath and weight gain.  He is also had limited access to his breathing treatments.  He has noticed worsening swelling of his left leg.  He is supposed to have a ultrasound to assess for DVT in outpatient setting.  CT pulmonary embolism study demonstrated extensive pulm embolism in the right pulmonary artery with evidence of right heart strain.  In the emergency department, patient required up to 12 L high flow oxygen to maintain his saturations.  Case discussed with PCCM, they referred for [facility] admission.  Subjective / 24h Interval events: No complaints this morning, feels a little bit drowsy.  Assesement and Plan: Principal Problem:   Acute hypoxic respiratory failure (HCC) Active Problems:   OSA on CPAP   Essential hypertension   Morbid obesity due to excess calories (HCC)   GERD without esophagitis   Acute pulmonary embolism (HCC)   Cor pulmonale (HCC)   DVT (deep venous thrombosis) (HCC)   Demand ischemia   Principal problem Unprovoked PE and DVT -DVT ultrasound positive for left-sided DVT. Echocardiogram shows EF 70 to 75%, grade 1 diastolic dysfunction, elevated right ventricular pressure overload, consistent with cor pulmonale -Patient remains hemodynamically stable, continue IV heparin for now  Active problems Acute on chronic hypoxemic respiratory failure secondary to PE acute respiratory acidosis -Uses 3 L nasal cannula O2 at baseline,  currently on 8 L.  Continue to wean as able.  Patient was found on CPAP this morning, put does require BiPAP at home.  Given somnolence and ABG was done which showed acute hypercarbic respiratory acidosis with a pH of 7.2 and a pCO2 greater than 100.  Placed on BiPAP.  PCCM consulted   Demand ischemia -Secondary to above, troponin 43, 42   Hypertension -Holding home losartan, HCTZ, Lasix for hemodynamic support in setting of PE   GERD -PPI   OSA - BiPAP PRN for now as above   Obesity -BMI 41.65   Scheduled Meds:  arformoterol  15 mcg Nebulization BID   budesonide (PULMICORT) nebulizer solution  0.25 mg Nebulization BID   Chlorhexidine Gluconate Cloth  6 each Topical Daily   ipratropium-albuterol  3 mL Nebulization TID   mouth rinse  15 mL Mouth Rinse 4 times per day   pantoprazole  40 mg Oral QAC breakfast   Continuous Infusions:  heparin 1,650 Units/hr (12/25/22 0900)   PRN Meds:.mouth rinse  Current Outpatient Medications  Medication Instructions   albuterol (PROVENTIL) 2.5 mg, Nebulization, Every 6 hours PRN   aspirin EC 325 mg, Oral, Daily   Budeson-Glycopyrrol-Formoterol (BREZTRI AEROSPHERE) 160-9-4.8 MCG/ACT AERO 2 puffs, Inhalation, 2 times daily   cetirizine (ZYRTEC) 10 mg, Oral, As needed   Chlorphen-PE-Acetaminophen (NOREL AD) 4-10-325 MG TABS Oral, 2 times daily between meals and at bedtime PRN   famotidine (PEPCID) 20 MG tablet One after supper   fluticasone (FLONASE) 50 MCG/ACT nasal spray 1 spray, Each Nare, Daily   furosemide (LASIX) 40 mg, Oral, Daily   ibuprofen (ADVIL) 800 mg, Oral, 3 times daily  losartan-hydrochlorothiazide (HYZAAR) 50-12.5 MG tablet TAKE 1 TABLET BY MOUTH EVERY DAY   MITIGARE 0.6 MG CAPS 1 capsule, Oral, 2 times daily PRN   montelukast (SINGULAIR) 10 mg, Oral, Daily at bedtime   pantoprazole (PROTONIX) 40 mg, Oral, Daily before breakfast   potassium chloride (K-DUR) 10 MEQ tablet 10 mEq, Oral, Daily   tadalafil (CIALIS) 20 mg, Oral,  Daily PRN   tiZANidine (ZANAFLEX) 4 mg, Oral, Every 6 hours PRN   UNABLE TO FIND Med Name: CPAP with 3lpm o2 <BR>Adapt    Vitamin D (Ergocalciferol) (DRISDOL) 50,000 Units, Oral, Weekly    Diet Orders (From admission, onward)     Start     Ordered   12/24/22 0729  Diet Heart Room service appropriate? Yes; Fluid consistency: Thin  Diet effective now       Question Answer Comment  Room service appropriate? Yes   Fluid consistency: Thin      12/24/22 0728            DVT prophylaxis: Place TED hose Start: 12/23/22 2145   Lab Results  Component Value Date   PLT 179 12/25/2022      Code Status: Full Code  Family Communication: no family at bedside   Status is: Inpatient Remains inpatient appropriate because: severity of illness  Level of care: Stepdown  Consultants:  PCCM  Objective: Vitals:   12/25/22 0847 12/25/22 0900 12/25/22 0945 12/25/22 1141  BP:  (!) 140/88    Pulse:  97 96   Resp:  (!) 23 20   Temp:    98.4 F (36.9 C)  TempSrc:    Axillary  SpO2: 91% 97% (!) 86%   Weight:      Height:        Intake/Output Summary (Last 24 hours) at 12/25/2022 1211 Last data filed at 12/25/2022 0900 Gross per 24 hour  Intake 586.2 ml  Output 1800 ml  Net -1213.8 ml   Wt Readings from Last 3 Encounters:  12/23/22 118.8 kg  03/30/21 118.8 kg  05/01/20 124.6 kg    Examination:  Constitutional: NAD Eyes: no scleral icterus ENMT: Mucous membranes are moist.  Neck: normal, supple Respiratory: clear to auscultation bilaterally, no wheezing, no crackles. Normal respiratory effort. No accessory muscle use.  Cardiovascular: Regular rate and rhythm, no murmurs / rubs / gallops.  Abdomen: non distended, no tenderness. Bowel sounds positive.  Musculoskeletal: no clubbing / cyanosis.    Data Reviewed: I have independently reviewed following labs and imaging studies   CBC Recent Labs  Lab 12/23/22 1845 12/24/22 0400 12/25/22 0318  WBC 10.7* 8.7 13.9*  HGB  15.4 15.8 15.3  HCT 51.6 52.5* 53.4*  PLT 175 178 179  MCV 89.1 89.3 93.8  MCH 26.6 26.9 26.9  MCHC 29.8* 30.1 28.7*  RDW 15.9* 15.7* 15.9*    Recent Labs  Lab 12/23/22 1845 12/23/22 2209 12/25/22 0318  NA 132*  --  136  K 4.1  --  4.8  CL 92*  --  96*  CO2 32  --  33*  GLUCOSE 112*  --  127*  BUN 25*  --  15  CREATININE 1.25*  --  0.96  CALCIUM 8.0*  --  8.1*  LATICACIDVEN 1.6 1.2  --   BNP 417.8*  --   --     ------------------------------------------------------------------------------------------------------------------ No results for input(s): "CHOL", "HDL", "LDLCALC", "TRIG", "CHOLHDL", "LDLDIRECT" in the last 72 hours.  Lab Results  Component Value Date   HGBA1C 6.8 (H) 10/19/2009   ------------------------------------------------------------------------------------------------------------------  No results for input(s): "TSH", "T4TOTAL", "T3FREE", "THYROIDAB" in the last 72 hours.  Invalid input(s): "FREET3"  Cardiac Enzymes No results for input(s): "CKMB", "TROPONINI", "MYOGLOBIN" in the last 168 hours.  Invalid input(s): "CK" ------------------------------------------------------------------------------------------------------------------    Component Value Date/Time   BNP 417.8 (H) 12/23/2022 1845    CBG: No results for input(s): "GLUCAP" in the last 168 hours.  Recent Results (from the past 240 hour(s))  Blood culture (routine x 2)     Status: None (Preliminary result)   Collection Time: 12/23/22  6:45 PM   Specimen: BLOOD RIGHT FOREARM  Result Value Ref Range Status   Specimen Description   Final    BLOOD RIGHT FOREARM Performed at [facility], [address] [place], [place] [address]    Special Requests   Final    BOTTLES DRAWN AEROBIC AND ANAEROBIC Blood Culture results may not be optimal due to an excessive volume of blood received in culture bottles Performed at [facility], [address]  [place], [place] [address]    Culture   Final    NO GROWTH 2 DAYS Performed at [facility], [address] [place], [place] [address]    Report Status PENDING  Incomplete  Blood culture (routine x 2)     Status: None (Preliminary result)   Collection Time: 12/23/22  6:58 PM   Specimen: BLOOD RIGHT HAND  Result Value Ref Range Status   Specimen Description   Final    BLOOD RIGHT HAND Performed at [facility], [address], [place], [place] [address]    Special Requests   Final    BOTTLES DRAWN AEROBIC AND ANAEROBIC Blood Culture results may not be optimal due to an excessive volume of blood received in culture bottles Performed at [facility], [address] [place], [place] [address]    Culture   Final    NO GROWTH 2 DAYS Performed at [facility], [address], [place], [place] [address]    Report Status PENDING  Incomplete  MRSA Next Gen by PCR, Nasal     Status: None   Collection Time: 12/24/22  9:16 AM   Specimen: Nasal Mucosa; Nasal Swab  Result Value Ref Range Status   MRSA by PCR Next Gen NOT DETECTED NOT DETECTED Final    Comment: (NOTE) The GeneXpert MRSA Assay (FDA approved for NASAL specimens only), is one component of a comprehensive MRSA colonization surveillance program. It is not intended to diagnose MRSA infection nor to guide or monitor treatment for MRSA infections. Test performance is not FDA approved in patients less than 93 years old. Performed at [facility], [address], [place], [place] [address]      Radiology Studies: No results found.   Pamella Pert, MD, PhD [facility]  Between 7 am - 7 pm I am available, please contact me via Amion (for emergencies) or Securechat (non urgent messages)  Between 7 pm - 7 am I am not available, please contact night coverage MD/APP via Amion

## 2022-12-25 NOTE — Progress Notes (Signed)
RT placed pt on BIPAP due to AMS and low sats.

## 2022-12-25 NOTE — Progress Notes (Signed)
   12/25/22 2258  BiPAP/CPAP/SIPAP  BiPAP/CPAP/SIPAP Pt Type Adult  BiPAP/CPAP/SIPAP V60  Mask Type Full face mask  Mask Size Large  Set Rate 20 breaths/min  Respiratory Rate 24 breaths/min  IPAP 22 cmH20  EPAP 8 cmH2O  FiO2 (%) (S)  35 % (Titrating per order)  Minute Ventilation 14  Leak 00  Peak Inspiratory Pressure (PIP) 22  Tidal Volume (Vt) 350  Press High Alarm 25 cmH2O  Press Low Alarm 5 cmH2O  CPAP/SIPAP surface wiped down Yes   Pt. Placed on BiPAP V-60 for h/s per order, tolerating well, RT/Staff to monitor.

## 2022-12-25 NOTE — Progress Notes (Signed)
ANTICOAGULATION CONSULT NOTE  Pharmacy Consult for heparin Indication: acute R LE DVT and PE  Allergies  Allergen Reactions   Penicillins Itching    Has patient had a PCN reaction causing immediate rash, facial/tongue/throat swelling, SOB or lightheadedness with hypotension: No Has patient had a PCN reaction causing severe rash involving mucus membranes or skin necrosis: No Has patient had a PCN reaction that required hospitalization: No Has patient had a PCN reaction occurring within the last 10 years: No If all of the above answers are "NO", then may proceed with Cephalosporin use.    Patient Measurements: Height: 5' 6.5" (168.9 cm) Weight: 118.8 kg (262 lb) IBW/kg (Calculated) : 64.95 Heparin Dosing Weight: 92.5 kg  Vital Signs: Temp: 98.1 F (36.7 C) (06/19 0755) Temp Source: Axillary (06/19 0755) BP: 140/88 (06/19 0900) Pulse Rate: 96 (06/19 0945)  Labs: Recent Labs    12/23/22 1845 12/23/22 1845 12/23/22 2209 12/24/22 0400 12/24/22 0446 12/24/22 1126 12/25/22 0318  HGB 15.4  --   --  15.8  --   --  15.3  HCT 51.6  --   --  52.5*  --   --  53.4*  PLT 175  --   --  178  --   --  179  HEPARINUNFRC  --    < >  --  0.92* 0.60 0.62 0.54  CREATININE 1.25*  --   --   --   --   --  0.96  TROPONINIHS 43*  --  42*  --   --   --   --    < > = values in this interval not displayed.     Estimated Creatinine Clearance: 100.1 mL/min (by C-G formula based on SCr of 0.96 mg/dL).   Medical History: Past Medical History:  Diagnosis Date   Anxiety    Arthritis    "hands, knees, joints, lower back" (06/20/2017)   Borderline diabetes    Chronic bronchitis (HCC)    COPD (chronic obstructive pulmonary disease) (HCC)    dx'd 06/20/2017   Depression    GERD (gastroesophageal reflux disease)    Gout    Hypertension    OSA on CPAP    Assessment: 62 yo M with acute R LE DVT & extensive PE. CT evidence of right heart strain. Pharmacy consulted to manage heparin.  No  anticoagulants PTA. CBC WNL.  12/25/2022: Heparin level is 0.54,  therapeutic on heparin infusion at 1650 units/hr CBC WNL No bleeding or infusion related concerns noted per RN   Goal of Therapy:  Heparin level 0.3-0.7 units/ml Monitor platelets by anticoagulation protocol: Yes   Plan:  -Continue Heparin infusion at 1650 units/hr -Daily heparin level & CBC while on heparin -F/U long-term anticoagulation plans   Adalberto Cole, PharmD, BCPS 12/25/2022 10:04 AM

## 2022-12-25 NOTE — Consult Note (Signed)
NAME:  Rodeny Deuschle, MRN:  [id number], DOB:  June 28, 1961, LOS: 2 ADMISSION DATE:  12/23/2022, CONSULTATION DATE:  12/25/2022 REFERRING MD:  Dr. Elvera Lennox, CHIEF COMPLAINT:  SOB   History of Present Illness:   62 year old male with PMH as below significant for OSA/ OHS,  chronic hypoxic respiratory failure on HOT 3L, chronic bronchitis, HTN, GERD, and obesity who presented 6/17 with complaints of SOB and chest pain.  Limited HPI given patient is currently somnolent on BiPAP with full face mask, therefore obtained per EMR review.  Reported not having access to his home O2, nebs, or BiPAP for 2 weeks as patient is in between living arrangements/ moving with increasing SOB and non productive cough, and one day history of chest pain; additionally had LLE edema but unable to go for his ultrasound due to transportation issues.  Noted to by hypoxic at 78% on room air initially, requiring up to 12L salter HFNC.  Workup positive for LLE DVT and CTA PE for acute submassive PE w/RV/ LV ratio 2.3, lactic 1.6>1.2, BNP 417, trop hs 43, EKG without ischemic changes.  He was admitted to [facility] and started on heparin drip, otherwise hemodynamically stable.  TTE showed EF 70-75%, no RWA, G1DD, mildly reduced RVSF, RVSP 68.6, findings c/w possibly acute on chronic cor pulmonale.  Patient had been using home machine, CPAP and became progressively encephalopathic on 6/19 am.  ABG showed 7.2/ 103/ 57/ 40.3 therefore placed on BiPAP.  Patient remains somnolent on BIPAP therefore PCCM consulted for further recs.   Of note, last seen in clinic by Dr. Craige Cotta on 03/2021, at that time was compliant with home BiPAP (using Adapt) 12/8, and changing over to breztri in place of symbicort and adding singulair.    PFT 11/19/17 >> FEV1 1.96 (70%), FEV1% 82, TLC 4.32 (69%), DLCO 96% PSG 08/03/17 >> AHI 108.1   Pertinent  Medical History  OSA/ OHS, chronic hypoxic respiratory failure on HOT 3L, chronic bronchitis, former smoker (quit 2008), HTN,  GERD, OA, anxiety/ depression, gout, obesity  Significant Hospital Events: Including procedures, antibiotic start and stop dates in addition to other pertinent events   6/17 admitted   Interim History / Subjective:  RT just increased from 18/6 w/TV ~500 to 22/8 with average TV's 550 Pt nods no to current chest pain or SOB or nausea  Objective   Blood pressure (!) 140/88, pulse 96, temperature 98.1 F (36.7 C), temperature source Axillary, resp. rate 20, height 5' 6.5" (1.689 m), weight 118.8 kg, SpO2 (!) 86 %.    FiO2 (%):  [30 %-50 %] 35 %   Intake/Output Summary (Last 24 hours) at 12/25/2022 1122 Last data filed at 12/25/2022 0900 Gross per 24 hour  Intake 875.13 ml  Output 1800 ml  Net -924.87 ml   Filed Weights   12/23/22 1744  Weight: 118.8 kg   Examination: General:  Older obese adult male sitting upright in bed on NIV in NAD HEENT: full face mask, pupils 3/r Neuro: Awakens to verbal, follows commands in all extremities, nods appropriately, somnolent  CV: rr, NSR, no murmur PULM:  non labored on NIV 22/8, 35%, sats 95%, clear and diminished GI: obese, +bs, soft Extremities: warm/dry, +1 LE, L> R edema, chronic venous stasis changes  Skin: no rashes  Labs reviewed   CTA PE>  1. Extensive pulmonary embolus involving the right pulmonary artery and distal left/interlobar pulmonary artery, and numerous bilateral proximal segmental/subsegmental branches. 2. CT evidence of right heart strain (RV to  LV ratio of 2.3). 3. Elevation of the right hemidiaphragm with adjacent atelectasis. 4. Coronary artery calcifications and aortic atherosclerosis (ICD10-I70.0).  Resolved Hospital Problem list    Assessment & Plan:   Acute on chronic hypoxic and hypercarbic respiratory failure 2/2 untreated OHS/ OSA with acute submassive PE with cor pulmonale Acute LLE DVT Hx chronic bronchitis  Obesity- BMI 41.6 kg/m2 HTN GERD - pt has not had access recently to home nebs, meds, machine  given living situation/ moving P:  - currently protecting airway at this time.  Remains intubation risk - continue BiPAP for now, settings adjusted.  Will need time for hypercarbia to clear.  Mental status slowly improving, awakens to verbal but remains somnolent.  Could try AVAPs if worsening mental status to avoid intubation which would be higher risk for cardiac compromise with cor pulmonale.  Once hypercarbia cleared, would use BiPAP with naps and mandatory at bedtime. - wean FiO2 for goal sat 88-94% - pt had been using CPAP machine brought from home but was using NIV in last clinic visit 2022 based on downloads.  Will need to ensure patient has correct NIV/ equipment and pulmonary meds/ nebs at home prior to discharge.  Should not need re-qualification.   - cont NPO till hypercarbia cleared/ mental status improved - avoid sedating meds - cont IV heparin per pharmacy, likely to transition to DOAC after 48hrs of therapy - could consider IR evaluation, but suspect component of chronic on acute cor pulmonale, will discuss further with attending.  PASP 68, will need repeat outpt TTE in follow-up.  - cont brovana and pulmicort, prn duonebs w/ aggressive pulmonary hygiene IS/ flutter - PPI - will need to clarify risk factors of his VTE, presumed unprovoked DVT for now, r/o sedentary lifestyle, malignancy screening, consider hypercoagulable workup - agree with holding home HTN meds for now  Remainder per primary team.  PCCM continue to follow.   Best Practice (right click and "Reselect all SmartList Selections" daily)  Per Primary team   Labs   CBC: Recent Labs  Lab 12/23/22 1845 12/24/22 0400 12/25/22 0318  WBC 10.7* 8.7 13.9*  HGB 15.4 15.8 15.3  HCT 51.6 52.5* 53.4*  MCV 89.1 89.3 93.8  PLT 175 178 179    Basic Metabolic Panel: Recent Labs  Lab 12/23/22 1845 12/25/22 0318  NA 132* 136  K 4.1 4.8  CL 92* 96*  CO2 32 33*  GLUCOSE 112* 127*  BUN 25* 15  CREATININE 1.25* 0.96   CALCIUM 8.0* 8.1*   GFR: Estimated Creatinine Clearance: 100.1 mL/min (by C-G formula based on SCr of 0.96 mg/dL). Recent Labs  Lab 12/23/22 1845 12/23/22 2209 12/24/22 0400 12/25/22 0318  WBC 10.7*  --  8.7 13.9*  LATICACIDVEN 1.6 1.2  --   --     Liver Function Tests: No results for input(s): "AST", "ALT", "ALKPHOS", "BILITOT", "PROT", "ALBUMIN" in the last 168 hours. No results for input(s): "LIPASE", "AMYLASE" in the last 168 hours. No results for input(s): "AMMONIA" in the last 168 hours.  ABG    Component Value Date/Time   PHART 7.2 (L) 12/25/2022 0852   PCO2ART 103 (HH) 12/25/2022 0852   PO2ART 57 (L) 12/25/2022 0852   HCO3 40.3 (H) 12/25/2022 0852   TCO2 31 01/19/2014 1229   O2SAT 87.1 12/25/2022 0852     Coagulation Profile: No results for input(s): "INR", "PROTIME" in the last 168 hours.  Cardiac Enzymes: No results for input(s): "CKTOTAL", "CKMB", "CKMBINDEX", "TROPONINI" in the last 168  hours.  HbA1C: Hgb A1c MFr Bld  Date/Time Value Ref Range Status  10/19/2009 10:46 PM 6.8 (H) <5.7 % Final    Comment:    See lab report for associated comment(s)    CBG: No results for input(s): "GLUCAP" in the last 168 hours.  Review of Systems:   Unable given BiPAP/ mental status  Past Medical History:  He,  has a past medical history of Anxiety, Arthritis, Borderline diabetes, Chronic bronchitis (HCC), COPD (chronic obstructive pulmonary disease) (HCC), Depression, GERD (gastroesophageal reflux disease), Gout, Hypertension, and OSA on CPAP.   Surgical History:   Past Surgical History:  Procedure Laterality Date   NO PAST SURGERIES       Social History:   reports that he quit smoking about 16 years ago. His smoking use included cigarettes. He has a 30.00 pack-year smoking history. He has never used smokeless tobacco. He reports that he does not drink alcohol and does not use drugs.   Family History:  His family history includes Hypertension in his  mother.   Allergies Allergies  Allergen Reactions   Penicillins Itching    Has patient had a PCN reaction causing immediate rash, facial/tongue/throat swelling, SOB or lightheadedness with hypotension: No Has patient had a PCN reaction causing severe rash involving mucus membranes or skin necrosis: No Has patient had a PCN reaction that required hospitalization: No Has patient had a PCN reaction occurring within the last 10 years: No If all of the above answers are "NO", then may proceed with Cephalosporin use.     Home Medications  Prior to Admission medications   Medication Sig Start Date End Date Taking? Authorizing Provider  albuterol (PROVENTIL) (2.5 MG/3ML) 0.083% nebulizer solution Take 3 mLs (2.5 mg total) by nebulization every 6 (six) hours as needed for wheezing or shortness of breath. 03/30/21  Yes Coralyn Helling, MD  aspirin EC 325 MG tablet Take 325 mg by mouth daily.   Yes [provider]  cetirizine (ZYRTEC) 10 MG tablet Take 10 mg by mouth as needed. 12/30/17  Yes [provider]  famotidine (PEPCID) 20 MG tablet One after supper 01/13/19  Yes Nyoka Cowden, MD  fluticasone [facility]) 50 MCG/ACT nasal spray Place 1 spray into both nostrils daily for 14 days. 12/23/17 12/23/22 Yes Lucia Estelle, NP  furosemide (LASIX) 40 MG tablet Take 40 mg by mouth daily.   Yes [provider]  ibuprofen (ADVIL,MOTRIN) 800 MG tablet Take 800 mg by mouth 3 (three) times daily. 02/27/18  Yes [provider]  losartan-hydrochlorothiazide (HYZAAR) 50-12.5 MG tablet TAKE 1 TABLET BY MOUTH EVERY DAY 04/12/19  Yes Nyoka Cowden, MD  montelukast (SINGULAIR) 10 MG tablet Take 1 tablet (10 mg total) by mouth at bedtime. 03/30/21  Yes Coralyn Helling, MD  potassium chloride (K-DUR) 10 MEQ tablet Take 10 mEq by mouth daily. 06/11/17  Yes [provider]  tadalafil (CIALIS) 20 MG tablet Take 20 mg by mouth daily as needed for erectile dysfunction. 12/09/22  Yes [provider]  tiZANidine (ZANAFLEX) 4 MG tablet Take 4 mg by mouth every 6 (six) hours as needed for muscle spasms.   Yes [provider]  Vitamin D, Ergocalciferol, (DRISDOL) 50000 units CAPS capsule Take 50,000 Units by mouth once a week. 06/18/17  Yes [provider]  Budeson-Glycopyrrol-Formoterol (BREZTRI AEROSPHERE) 160-9-4.8 MCG/ACT AERO Inhale 2 puffs into the lungs in the morning and at bedtime. 03/30/21   Coralyn Helling, MD  Chlorphen-PE-Acetaminophen (NOREL AD) 4-10-325 MG TABS  Take by mouth 3 times/day as needed-between meals & bedtime.    [provider]  MITIGARE 0.6 MG CAPS Take 1 capsule by mouth 2 (two) times daily as needed. 03/10/19   [provider]  pantoprazole (PROTONIX) 40 MG tablet Take 40 mg by mouth daily before breakfast.    [provider]  UNABLE TO FIND Med Name: CPAP with 3lpm o2  Adapt    [provider]     Critical care time: 45 mins      Posey Boyer, MSN, NP, AG-ACNP-BC [facility] 12/25/2022, 11:22 AM  See Amion for pager If no response to pager , please call [phone] until 7pm After 7:00 pm call Elink  [phone]

## 2022-12-25 NOTE — TOC Initial Note (Signed)
Transition of Care [facility]) - Initial/Assessment Note    Patient Details  Name: Kurt [name] MRN: [id number] Date of Birth: February 16, 1961  Transition of [facility]) CM/SW Contact:    Howell Rucks, RN Phone Number: 12/25/2022, 1:11 PM  Clinical Narrative: P remains in ICU, per MD progress notes, pt on Bipap with somnolence. NCM call to pt's dtr on file (Kurt [name]), phone number listed not in service, call tp pt's mother  Kurt [name]), no answer. TOC will continue to follow and contact when respiratory status more stable.                        Patient Goals and CMS Choice            Expected Discharge Plan and Services                                              Prior Living Arrangements/Services                       Activities of Daily Living Home Assistive Devices/Equipment: Cane (specify quad or straight), BIPAP, CBG Meter, Eyeglasses, Nebulizer ADL Screening (condition at time of admission) Patient's cognitive ability adequate to safely complete daily activities?: Yes Is the patient deaf or have difficulty hearing?: No Does the patient have difficulty seeing, even when wearing glasses/contacts?: Yes Does the patient have difficulty concentrating, remembering, or making decisions?: No Patient able to express need for assistance with ADLs?: Yes Does the patient have difficulty dressing or bathing?: No Independently performs ADLs?: Yes (appropriate for developmental age) Does the patient have difficulty walking or climbing stairs?: Yes Weakness of Legs: Both Weakness of Arms/Hands: None  Permission Sought/Granted                  Emotional Assessment              Admission diagnosis:  Hyponatremia [E87.1] Elevated troponin [R79.89] Elevated brain natriuretic peptide (BNP) level [R79.89] Acute deep vein thrombosis (DVT) of proximal vein of left lower extremity (HCC) [I82.4Y2] Other acute pulmonary embolism with acute cor  pulmonale (HCC) [I26.09] Acute hypoxic respiratory failure (HCC) [J96.01] Acute on chronic hypoxic respiratory failure (HCC) [J96.21] Patient Active Problem List   Diagnosis Date Noted   Acute pulmonary embolism (HCC) 12/24/2022   Cor pulmonale (HCC) 12/24/2022   DVT (deep venous thrombosis) (HCC) 12/24/2022   Demand ischemia 12/24/2022   Acute hypoxic respiratory failure (HCC) 12/23/2022   GERD without esophagitis 07/27/2019   Cough variant asthma 01/14/2019   Chronic respiratory failure with hypoxia and hypercapnia (HCC) 09/04/2017   Morbid obesity due to excess calories (HCC) 09/04/2017   Dyspnea on exertion 09/03/2017   Conjunctivitis 06/20/2017   Acute on chronic respiratory failure with hypoxia (HCC) 06/20/2017   COPD with acute exacerbation (HCC) 06/20/2017   Bilateral leg edema 06/20/2017   Acute respiratory failure with hypoxia (HCC) 06/20/2017   OSA on CPAP    Essential hypertension    Diabetes mellitus without complication (HCC)    PCP:  Fleet Contras, MD Pharmacy:   [facility] [phone] - [place], [place] - [address] AT [address] [place] [address] Phone: [phone] Fax: [phone]     Social Determinants of Health (SDOH) Social History: SDOH Screenings   Food Insecurity: No Food Insecurity (12/24/2022)  Housing:  Patient Declined (12/24/2022)  Transportation Needs: Unmet Transportation Needs (12/24/2022)  Utilities: Not At Risk (12/24/2022)  Tobacco Use: Medium Risk (12/23/2022)   SDOH Interventions:     Readmission Risk Interventions     No data to display

## 2022-12-25 NOTE — Progress Notes (Signed)
   12/25/22 0330  BiPAP/CPAP/SIPAP  BiPAP/CPAP/SIPAP Pt Type Adult  BiPAP/CPAP/SIPAP Resmed (Humidifier refilled with S/W)   P-104/RR-24, Pt. remains awake watching TV, [place] [facility]/Home BiPAP humidifier refilled also, has double flowmeter in place and able to place self on/off independently

## 2022-12-25 NOTE — Progress Notes (Addendum)
Rt changed BIPAP settings due to ABG. 18/6 35%

## 2022-12-26 ENCOUNTER — Inpatient Hospital Stay (HOSPITAL_COMMUNITY): Payer: Medicaid Other

## 2022-12-26 DIAGNOSIS — J9601 Acute respiratory failure with hypoxia: Secondary | ICD-10-CM | POA: Diagnosis not present

## 2022-12-26 LAB — CBC
HCT: 56.1 % — ABNORMAL HIGH (ref 39.0–52.0)
Hemoglobin: 15.6 g/dL (ref 13.0–17.0)
MCH: 27.2 pg (ref 26.0–34.0)
MCHC: 27.8 g/dL — ABNORMAL LOW (ref 30.0–36.0)
MCV: 97.9 fL (ref 80.0–100.0)
Platelets: 168 10*3/uL (ref 150–400)
RBC: 5.73 MIL/uL (ref 4.22–5.81)
RDW: 16.9 % — ABNORMAL HIGH (ref 11.5–15.5)
WBC: 10.9 10*3/uL — ABNORMAL HIGH (ref 4.0–10.5)
nRBC: 0.6 % — ABNORMAL HIGH (ref 0.0–0.2)

## 2022-12-26 LAB — CULTURE, BLOOD (ROUTINE X 2): Culture: NO GROWTH

## 2022-12-26 LAB — COMPREHENSIVE METABOLIC PANEL
ALT: 49 U/L — ABNORMAL HIGH (ref 0–44)
AST: 27 U/L (ref 15–41)
Albumin: 3 g/dL — ABNORMAL LOW (ref 3.5–5.0)
Alkaline Phosphatase: 72 U/L (ref 38–126)
Anion gap: 8 (ref 5–15)
BUN: 12 mg/dL (ref 6–20)
CO2: 34 mmol/L — ABNORMAL HIGH (ref 22–32)
Calcium: 8.4 mg/dL — ABNORMAL LOW (ref 8.9–10.3)
Chloride: 95 mmol/L — ABNORMAL LOW (ref 98–111)
Creatinine, Ser: 0.94 mg/dL (ref 0.61–1.24)
GFR, Estimated: >60 mL/min (ref >60)
Glucose, Bld: 117 mg/dL — ABNORMAL HIGH (ref 70–99)
Potassium: 5.4 mmol/L — ABNORMAL HIGH (ref 3.5–5.1)
Sodium: 137 mmol/L (ref 135–145)
Total Bilirubin: 0.7 mg/dL (ref 0.3–1.2)
Total Protein: 6.8 g/dL (ref 6.5–8.1)

## 2022-12-26 LAB — HEPARIN LEVEL (UNFRACTIONATED)
Heparin Unfractionated: >1.1 IU/mL — ABNORMAL HIGH (ref 0.30–0.70)
Heparin Unfractionated: 0.86 IU/mL — ABNORMAL HIGH (ref 0.30–0.70)
Heparin Unfractionated: 0.54 IU/mL (ref 0.30–0.70)

## 2022-12-26 LAB — MAGNESIUM: Magnesium: 2.3 mg/dL (ref 1.7–2.4)

## 2022-12-26 MED ORDER — SODIUM ZIRCONIUM CYCLOSILICATE 10 G PO PACK
10.0000 g | PACK | Freq: Once | ORAL | Status: AC
  Administered 2022-12-26: 10 g via ORAL
  Filled 2022-12-26: qty 1

## 2022-12-26 MED ORDER — FUROSEMIDE 10 MG/ML IJ SOLN
40.0000 mg | Freq: Once | INTRAMUSCULAR | Status: AC
  Administered 2022-12-26: 40 mg via INTRAVENOUS
  Filled 2022-12-26: qty 4

## 2022-12-26 MED ORDER — HEPARIN (PORCINE) 25000 UT/250ML-% IV SOLN
1250.0000 [IU]/h | INTRAVENOUS | Status: AC
  Administered 2022-12-27: 1250 [IU]/h via INTRAVENOUS
  Filled 2022-12-26: qty 250

## 2022-12-26 MED ORDER — HEPARIN (PORCINE) 25000 UT/250ML-% IV SOLN
1400.0000 [IU]/h | INTRAVENOUS | Status: DC
  Administered 2022-12-26: 1400 [IU]/h via INTRAVENOUS
  Filled 2022-12-26: qty 250

## 2022-12-26 NOTE — Progress Notes (Signed)
ANTICOAGULATION CONSULT NOTE  Pharmacy Consult for heparin Indication: acute R LE DVT and PE  Allergies  Allergen Reactions   Penicillins Itching    Has patient had a PCN reaction causing immediate rash, facial/tongue/throat swelling, SOB or lightheadedness with hypotension: No Has patient had a PCN reaction causing severe rash involving mucus membranes or skin necrosis: No Has patient had a PCN reaction that required hospitalization: No Has patient had a PCN reaction occurring within the last 10 years: No If all of the above answers are "NO", then may proceed with Cephalosporin use.    Patient Measurements: Height: 5' 6.5" (168.9 cm) Weight: 118.8 kg (262 lb) IBW/kg (Calculated) : 64.95 Heparin Dosing Weight: 92.5 kg  Vital Signs: Temp: 98 F (36.7 C) (06/20 1945) Temp Source: Oral (06/20 1945) BP: 115/58 (06/20 2000) Pulse Rate: 106 (06/20 2000)  Labs: Recent Labs    12/24/22 0400 12/24/22 0446 12/25/22 0318 12/26/22 0322 12/26/22 1345 12/26/22 2109  HGB 15.8  --  15.3 15.6  --   --   HCT 52.5*  --  53.4* 56.1*  --   --   PLT 178  --  179 168  --   --   HEPARINUNFRC 0.92*   < > 0.54 >1.10* 0.86* 0.54  CREATININE  --   --  0.96 0.94  --   --    < > = values in this interval not displayed.     Estimated Creatinine Clearance: 102.2 mL/min (by C-G formula based on SCr of 0.94 mg/dL).   Medical History: Past Medical History:  Diagnosis Date   Anxiety    Arthritis    "hands, knees, joints, lower back" (06/20/2017)   Borderline diabetes    Chronic bronchitis (HCC)    COPD (chronic obstructive pulmonary disease) (HCC)    dx'd 06/20/2017   Depression    GERD (gastroesophageal reflux disease)    Gout    Hypertension    OSA on CPAP    Assessment: 62 yo M with acute R LE DVT & extensive PE. CT evidence of right heart strain. Pharmacy consulted to manage heparin.  No anticoagulants PTA.  12/26/2022: 13:45 heparin level supra-therapeutic with IV heparin  currently infusing at 1400 units/hr CBC WNL No bleeding or infusion related concerns noted per RN   Evening followup: 21:09 Heparin level = 0.54 (therapeutic) with heparin gtt @ 1250 units/hr No complications of therapy noted  Goal of Therapy:  Heparin level 0.3-0.7 units/ml Monitor platelets by anticoagulation protocol: Yes   Plan:  - Continue IV heparin @ 1250 units/hr - Recheck heparin level with AM labs to confirm therapeutic dose - Daily heparin level & CBC while on heparin - F/U long-term anticoagulation plans   Thank you for allowing pharmacy to be a part of this patient's care.  Terrilee Files, PharmD 12/26/2022 10:36 PM

## 2022-12-26 NOTE — Plan of Care (Signed)

## 2022-12-26 NOTE — TOC Progression Note (Addendum)
Transition of Care [facility]) - Progression Note    Patient Details  Name: Kurt [name] MRN: [id number] Date of Birth: [birth year]  Transition of Care [facility]) CM/SW Contact  Howell Rucks, RN Phone Number: 12/26/2022, 9:51 AM  Clinical Narrative:  PT eval pending, await recommendation   -12:39 Met with pt at bedside to introduce role of TOC/NCM and review for dc planning, pt dowsy during interview but consented,  Pt reports he has a PCP and pharmacy in place, no home services in place, uses a cane for functional mobility, pt reports he plans to drive himself home at discharge. Pt had no questions/concerns. TOC will continue to follow for transportation needs            Expected Discharge Plan and Services                                               Social Determinants of Health (SDOH) Interventions SDOH Screenings   Food Insecurity: No Food Insecurity (12/24/2022)  Housing: Patient Declined (12/24/2022)  Transportation Needs: Unmet Transportation Needs (12/24/2022)  Utilities: Not At Risk (12/24/2022)  Tobacco Use: Medium Risk (12/23/2022)    Readmission Risk Interventions     No data to display

## 2022-12-26 NOTE — Progress Notes (Signed)
ANTICOAGULATION CONSULT NOTE  Pharmacy Consult for heparin Indication: acute R LE DVT and PE  Allergies  Allergen Reactions   Penicillins Itching    Has patient had a PCN reaction causing immediate rash, facial/tongue/throat swelling, SOB or lightheadedness with hypotension: No Has patient had a PCN reaction causing severe rash involving mucus membranes or skin necrosis: No Has patient had a PCN reaction that required hospitalization: No Has patient had a PCN reaction occurring within the last 10 years: No If all of the above answers are "NO", then may proceed with Cephalosporin use.    Patient Measurements: Height: 5' 6.5" (168.9 cm) Weight: 118.8 kg (262 lb) IBW/kg (Calculated) : 64.95 Heparin Dosing Weight: 92.5 kg  Vital Signs: Temp: 98.5 F (36.9 C) (06/20 0239) Temp Source: Oral (06/20 0239) BP: 117/73 (06/20 0400) Pulse Rate: 115 (06/20 0400)  Labs: Recent Labs    12/23/22 1845 12/23/22 2209 12/24/22 0400 12/24/22 0446 12/24/22 1126 12/25/22 0318 12/26/22 0322  HGB 15.4  --  15.8  --   --  15.3 15.6  HCT 51.6  --  52.5*  --   --  53.4* 56.1*  PLT 175  --  178  --   --  179 168  HEPARINUNFRC  --   --  0.92*   < > 0.62 0.54 >1.10*  CREATININE 1.25*  --   --   --   --  0.96 0.94  TROPONINIHS 43* 42*  --   --   --   --   --    < > = values in this interval not displayed.     Estimated Creatinine Clearance: 102.2 mL/min (by C-G formula based on SCr of 0.94 mg/dL).   Medical History: Past Medical History:  Diagnosis Date   Anxiety    Arthritis    "hands, knees, joints, lower back" (06/20/2017)   Borderline diabetes    Chronic bronchitis (HCC)    COPD (chronic obstructive pulmonary disease) (HCC)    dx'd 06/20/2017   Depression    GERD (gastroesophageal reflux disease)    Gout    Hypertension    OSA on CPAP    Assessment: 62 yo M with acute R LE DVT & extensive PE. CT evidence of right heart strain. Pharmacy consulted to manage heparin.  No  anticoagulants PTA. CBC WNL.  12/26/2022: Heparin level >1.1,  now supra-therapeutic on heparin infusion at 1650 units/hr.  Verified lab drawn peripherally from arm opposite heparin infusion.  CBC WNL No bleeding or infusion related concerns noted per RN   Goal of Therapy:  Heparin level 0.3-0.7 units/ml Monitor platelets by anticoagulation protocol: Yes   Plan:  -Hold heparin x1 hour then resume at lower infusion rate of 1400 units/hr - Recheck heparin level at 1400 -Daily heparin level & CBC while on heparin -F/U long-term anticoagulation plans  Junita Push, PharmD, BCPS 12/26/2022 4:39 AM

## 2022-12-26 NOTE — Evaluation (Signed)
Physical Therapy Evaluation Patient Details Name: Kurt [name] MRN: [id number] DOB: July 12, 1960 Today's Date: 12/26/2022  History of Present Illness  Kurt [name] is a 62 y.o. male with medical history significant of depression, GERD, COPD, heart failure with preserved ejection fraction, pulmonary hypertension, obstructive sleep apnea who presents emergency department 6/17 /24with progressively worsening shortness of breath.CT pulmonary embolism study demonstrated extensive pulm embolism in the right pulmonary artery with evidence of right heart strain, LLE DVT.  Clinical Impression  Pt admitted with above diagnosis.  Pt currently with functional limitations due to the deficits listed below (see PT Problem List). Pt will benefit from acute skilled PT to increase their independence and safety with mobility to allow discharge.    The patient is seated in recliner, dozing off frequently. Patient  mobilized from recliner to bed with min guard for lines and tubes. Patient presents with decreased endurance   that impacts  ADL. Continue PT for progressive activity.  Patient maintained on 50% venti mask, Spo2 at rest 93%, dropped to 85%, Staying at ~ 87% after return to supine. HR maintained in 100's.        Recommendations for follow up therapy are one component of a multi-disciplinary discharge planning process, led by the attending physician.  Recommendations may be updated based on patient status, additional functional criteria and insurance authorization.  Follow Up Recommendations       Assistance Recommended at Discharge PRN  Patient can return home with the following  A little help with bathing/dressing/bathroom;Help with stairs or ramp for entrance;Assist for transportation    Equipment Recommendations None recommended by PT  Recommendations for Other Services       Functional Status Assessment Patient has had a recent decline in their functional status and demonstrates the ability to  make significant improvements in function in a reasonable and predictable amount of time.     Precautions / Restrictions Precautions Precautions: Fall Precaution Comments: monitor sats, HR Restrictions Weight Bearing Restrictions: No      Mobility  Bed Mobility Overal bed mobility: Needs Assistance Bed Mobility: Sit to Supine       Sit to supine: Supervision        Transfers Overall transfer level: Needs assistance Equipment used: None Transfers: Sit to/from Stand, Bed to chair/wheelchair/BSC Sit to Stand: Min guard   Step pivot transfers: Min guard       General transfer comment: assist with lines and tubes, stepped to  bed from recliner    Ambulation/Gait                  Stairs            Wheelchair Mobility    Modified Rankin (Stroke Patients Only)       Balance Overall balance assessment: Mild deficits observed, not formally tested                                           Pertinent Vitals/Pain Pain Assessment Pain Assessment: No/denies pain    Home Living Family/patient expects to be discharged to:: Private residence Living Arrangements: Children Available Help at Discharge: Family;Available PRN/intermittently Type of Home: Apartment Home Access: Level entry       Home Layout: One level Home Equipment: Cane - single point Additional Comments: plans to stay with dtr    Prior Function Prior Level of Function : Independent/Modified Independent  Hand Dominance        Extremity/Trunk Assessment   Upper Extremity Assessment Upper Extremity Assessment: Overall WFL for tasks assessed (noted jerking of the  arms)    Lower Extremity Assessment Lower Extremity Assessment: Overall WFL for tasks assessed    Cervical / Trunk Assessment Cervical / Trunk Assessment: Normal  Communication   Communication: No difficulties  Cognition Arousal/Alertness: Lethargic Behavior During  Therapy: WFL for tasks assessed/performed Overall Cognitive Status: Within Functional Limits for tasks assessed                                          General Comments      Exercises     Assessment/Plan    PT Assessment Patient needs continued PT services  PT Problem List Decreased strength;Decreased mobility;Decreased safety awareness;Decreased activity tolerance;Cardiopulmonary status limiting activity       PT Treatment Interventions DME instruction;Therapeutic activities;Gait training;Therapeutic exercise;Patient/family education;Functional mobility training    PT Goals (Current goals can be found in the Care Plan section)  Acute Rehab PT Goals Patient Stated Goal: go home PT Goal Formulation: With patient Time For Goal Achievement: 01/09/23 Potential to Achieve Goals: Good    Frequency Min 1X/week     Co-evaluation               AM-PAC PT "6 Clicks" Mobility  Outcome Measure Help needed turning from your back to your side while in a flat bed without using bedrails?: None Help needed moving from lying on your back to sitting on the side of a flat bed without using bedrails?: None Help needed moving to and from a bed to a chair (including a wheelchair)?: A Little Help needed standing up from a chair using your arms (e.g., wheelchair or bedside chair)?: A Little Help needed to walk in hospital room?: A Lot Help needed climbing 3-5 steps with a railing? : Total 6 Click Score: 17    End of Session Equipment Utilized During Treatment: Oxygen Activity Tolerance: Patient limited by fatigue Patient left: in bed;with call bell/phone within reach;with bed alarm set Nurse Communication: Mobility status PT Visit Diagnosis: Unsteadiness on feet (R26.81);Difficulty in walking, not elsewhere classified (R26.2)    Time: 1345-1415 PT Time Calculation (min) (ACUTE ONLY): 30 min   Charges:   PT Evaluation $PT Eval Low Complexity: 1 Low PT  Treatments $Therapeutic Activity: 8-22 mins        Blanchard Kelch PT Acute Rehabilitation Services Office [phone] Weekend pager-[phone]   Rada Hay 12/26/2022, 2:27 PM

## 2022-12-26 NOTE — Progress Notes (Signed)
Patient offered heated high flow cannula. Patient refused stated it scratched his nose. Patient prefers venturi mask.

## 2022-12-26 NOTE — Progress Notes (Signed)
Patient woke-up and demanded to be back on Venturi mask so he can eat and drink. Patient O2 drops to 70's because he takes off his mask. RN explained to patient that after every bite & sips he needs to place his Venturi mask on. The patient insist that he understand and knows his condition but it is also important for is his self care esp he has depression and doesn't need to be stressed out. He said that he appreciate that we care for him but we are overdoing it so he is stressed and overwhelmed. [facility]/ A.Virgel Manifold made aware.

## 2022-12-26 NOTE — Progress Notes (Signed)
PROGRESS NOTE  Kurt [name] ZOX:[id number] DOB: [birth year] DOA: 12/23/2022 PCP: Fleet Contras, MD   LOS: 3 days   Brief Narrative / Interim history: Kurt [name] is a 62 y.o. male with medical history significant of depression, GERD, COPD, heart failure with preserved ejection fraction, pulmonary hypertension, obstructive sleep apnea who presents emergency department with progressively worsening shortness of breath.  Patient typically is on 3 L nasal cannula at home.  He states that over the last 3 weeks has noticed worsening shortness of breath and weight gain.  He is also had limited access to his breathing treatments.  He has noticed worsening swelling of his left leg.  He is supposed to have a ultrasound to assess for DVT in outpatient setting.  CT pulmonary embolism study demonstrated extensive pulm embolism in the right pulmonary artery with evidence of right heart strain.  In the emergency department, patient required up to 12 L high flow oxygen to maintain his saturations.  Case discussed with PCCM, they referred for [facility] admission.  Subjective / 24h Interval events: Feels well, alert, denies any significant shortness of breath.  Wants to move more  Assesement and Plan: Principal Problem:   Acute hypoxic respiratory failure (HCC) Active Problems:   OSA on CPAP   Essential hypertension   Morbid obesity due to excess calories (HCC)   GERD without esophagitis   Acute pulmonary embolism (HCC)   Cor pulmonale (HCC)   DVT (deep venous thrombosis) (HCC)   Demand ischemia   Principal problem Unprovoked PE and DVT -DVT ultrasound positive for left-sided DVT. Echocardiogram shows EF 70 to 75%, grade 1 diastolic dysfunction, elevated right ventricular pressure overload, consistent with cor pulmonale -Patient remains hemodynamically stable, continue IV heparin for now  Active problems Acute on chronic hypoxemic respiratory failure secondary to PE, acute respiratory acidosis -Uses 3 L  nasal cannula O2 at baseline, currently on 50% FiO2 Venturi mask.  He does not like the BiPAP.  -Continue to wean off as tolerated.  He is at risk for recurrent respiratory acidosis.  Somewhat nonadherence to nightly BiPAP  Demand ischemia -Secondary to above, troponin 43, 42   Hypertension -Holding home losartan, HCTZ, for hemodynamic support in setting of PE.  Give Lasix x 1 today   GERD -PPI   OSA - BiPAP PRN for now as above   Obesity -BMI 41.65   Scheduled Meds:  arformoterol  15 mcg Nebulization BID   budesonide (PULMICORT) nebulizer solution  0.5 mg Nebulization BID   Chlorhexidine Gluconate Cloth  6 each Topical Daily   ipratropium-albuterol  3 mL Nebulization TID   mouth rinse  15 mL Mouth Rinse 4 times per day   pantoprazole  40 mg Oral QAC breakfast   Continuous Infusions:  heparin 1,400 Units/hr (12/26/22 1039)   PRN Meds:.mouth rinse  Current Outpatient Medications  Medication Instructions   albuterol (PROVENTIL) 2.5 mg, Nebulization, Every 6 hours PRN   aspirin EC 325 mg, Oral, Daily   Budeson-Glycopyrrol-Formoterol (BREZTRI AEROSPHERE) 160-9-4.8 MCG/ACT AERO 2 puffs, Inhalation, 2 times daily   cetirizine (ZYRTEC) 10 mg, Oral, As needed   Chlorphen-PE-Acetaminophen (NOREL AD) 4-10-325 MG TABS Oral, 2 times daily between meals and at bedtime PRN   famotidine (PEPCID) 20 MG tablet One after supper   fluticasone (FLONASE) 50 MCG/ACT nasal spray 1 spray, Each Nare, Daily   furosemide (LASIX) 40 mg, Oral, Daily   ibuprofen (ADVIL) 800 mg, Oral, 3 times daily   losartan-hydrochlorothiazide (HYZAAR) 50-12.5 MG tablet TAKE 1 TABLET BY  MOUTH EVERY DAY   MITIGARE 0.6 MG CAPS 1 capsule, Oral, 2 times daily PRN   montelukast (SINGULAIR) 10 mg, Oral, Daily at bedtime   pantoprazole (PROTONIX) 40 mg, Oral, Daily before breakfast   potassium chloride (K-DUR) 10 MEQ tablet 10 mEq, Oral, Daily   tadalafil (CIALIS) 20 mg, Oral, Daily PRN   tiZANidine (ZANAFLEX) 4 mg, Oral,  Every 6 hours PRN   UNABLE TO FIND Med Name: CPAP with 3lpm o2 <BR>Adapt    Vitamin D (Ergocalciferol) (DRISDOL) 50,000 Units, Oral, Weekly    Diet Orders (From admission, onward)     Start     Ordered   12/24/22 0729  Diet Heart Room service appropriate? Yes; Fluid consistency: Thin  Diet effective now       Question Answer Comment  Room service appropriate? Yes   Fluid consistency: Thin      12/24/22 0728            DVT prophylaxis: Place TED hose Start: 12/23/22 2145   Lab Results  Component Value Date   PLT 168 12/26/2022      Code Status: Full Code  Family Communication: no family at bedside   Status is: Inpatient Remains inpatient appropriate because: severity of illness  Level of care: Stepdown  Consultants:  PCCM  Objective: Vitals:   12/26/22 0600 12/26/22 0756 12/26/22 0800 12/26/22 0817  BP: 122/62  (!) 144/80   Pulse: (!) 110  (!) 116   Resp: (!) 26  (!) 26   Temp:  97.7 F (36.5 C)    TempSrc:  Oral    SpO2: 90%  95% (!) 88%  Weight:      Height:        Intake/Output Summary (Last 24 hours) at 12/26/2022 1046 Last data filed at 12/26/2022 1000 Gross per 24 hour  Intake 1571.19 ml  Output 3150 ml  Net -1578.81 ml    Wt Readings from Last 3 Encounters:  12/23/22 118.8 kg  03/30/21 118.8 kg  05/01/20 124.6 kg    Examination:  Constitutional: NAD Eyes: lids and conjunctivae normal, no scleral icterus ENMT: mmm Neck: normal, supple Respiratory: clear to auscultation bilaterally, no wheezing, no crackles. Normal respiratory effort.  Cardiovascular: Regular rate and rhythm, no murmurs / rubs / gallops. No LE edema. Abdomen: soft, no distention, no tenderness. Bowel sounds positive.   Data Reviewed: I have independently reviewed following labs and imaging studies   CBC Recent Labs  Lab 12/23/22 1845 12/24/22 0400 12/25/22 0318 12/26/22 0322  WBC 10.7* 8.7 13.9* 10.9*  HGB 15.4 15.8 15.3 15.6  HCT 51.6 52.5* 53.4* 56.1*  PLT  175 178 179 168  MCV 89.1 89.3 93.8 97.9  MCH 26.6 26.9 26.9 27.2  MCHC 29.8* 30.1 28.7* 27.8*  RDW 15.9* 15.7* 15.9* 16.9*     Recent Labs  Lab 12/23/22 1845 12/23/22 2209 12/25/22 0318 12/26/22 0322  NA 132*  --  136 137  K 4.1  --  4.8 5.4*  CL 92*  --  96* 95*  CO2 32  --  33* 34*  GLUCOSE 112*  --  127* 117*  BUN 25*  --  15 12  CREATININE 1.25*  --  0.96 0.94  CALCIUM 8.0*  --  8.1* 8.4*  AST  --   --   --  27  ALT  --   --   --  49*  ALKPHOS  --   --   --  72  BILITOT  --   --   --  0.7  ALBUMIN  --   --   --  3.0*  MG  --   --   --  2.3  LATICACIDVEN 1.6 1.2  --   --   BNP 417.8*  --   --   --      ------------------------------------------------------------------------------------------------------------------ No results for input(s): "CHOL", "HDL", "LDLCALC", "TRIG", "CHOLHDL", "LDLDIRECT" in the last 72 hours.  Lab Results  Component Value Date   HGBA1C 6.8 (H) 10/19/2009   ------------------------------------------------------------------------------------------------------------------ No results for input(s): "TSH", "T4TOTAL", "T3FREE", "THYROIDAB" in the last 72 hours.  Invalid input(s): "FREET3"  Cardiac Enzymes No results for input(s): "CKMB", "TROPONINI", "MYOGLOBIN" in the last 168 hours.  Invalid input(s): "CK" ------------------------------------------------------------------------------------------------------------------    Component Value Date/Time   BNP 417.8 (H) 12/23/2022 1845    CBG: No results for input(s): "GLUCAP" in the last 168 hours.  Recent Results (from the past 240 hour(s))  Blood culture (routine x 2)     Status: None (Preliminary result)   Collection Time: 12/23/22  6:45 PM   Specimen: BLOOD RIGHT FOREARM  Result Value Ref Range Status   Specimen Description   Final    BLOOD RIGHT FOREARM Performed at [facility], [address], [place], [place] [address]    Special Requests   Final     BOTTLES DRAWN AEROBIC AND ANAEROBIC Blood Culture results may not be optimal due to an excessive volume of blood received in culture bottles Performed at [facility], [address], [place], [place] [address]    Culture   Final    NO GROWTH 3 DAYS Performed at [facility], [address], [place], [place] [address]    Report Status PENDING  Incomplete  Blood culture (routine x 2)     Status: None (Preliminary result)   Collection Time: 12/23/22  6:58 PM   Specimen: BLOOD RIGHT HAND  Result Value Ref Range Status   Specimen Description   Final    BLOOD RIGHT HAND Performed at [facility], [address], [place], [place] [address]    Special Requests   Final    BOTTLES DRAWN AEROBIC AND ANAEROBIC Blood Culture results may not be optimal due to an excessive volume of blood received in culture bottles Performed at [facility], [address], [place], [place] [address]    Culture   Final    NO GROWTH 3 DAYS Performed at [facility], [address], [place], [place] [address]    Report Status PENDING  Incomplete  MRSA Next Gen by PCR, Nasal     Status: None   Collection Time: 12/24/22  9:16 AM   Specimen: Nasal Mucosa; Nasal Swab  Result Value Ref Range Status   MRSA by PCR Next Gen NOT DETECTED NOT DETECTED Final    Comment: (NOTE) The GeneXpert MRSA Assay (FDA approved for NASAL specimens only), is one component of a comprehensive MRSA colonization surveillance program. It is not intended to diagnose MRSA infection nor to guide or monitor treatment for MRSA infections. Test performance is not FDA approved in patients less than 73 years old. Performed at [facility], [address], [place], [place] [address]      Radiology Studies: [facility] Chest Port 1 View  Result Date: 12/26/2022 CLINICAL DATA:  [id number] Respiratory failure [facility]) [phone] EXAM: PORTABLE CHEST 1 VIEW COMPARISON:  December 23, 2022 FINDINGS: The cardiomediastinal silhouette is unchanged in contour.Persistent elevation of the  RIGHT hemidiaphragm. No pleural effusion. No pneumothorax. RIGHT basilar atelectasis. No acute pleuroparenchymal abnormality. IMPRESSION: Persistent elevation of the RIGHT hemidiaphragm with RIGHT basilar atelectasis. Electronically Signed   By: Meda Klinefelter M.D.   On: 12/26/2022 10:04     Pamella Pert, MD, PhD [facility]  Between 7 am - 7 pm I am available, please contact me via Amion (for emergencies) or Securechat (non urgent messages)  Between 7 pm - 7 am I am not available, please contact night coverage MD/APP via Amion

## 2022-12-26 NOTE — Progress Notes (Signed)
   12/26/22 0239  BiPAP/CPAP/SIPAP  BiPAP/CPAP/SIPAP V60   Pt. Wanted off BiPAP, RN placed to Venti mask @ 55%.

## 2022-12-26 NOTE — Progress Notes (Signed)
ANTICOAGULATION CONSULT NOTE  Pharmacy Consult for heparin Indication: acute R LE DVT and PE  Allergies  Allergen Reactions   Penicillins Itching    Has patient had a PCN reaction causing immediate rash, facial/tongue/throat swelling, SOB or lightheadedness with hypotension: No Has patient had a PCN reaction causing severe rash involving mucus membranes or skin necrosis: No Has patient had a PCN reaction that required hospitalization: No Has patient had a PCN reaction occurring within the last 10 years: No If all of the above answers are "NO", then may proceed with Cephalosporin use.    Patient Measurements: Height: 5' 6.5" (168.9 cm) Weight: 118.8 kg (262 lb) IBW/kg (Calculated) : 64.95 Heparin Dosing Weight: 92.5 kg  Vital Signs: Temp: 98.9 F (37.2 C) (06/20 1235) Temp Source: Axillary (06/20 1235) BP: 131/68 (06/20 1235) Pulse Rate: 106 (06/20 1235)  Labs: Recent Labs    12/23/22 1845 12/23/22 2209 12/24/22 0400 12/24/22 0446 12/25/22 0318 12/26/22 0322 12/26/22 1345  HGB 15.4  --  15.8  --  15.3 15.6  --   HCT 51.6  --  52.5*  --  53.4* 56.1*  --   PLT 175  --  178  --  179 168  --   HEPARINUNFRC  --   --  0.92*   < > 0.54 >1.10* 0.86*  CREATININE 1.25*  --   --   --  0.96 0.94  --   TROPONINIHS 43* 42*  --   --   --   --   --    < > = values in this interval not displayed.     Estimated Creatinine Clearance: 102.2 mL/min (by C-G formula based on SCr of 0.94 mg/dL).   Medical History: Past Medical History:  Diagnosis Date   Anxiety    Arthritis    "hands, knees, joints, lower back" (06/20/2017)   Borderline diabetes    Chronic bronchitis (HCC)    COPD (chronic obstructive pulmonary disease) (HCC)    dx'd 06/20/2017   Depression    GERD (gastroesophageal reflux disease)    Gout    Hypertension    OSA on CPAP    Assessment: 62 yo M with acute R LE DVT & extensive PE. CT evidence of right heart strain. Pharmacy consulted to manage heparin.  No  anticoagulants PTA.  12/26/2022: 13:45 heparin level supra-therapeutic with IV heparin currently infusing at 1400 units/hr CBC WNL No bleeding or infusion related concerns noted per RN   Goal of Therapy:  Heparin level 0.3-0.7 units/ml Monitor platelets by anticoagulation protocol: Yes   Plan:  - Decrease IV heparin to 1250 units/hr - Recheck heparin level at 2100 - Daily heparin level & CBC while on heparin - F/U long-term anticoagulation plans   Thank you for allowing pharmacy to be a part of this patient's care.  Selinda Eon, PharmD, BCPS Clinical Pharmacist [facility] 12/26/2022 2:32 PM

## 2022-12-26 NOTE — Progress Notes (Incomplete)
Patient O2 less than 88% goal, tachypnic,

## 2022-12-26 NOTE — Progress Notes (Signed)
   12/25/22 2258  BiPAP/CPAP/SIPAP  BiPAP/CPAP/SIPAP Pt Type Adult  BiPAP/CPAP/SIPAP V60  Mask Type Full face mask  Mask Size Large  Set Rate 20 breaths/min  Respiratory Rate 24 breaths/min  IPAP 22 cmH20  EPAP 8 cmH2O  FiO2 (%) (S)  35 % (Titrating per order)  Minute Ventilation 14  Leak 00  Peak Inspiratory Pressure (PIP) 22  Tidal Volume (Vt) 350  Press High Alarm 25 cmH2O  Press Low Alarm 5 cmH2O  CPAP/SIPAP surface wiped down Yes   Pt. agreed to be placed on BiPAP/V-60, RN aware.

## 2022-12-26 NOTE — Progress Notes (Signed)
RN tried to change patient's venturi mask to HF nasal canula while eating & drinking but he refused because per patient "it hurts my nose".

## 2022-12-27 DIAGNOSIS — J9601 Acute respiratory failure with hypoxia: Secondary | ICD-10-CM | POA: Diagnosis not present

## 2022-12-27 LAB — CBC
HCT: 52.1 % — ABNORMAL HIGH (ref 39.0–52.0)
Hemoglobin: 15 g/dL (ref 13.0–17.0)
MCH: 27.1 pg (ref 26.0–34.0)
MCHC: 28.8 g/dL — ABNORMAL LOW (ref 30.0–36.0)
MCV: 94 fL (ref 80.0–100.0)
Platelets: 180 10*3/uL (ref 150–400)
RBC: 5.54 MIL/uL (ref 4.22–5.81)
RDW: 15.7 % — ABNORMAL HIGH (ref 11.5–15.5)
WBC: 11.8 10*3/uL — ABNORMAL HIGH (ref 4.0–10.5)
nRBC: 0.2 % (ref 0.0–0.2)

## 2022-12-27 LAB — COMPREHENSIVE METABOLIC PANEL
ALT: 45 U/L — ABNORMAL HIGH (ref 0–44)
AST: 23 U/L (ref 15–41)
Albumin: 3.3 g/dL — ABNORMAL LOW (ref 3.5–5.0)
Alkaline Phosphatase: 72 U/L (ref 38–126)
Anion gap: 4 — ABNORMAL LOW (ref 5–15)
BUN: 10 mg/dL (ref 6–20)
CO2: 38 mmol/L — ABNORMAL HIGH (ref 22–32)
Calcium: 8.2 mg/dL — ABNORMAL LOW (ref 8.9–10.3)
Chloride: 95 mmol/L — ABNORMAL LOW (ref 98–111)
Creatinine, Ser: 0.9 mg/dL (ref 0.61–1.24)
GFR, Estimated: >60 mL/min (ref >60)
Glucose, Bld: 108 mg/dL — ABNORMAL HIGH (ref 70–99)
Potassium: 5 mmol/L (ref 3.5–5.1)
Sodium: 137 mmol/L (ref 135–145)
Total Bilirubin: 0.3 mg/dL (ref 0.3–1.2)
Total Protein: 7.2 g/dL (ref 6.5–8.1)

## 2022-12-27 LAB — BLOOD GAS, VENOUS
Acid-Base Excess: 12.8 mmol/L — ABNORMAL HIGH (ref 0.0–2.0)
Bicarbonate: 45 mmol/L — ABNORMAL HIGH (ref 20.0–28.0)
Drawn by: 69279
O2 Saturation: 86 %
Patient temperature: 36.9
pCO2, Ven: 104 mmHg (ref 44–60)
pH, Ven: 7.24 — ABNORMAL LOW (ref 7.25–7.43)
pO2, Ven: 53 mmHg — ABNORMAL HIGH (ref 32–45)

## 2022-12-27 LAB — CULTURE, BLOOD (ROUTINE X 2)

## 2022-12-27 LAB — MAGNESIUM: Magnesium: 2.3 mg/dL (ref 1.7–2.4)

## 2022-12-27 LAB — HEPARIN LEVEL (UNFRACTIONATED): Heparin Unfractionated: 0.62 IU/mL (ref 0.30–0.70)

## 2022-12-27 MED ORDER — APIXABAN 5 MG PO TABS: 5.0000 mg | ORAL_TABLET | Freq: Two times a day (BID) | ORAL | Status: DC

## 2022-12-27 MED ORDER — APIXABAN 5 MG PO TABS
10.0000 mg | ORAL_TABLET | Freq: Two times a day (BID) | ORAL | Status: DC
  Administered 2022-12-27 – 2023-01-01 (×11): 10 mg via ORAL
  Filled 2022-12-27 (×11): qty 2

## 2022-12-27 MED ORDER — FUROSEMIDE 40 MG PO TABS
40.0000 mg | ORAL_TABLET | Freq: Every day | ORAL | Status: DC
  Administered 2022-12-27 – 2023-01-01 (×6): 40 mg via ORAL
  Filled 2022-12-27 (×6): qty 1

## 2022-12-27 MED ORDER — HYDROXYZINE HCL 10 MG PO TABS
10.0000 mg | ORAL_TABLET | Freq: Three times a day (TID) | ORAL | Status: DC | PRN
  Filled 2022-12-27 (×2): qty 1

## 2022-12-27 NOTE — Progress Notes (Signed)
Placed patient on home CPAP for the night with oxygen set at 14LPm

## 2022-12-27 NOTE — Progress Notes (Addendum)
ANTICOAGULATION CONSULT NOTE  Pharmacy Consult for heparin Indication: acute R LE DVT and PE  Allergies  Allergen Reactions   Penicillins Itching    Has patient had a PCN reaction causing immediate rash, facial/tongue/throat swelling, SOB or lightheadedness with hypotension: No Has patient had a PCN reaction causing severe rash involving mucus membranes or skin necrosis: No Has patient had a PCN reaction that required hospitalization: No Has patient had a PCN reaction occurring within the last 10 years: No If all of the above answers are "NO", then may proceed with Cephalosporin use.    Patient Measurements: Height: 5' 6.5" (168.9 cm) Weight: 118.8 kg (262 lb) IBW/kg (Calculated) : 64.95 Heparin Dosing Weight: 92.5 kg  Vital Signs: Temp: 97.9 F (36.6 C) (06/21 0335) Temp Source: Axillary (06/21 0335) BP: 127/54 (06/21 0600) Pulse Rate: 106 (06/21 0600)  Labs: Recent Labs    12/25/22 0318 12/26/22 0322 12/26/22 1345 12/26/22 2109 12/27/22 0331  HGB 15.3 15.6  --   --  15.0  HCT 53.4* 56.1*  --   --  52.1*  PLT 179 168  --   --  180  HEPARINUNFRC 0.54 >1.10* 0.86* 0.54 0.62  CREATININE 0.96 0.94  --   --  0.90     Estimated Creatinine Clearance: 106.8 mL/min (by C-G formula based on SCr of 0.9 mg/dL).   Medical History: Past Medical History:  Diagnosis Date   Anxiety    Arthritis    "hands, knees, joints, lower back" (06/20/2017)   Borderline diabetes    Chronic bronchitis (HCC)    COPD (chronic obstructive pulmonary disease) (HCC)    dx'd 06/20/2017   Depression    GERD (gastroesophageal reflux disease)    Gout    Hypertension    OSA on CPAP    Assessment: 62 yo M with acute R LE DVT & extensive PE. CT evidence of right heart strain. Pharmacy consulted to manage heparin.  No anticoagulants PTA.  12/27/2022: 03:31 heparin level therapeutic with IV heparin currently infusing at 1250 units/hr CBC WNL No bleeding or infusion related concerns noted per  RN (other than a minor nose bleed, which is more related to his O2 drying him out).   Goal of Therapy:  Heparin level 0.3-0.7 units/ml Monitor platelets by anticoagulation protocol: Yes   Plan:  - Continue IV heparin @ 1250 units/hr - Monitor daily heparin level, CBC, signs/symptoms of bleeding - F/U long-term anticoagulation plans   Thank you for allowing pharmacy to be a part of this patient's care.  Selinda Eon, PharmD, BCPS Clinical Pharmacist [facility] 12/27/2022 7:21 AM  This afternoon, Pharmacy consulted to transition from IV heparin to apixaban  Plan: Discontinue IV heparin at time that apixaban is started Apixaban 10 mg twice dialy for 7 days followed by 5 mg twice daily With no dose adjustments anticipated, Pharmacy will sign off consult and continue to monitor CBC as ordered by provider along with signs/symptoms of bleeding  Thank you for allowing pharmacy to be a part of this patient's care.  Selinda Eon, PharmD, BCPS Clinical Pharmacist [place] 12/27/2022 12:35 PM

## 2022-12-27 NOTE — Progress Notes (Signed)
OT Cancellation Note  Patient Details Name: Kurt [name] MRN: [id number] DOB: 1961/01/10   Cancelled Treatment:    Reason Eval/Treat Not Completed: Medical issues which prohibited therapy: Per RN, pt with increased confusion today, lethargy and required return to BIPAP. RN requests OT to hold for today.   Theodoro Clock 12/27/2022, 11:31 AM

## 2022-12-27 NOTE — Progress Notes (Signed)
On assessment, patient alert and oriented. Patient irritable and informed this nurse that he wants all measures stopped (medication, BiPAP/O2) and stated, "I think you all are trying to kill me. I can't breath right now." This nurse educated the patient on why he is wearing the BiPAP, why he is on a Heparin drip, and what we have assessed with his CO2 retention today. Patient gave his verbal understanding on the plan of care and agreed to continue with current plan of care. Informed Dr. Elvera Lennox of new onset agitation and requested PRN medication for anxiety that will not alter patient's mental status.   See new order for PRN, no new imaging ordered at this time, continue with current plan of care.

## 2022-12-27 NOTE — TOC Progression Note (Addendum)
Transition of Care [facility]) - Progression Note    Patient Details  Name: Kurt [name] MRN: [id number] Date of Birth: 10-08-1960  Transition of Care [facility]) CM/SW Contact  Howell Rucks, RN Phone Number: 12/27/2022, 1:46 PM  Clinical Narrative:  Met with pt at bedside to review for dc planning PT recommendation for [facility] PT, pt agreeable, pt reports he currently lives with his dtr and they are working on adding him to her lease, pt gave verbal approval to speak with his dtr (Demetrice), phone number on file non working number, NCM call to pt's other contact on file, his mother, no vm set up. NCM will request updated phone contact from pt. HH PT. Felipe Drone request for [facility] PT sent out. TOC will continue to follow.  -4:05pm HH agencies contacted for [facility] PT-all unable to accept due to insurance OON: [place], [place], [place], [place], [place], [place]. [facility] potentially able to accept if paired with a UHC PPO, Blue Mcare or Traditional Medicare. NCM outreached other NCM/SW for assistance with pairing. TOC will continue to follow  -4:19pm Met with pt at bedside, confirmed dtr's (Demetrice) contact number [phone]. NCM call to pt's dtr to discuss dc planning and confirm pt able to return to her home, no answer, vm left with NCM name and phone number for call back   -4:54pm Call received from pt's dtr (Demetrice), introduced role of TOC/NCM and reviewed for dc planning. Demetrice reports she lives in an  income-based apartment, anyone residing with her has to provide 2023 tax return information, undergo a background check including cost, and other information requested by the apartment Production designer, theatre/television/film. Demetrice reports since pt has been ill he has not been able to provide this information. Demetrice reports pt is able to return to live with her for a limited time but will have to present documents required to continue to live with her permanently. Demetrice reports there  are no other family members who can offer  housing to the pt.  NCM inquired if pt had income to reside in an extended stay hotel. Demetrice reports to her knowledge pt has no source of income but has applied for Disability. TOC will continue to follow.       Expected Discharge Plan and Services                                               Social Determinants of Health (SDOH) Interventions SDOH Screenings   Food Insecurity: No Food Insecurity (12/24/2022)  Housing: Patient Declined (12/24/2022)  Transportation Needs: Unmet Transportation Needs (12/24/2022)  Utilities: Not At Risk (12/24/2022)  Tobacco Use: Medium Risk (12/23/2022)    Readmission Risk Interventions    12/27/2022    1:45 PM  Readmission Risk Prevention Plan  Post Dischage Appt Complete  Medication Screening Complete  Transportation Screening Complete

## 2022-12-27 NOTE — Discharge Instructions (Addendum)
Information on my medicine - ELIQUIS (apixaban)  Why was Eliquis prescribed for you? Eliquis was prescribed to treat blood clots that may have been found in the veins of your legs (deep vein thrombosis) or in your lungs (pulmonary embolism) and to reduce the risk of them occurring again.  What do You need to know about Eliquis ? The starting dose is 10 mg (two 5 mg tablets) taken TWICE daily for the FIRST SEVEN (7) DAYS, then on 01/03/2023 the dose is reduced to ONE 5 mg tablet taken TWICE daily.  Eliquis may be taken with or without food.   Try to take the dose about the same time in the morning and in the evening. If you have difficulty swallowing the tablet whole please discuss with your pharmacist how to take the medication safely.  Take Eliquis exactly as prescribed and DO NOT stop taking Eliquis without talking to the doctor who prescribed the medication.  Stopping may increase your risk of developing a new blood clot.  Refill your prescription before you run out.  After discharge, you should have regular check-up appointments with your healthcare provider that is prescribing your Eliquis.    What do you do if you miss a dose? If a dose of ELIQUIS is not taken at the scheduled time, take it as soon as possible on the same day and twice-daily administration should be resumed. The dose should not be doubled to make up for a missed dose.  Important Safety Information A possible side effect of Eliquis is bleeding. You should call your healthcare provider right away if you experience any of the following: Bleeding from an injury or your nose that does not stop. Unusual colored urine (red or dark brown) or unusual colored stools (red or black). Unusual bruising for unknown reasons. A serious fall or if you hit your head (even if there is no bleeding).  Some medicines may interact with Eliquis and might increase your risk of bleeding or clotting while on Eliquis. To help avoid this,  consult your healthcare provider or pharmacist prior to using any new prescription or non-prescription medications, including herbals, vitamins, non-steroidal anti-inflammatory drugs (NSAIDs) and supplements.  This website has more information on Eliquis (apixaban): http://[email or web address].com/eliquis/home  Additional Discharge Instructions   Please get your medications reviewed and adjusted by your Primary MD.  Please request your Primary MD to go over all Hospital Tests and Procedure/Radiological results at the follow up, please get all Hospital records sent to your Prim MD by signing hospital release before you go home.  If you had Pneumonia of Lung problems at the Hospital: Please get a 2 view Chest X ray done in approximately 4 weeks after hospital discharge or sooner if instructed by your Primary MD.  If you have Congestive Heart Failure: Please call your Cardiologist or Primary MD anytime you have any of the following symptoms:  1) 3 pound weight gain in 24 hours or 5 pounds in 1 week  2) shortness of breath, with or without a dry hacking cough  3) swelling in the hands, feet or stomach  4) if you have to sleep on extra pillows at night in order to breathe  Follow cardiac low salt diet and 1.5 lit/day fluid restriction.  If you have diabetes Accuchecks 4 times/day, Once in AM empty stomach and then before each meal. Log in all results and show them to your primary doctor at your next visit. If any glucose reading is under 80 or above 300 call  your primary MD immediately.  If you have Seizure/Convulsions/Epilepsy: Please do not drive, operate heavy machinery, participate in activities at heights or participate in high speed sports until you have seen by Primary MD or a Neurologist and advised to do so again. Per [facility] statutes, patients with seizures are not allowed to drive until they have been seizure-free for six months.  Use caution when using heavy equipment or  power tools. Avoid working on ladders or at heights. Take showers instead of baths. Ensure the water temperature is not too high on the home water heater. Do not go swimming alone. Do not lock yourself in a room alone (i.e. bathroom). When caring for infants or small children, sit down when holding, feeding, or changing them to minimize risk of injury to the child in the event you have a seizure. Maintain good sleep hygiene. Avoid alcohol.   If you had Gastrointestinal Bleeding: Please ask your Primary MD to check a complete blood count within one week of discharge or at your next visit. Your endoscopic/colonoscopic biopsies that are pending at the time of discharge, will also need to followed by your Primary MD.  Get Medicines reviewed and adjusted. Please take all your medications with you for your next visit with your Primary MD  Please request your Primary MD to go over all hospital tests and procedure/radiological results at the follow up, please ask your Primary MD to get all Hospital records sent to his/her office.  If you experience worsening of your admission symptoms, develop shortness of breath, life threatening emergency, suicidal or homicidal thoughts you must seek medical attention immediately by calling 911 or calling your MD immediately  if symptoms less severe.  You must read complete instructions/literature along with all the possible adverse reactions/side effects for all the Medicines you take and that have been prescribed to you. Take any new Medicines after you have completely understood and accpet all the possible adverse reactions/side effects.   Do not drive or operate heavy machinery when taking Pain medications.   Do not take more than prescribed Pain, Sleep and Anxiety Medications  Special Instructions: If you have smoked or chewed Tobacco  in the last 2 yrs please stop smoking, stop any regular Alcohol  and or any Recreational drug use.  Wear Seat belts while  driving.  Please note You were cared for by a hospitalist during your hospital stay. If you have any questions about your discharge medications or the care you received while you were in the hospital after you are discharged, you can call the unit and asked to speak with the hospitalist on call if the hospitalist that took care of you is not available. Once you are discharged, your primary care physician will handle any further medical issues. Please note that NO REFILLS for any discharge medications will be authorized once you are discharged, as it is imperative that you return to your primary care physician (or establish a relationship with a primary care physician if you do not have one) for your aftercare needs so that they can reassess your need for medications and monitor your lab values.  You can reach the hospitalist office at phone [phone] or fax [phone]   If you do not have a primary care physician, you can call [phone] for a physician referral.

## 2022-12-27 NOTE — Progress Notes (Signed)
PROGRESS NOTE  Kurt [name] VHQ:[id number] DOB: [birth year] DOA: 12/23/2022 PCP: Fleet Contras, MD   LOS: 4 days   Brief Narrative / Interim history: Kurt [name] is a 62 y.o. male with medical history significant of depression, GERD, COPD, heart failure with preserved ejection fraction, pulmonary hypertension, obstructive sleep apnea who presents emergency department with progressively worsening shortness of breath.  Patient typically is on 3 L nasal cannula at home.  He states that over the last 3 weeks has noticed worsening shortness of breath and weight gain.  He is also had limited access to his breathing treatments.  He has noticed worsening swelling of his left leg.  He is supposed to have a ultrasound to assess for DVT in outpatient setting.  CT pulmonary embolism study demonstrated extensive pulm embolism in the right pulmonary artery with evidence of right heart strain.  In the emergency department, patient required up to 12 L high flow oxygen to maintain his saturations.  Case discussed with PCCM, they referred for [facility] admission.  Subjective / 24h Interval events: He is again sleepy this morning.  Feels lethargic  Assesement and Plan: Principal Problem:   Acute hypoxic respiratory failure (HCC) Active Problems:   OSA on CPAP   Essential hypertension   Morbid obesity due to excess calories (HCC)   GERD without esophagitis   Acute pulmonary embolism (HCC)   Cor pulmonale (HCC)   DVT (deep venous thrombosis) (HCC)   Demand ischemia   Principal problem Unprovoked PE and DVT -DVT ultrasound positive for left-sided DVT. Echocardiogram shows EF 70 to 75%, grade 1 diastolic dysfunction, elevated right ventricular pressure overload, consistent with cor pulmonale -Patient remains hemodynamically stable, transition to Eliquis today  Active problems Acute on chronic hypoxemic respiratory failure secondary to PE, acute respiratory acidosis -Uses 3 L nasal cannula O2 at baseline,  however he is noncompliant with that and has not had any oxygen in the last several months.  Currently on 15 L. -Patient had acute respiratory acidosis in the setting of carbon dioxide retention couple days ago requiring BiPAP use -Has been refusing BiPAP since for various reasons, again he is lethargic and sleepy this morning, and his VBG shows O2 retention.  Placed on BiPAP again  Demand ischemia -Secondary to above, troponin 43, 42   Hypertension -Holding home losartan, HCTZ, for hemodynamic support in setting of PE.  Resume furosemide today   GERD -PPI   OSA - BiPAP PRN for now as above   Obesity -BMI 41.65   Scheduled Meds:  arformoterol  15 mcg Nebulization BID   budesonide (PULMICORT) nebulizer solution  0.5 mg Nebulization BID   Chlorhexidine Gluconate Cloth  6 each Topical Daily   ipratropium-albuterol  3 mL Nebulization TID   mouth rinse  15 mL Mouth Rinse 4 times per day   pantoprazole  40 mg Oral QAC breakfast   Continuous Infusions:  heparin 1,250 Units/hr (12/27/22 0748)   PRN Meds:.hydrOXYzine, mouth rinse  Current Outpatient Medications  Medication Instructions   albuterol (PROVENTIL) 2.5 mg, Nebulization, Every 6 hours PRN   aspirin EC 325 mg, Oral, Daily   Budeson-Glycopyrrol-Formoterol (BREZTRI AEROSPHERE) 160-9-4.8 MCG/ACT AERO 2 puffs, Inhalation, 2 times daily   cetirizine (ZYRTEC) 10 mg, Oral, As needed   Chlorphen-PE-Acetaminophen (NOREL AD) 4-10-325 MG TABS Oral, 2 times daily between meals and at bedtime PRN   famotidine (PEPCID) 20 MG tablet One after supper   fluticasone (FLONASE) 50 MCG/ACT nasal spray 1 spray, Each Nare, Daily   furosemide (LASIX)  40 mg, Oral, Daily   ibuprofen (ADVIL) 800 mg, Oral, 3 times daily   losartan-hydrochlorothiazide (HYZAAR) 50-12.5 MG tablet TAKE 1 TABLET BY MOUTH EVERY DAY   MITIGARE 0.6 MG CAPS 1 capsule, Oral, 2 times daily PRN   montelukast (SINGULAIR) 10 mg, Oral, Daily at bedtime   pantoprazole (PROTONIX) 40 mg,  Oral, Daily before breakfast   potassium chloride (K-DUR) 10 MEQ tablet 10 mEq, Oral, Daily   tadalafil (CIALIS) 20 mg, Oral, Daily PRN   tiZANidine (ZANAFLEX) 4 mg, Oral, Every 6 hours PRN   UNABLE TO FIND Med Name: CPAP with 3lpm o2 <BR>Adapt    Vitamin D (Ergocalciferol) (DRISDOL) 50,000 Units, Oral, Weekly    Diet Orders (From admission, onward)     Start     Ordered   12/24/22 0729  Diet Heart Room service appropriate? Yes; Fluid consistency: Thin  Diet effective now       Question Answer Comment  Room service appropriate? Yes   Fluid consistency: Thin      12/24/22 0728            DVT prophylaxis: Place TED hose Start: 12/23/22 2145   Lab Results  Component Value Date   PLT 180 12/27/2022      Code Status: Full Code  Family Communication: no family at bedside   Status is: Inpatient Remains inpatient appropriate because: severity of illness  Level of care: Stepdown  Consultants:  PCCM  Objective: Vitals:   12/27/22 0920 12/27/22 1000 12/27/22 1200 12/27/22 1206  BP:  (!) 101/51 107/66   Pulse: 98 (!) 102 (!) 108   Resp: (!) 27 20 (!) 21   Temp:    98.4 F (36.9 C)  TempSrc:    Oral  SpO2: 93% (!) 89% (!) 89%   Weight:      Height:        Intake/Output Summary (Last 24 hours) at 12/27/2022 1225 Last data filed at 12/27/2022 1100 Gross per 24 hour  Intake 926.31 ml  Output 845 ml  Net 81.31 ml    Wt Readings from Last 3 Encounters:  12/23/22 118.8 kg  03/30/21 118.8 kg  05/01/20 124.6 kg    Examination:  Constitutional: Sleepy Eyes: lids and conjunctivae normal, no scleral icterus ENMT: mmm Neck: normal, supple Respiratory: clear to auscultation bilaterally, no wheezing, no crackles. Normal respiratory effort.  Cardiovascular: Regular rate and rhythm, no murmurs / rubs / gallops. No LE edema. Abdomen: soft, no distention, no tenderness. Bowel sounds positive.  Skin: no rashes  Data Reviewed: I have independently reviewed following  labs and imaging studies   CBC Recent Labs  Lab 12/23/22 1845 12/24/22 0400 12/25/22 0318 12/26/22 0322 12/27/22 0331  WBC 10.7* 8.7 13.9* 10.9* 11.8*  HGB 15.4 15.8 15.3 15.6 15.0  HCT 51.6 52.5* 53.4* 56.1* 52.1*  PLT 175 178 179 168 180  MCV 89.1 89.3 93.8 97.9 94.0  MCH 26.6 26.9 26.9 27.2 27.1  MCHC 29.8* 30.1 28.7* 27.8* 28.8*  RDW 15.9* 15.7* 15.9* 16.9* 15.7*     Recent Labs  Lab 12/23/22 1845 12/23/22 2209 12/25/22 0318 12/26/22 0322 12/27/22 0331  NA 132*  --  136 137 137  K 4.1  --  4.8 5.4* 5.0  CL 92*  --  96* 95* 95*  CO2 32  --  33* 34* 38*  GLUCOSE 112*  --  127* 117* 108*  BUN 25*  --  15 12 10   CREATININE 1.25*  --  0.96 0.94 0.90  CALCIUM 8.0*  --  8.1* 8.4* 8.2*  AST  --   --   --  27 23  ALT  --   --   --  49* 45*  ALKPHOS  --   --   --  72 72  BILITOT  --   --   --  0.7 0.3  ALBUMIN  --   --   --  3.0* 3.3*  MG  --   --   --  2.3 2.3  LATICACIDVEN 1.6 1.2  --   --   --   BNP 417.8*  --   --   --   --      ------------------------------------------------------------------------------------------------------------------ No results for input(s): "CHOL", "HDL", "LDLCALC", "TRIG", "CHOLHDL", "LDLDIRECT" in the last 72 hours.  Lab Results  Component Value Date   HGBA1C 6.8 (H) 10/19/2009   ------------------------------------------------------------------------------------------------------------------ No results for input(s): "TSH", "T4TOTAL", "T3FREE", "THYROIDAB" in the last 72 hours.  Invalid input(s): "FREET3"  Cardiac Enzymes No results for input(s): "CKMB", "TROPONINI", "MYOGLOBIN" in the last 168 hours.  Invalid input(s): "CK" ------------------------------------------------------------------------------------------------------------------    Component Value Date/Time   BNP 417.8 (H) 12/23/2022 1845    CBG: No results for input(s): "GLUCAP" in the last 168 hours.  Recent Results (from the past 240 hour(s))  Blood culture  (routine x 2)     Status: None (Preliminary result)   Collection Time: 12/23/22  6:45 PM   Specimen: BLOOD RIGHT FOREARM  Result Value Ref Range Status   Specimen Description   Final    BLOOD RIGHT FOREARM Performed at [facility], [address] [place], [place] [address]    Special Requests   Final    BOTTLES DRAWN AEROBIC AND ANAEROBIC Blood Culture results may not be optimal due to an excessive volume of blood received in culture bottles Performed at [facility], [address] [place], [place] [address]    Culture   Final    NO GROWTH 4 DAYS Performed at [facility], [address], [place], [place] [address]    Report Status PENDING  Incomplete  Blood culture (routine x 2)     Status: None (Preliminary result)   Collection Time: 12/23/22  6:58 PM   Specimen: BLOOD RIGHT HAND  Result Value Ref Range Status   Specimen Description   Final    BLOOD RIGHT HAND Performed at [facility], [address], [place], [place] [address]    Special Requests   Final    BOTTLES DRAWN AEROBIC AND ANAEROBIC Blood Culture results may not be optimal due to an excessive volume of blood received in culture bottles Performed at [facility], [address] [place], [place] [address]    Culture   Final    NO GROWTH 4 DAYS Performed at [facility], [address], [place], [place] [address]    Report Status PENDING  Incomplete  MRSA Next Gen by PCR, Nasal     Status: None   Collection Time: 12/24/22  9:16 AM   Specimen: Nasal Mucosa; Nasal Swab  Result Value Ref Range Status   MRSA by PCR Next Gen NOT DETECTED NOT DETECTED Final    Comment: (NOTE) The GeneXpert MRSA Assay (FDA approved for NASAL specimens only), is one component of a comprehensive MRSA colonization surveillance program. It is not intended to diagnose MRSA infection nor to guide or monitor treatment for MRSA infections. Test  performance is not  FDA approved in patients less than 14 years old. Performed at [facility], [place] [address] [place], [place] [address]      Radiology Studies: No results found.   Marzetta Board, MD, PhD [facility]  Between 7 am - 7 pm I am available, please contact me via Amion (for emergencies) or Securechat (non urgent messages)  Between 7 pm - 7 am I am not available, please contact night coverage MD/APP via Amion

## 2022-12-27 NOTE — Progress Notes (Signed)
   12/27/22 2051  BiPAP/CPAP/SIPAP  $ Non-Invasive Ventilator   (Pts home machine all set up and ready to be used.)  BiPAP/CPAP/SIPAP Pt Type Adult  BiPAP/CPAP/SIPAP Resmed (Pts home machine)  Mask Type Full face mask  Flow Rate 5 lpm  Patient Home Equipment Yes  Safety Check Completed by RT for Home Unit Yes, no issues noted  BiPAP/CPAP /SiPAP Vitals  Pulse Rate 99  Resp (!) 21  BP 136/60  SpO2 94 %  Bilateral Breath Sounds Clear;Diminished  MEWS Score/Color  MEWS Score 1  MEWS Score Color Green

## 2022-12-28 DIAGNOSIS — J9601 Acute respiratory failure with hypoxia: Secondary | ICD-10-CM | POA: Diagnosis not present

## 2022-12-28 LAB — CBC
HCT: 49.1 % (ref 39.0–52.0)
Hemoglobin: 14.4 g/dL (ref 13.0–17.0)
MCH: 26.9 pg (ref 26.0–34.0)
MCHC: 29.3 g/dL — ABNORMAL LOW (ref 30.0–36.0)
MCV: 91.6 fL (ref 80.0–100.0)
Platelets: 186 10*3/uL (ref 150–400)
RBC: 5.36 MIL/uL (ref 4.22–5.81)
RDW: 15.9 % — ABNORMAL HIGH (ref 11.5–15.5)
WBC: 11.2 10*3/uL — ABNORMAL HIGH (ref 4.0–10.5)
nRBC: 0 % (ref 0.0–0.2)

## 2022-12-28 LAB — COMPREHENSIVE METABOLIC PANEL
ALT: 40 U/L (ref 0–44)
AST: 25 U/L (ref 15–41)
Albumin: 2.9 g/dL — ABNORMAL LOW (ref 3.5–5.0)
Alkaline Phosphatase: 61 U/L (ref 38–126)
Anion gap: 6 (ref 5–15)
BUN: 13 mg/dL (ref 6–20)
CO2: 37 mmol/L — ABNORMAL HIGH (ref 22–32)
Calcium: 8.5 mg/dL — ABNORMAL LOW (ref 8.9–10.3)
Chloride: 92 mmol/L — ABNORMAL LOW (ref 98–111)
Creatinine, Ser: 0.79 mg/dL (ref 0.61–1.24)
GFR, Estimated: >60 mL/min (ref >60)
Glucose, Bld: 94 mg/dL (ref 70–99)
Potassium: 4.6 mmol/L (ref 3.5–5.1)
Sodium: 135 mmol/L (ref 135–145)
Total Bilirubin: 0.6 mg/dL (ref 0.3–1.2)
Total Protein: 6.6 g/dL (ref 6.5–8.1)

## 2022-12-28 LAB — CULTURE, BLOOD (ROUTINE X 2)

## 2022-12-28 LAB — MAGNESIUM: Magnesium: 2.2 mg/dL (ref 1.7–2.4)

## 2022-12-28 MED ORDER — POLYVINYL ALCOHOL 1.4 % OP SOLN
1.0000 [drp] | OPHTHALMIC | Status: DC | PRN
  Administered 2022-12-31: 1 [drp] via OPHTHALMIC
  Filled 2022-12-28: qty 15

## 2022-12-28 NOTE — Progress Notes (Signed)
RN called Md wanted me to switch pt from 40% venturi mask to [place] high flow nasal cannula. After talking with the patient he stated the high flow cannula scratches his nose. His breakfast had arrived. I tried to place him on 6L regular nasal cannula but sats dropped in the 70s confirmed with 2 pulse oximeters. I explained to him that I was trying to keep him safe by making sure his oxygen was at a safe level for him. I told him since his breakfast had arrived I would place him on the high flow of 8L so he could eat. He is a mouth breather so after his breakfast we will switch him back to the venturi mask. O2 sats are 91 % and he is eating his breakfast.

## 2022-12-28 NOTE — Progress Notes (Signed)
Patient's wearing his home CPAP but O2 drops to 70's and not maintaining his goal of 88%. RT adjusted his machine. Patient's saying its our fault that he was retaining CO2 because we respond to the alarm and giving him too much oxygen. Patient was educated but still persuasive of what he wants.

## 2022-12-28 NOTE — Evaluation (Signed)
Occupational Therapy Evaluation Patient Details Name: Kurt [name] MRN: [id number] DOB: Nov 13, 1960 Today's Date: 12/28/2022   History of Present Illness Kurt [name] is a 62 y.o. male with medical history significant of depression, GERD, COPD, heart failure with preserved ejection fraction, pulmonary hypertension, obstructive sleep apnea who presents emergency department 6/17 /24with progressively worsening shortness of breath.CT pulmonary embolism study demonstrated extensive pulm embolism in the right pulmonary artery with evidence of right heart strain, LLE DVT.   Clinical Impression   Patient is a 62 year old male who was admitted for above. Patient was living at home with daughter PRN support prior level. Currently, patient was using venturi mask on 8L/min with patient using mask for preference. Patients O2 ranged from 88-95% during session.  Patient max A with limited ROM of legs to complete LB Dress/bathing tasks. Patient would continue to benefit from skilled OT services at this time while admitted and after d/c to address noted deficits in order to improve overall safety and independence in ADLs.       Recommendations for follow up therapy are one component of a multi-disciplinary discharge planning process, led by the attending physician.  Recommendations may be updated based on patient status, additional functional criteria and insurance authorization.   Assistance Recommended at Discharge Intermittent Supervision/Assistance  Patient can return home with the following A little help with walking and/or transfers;A little help with bathing/dressing/bathroom;Assistance with cooking/housework;Direct supervision/assist for medications management;Assist for transportation;Help with stairs or ramp for entrance;Direct supervision/assist for financial management    Functional Status Assessment  Patient has had a recent decline in their functional status and demonstrates the ability to make  significant improvements in function in a reasonable and predictable amount of time.  Equipment Recommendations  None recommended by OT    Recommendations for Other Services       Precautions / Restrictions        Mobility Bed Mobility Overal bed mobility: Needs Assistance Bed Mobility: Sit to Supine       Sit to supine: Supervision              Balance Overall balance assessment: Mild deficits observed, not formally tested           ADL either performed or assessed with clinical judgement   ADL Overall ADL's : Needs assistance/impaired Eating/Feeding: Modified independent;Sitting   Grooming: Sitting;Wash/dry face;Modified independent Grooming Details (indicate cue type and reason): in recliner Upper Body Bathing: Set up;Sitting Upper Body Bathing Details (indicate cue type and reason): in recliner Lower Body Bathing: Min guard;Set up;Sitting/lateral leans;Sit to/from stand   Upper Body Dressing : Set up;Sitting   Lower Body Dressing: Sit to/from stand;Sitting/lateral leans;Maximal assistance Lower Body Dressing Details (indicate cue type and reason): unable to cross legs to reach feet for socks Toilet Transfer: Min guard Toilet Transfer Details (indicate cue type and reason): to transfer to recliner in room with increased time. no AD used as patient declined to hold onto anything reported " i can do this" Toileting- Architect and Hygiene: Minimal assistance;Sitting/lateral lean               Vision Patient Visual Report: No change from baseline Additional Comments: extra "mucus in eyes"            Pertinent Vitals/Pain Pain Assessment Pain Assessment: No/denies pain     Hand Dominance Right   Extremity/Trunk Assessment Upper Extremity Assessment Upper Extremity Assessment: Overall WFL for tasks assessed   Lower Extremity Assessment Lower Extremity Assessment: Defer to  PT evaluation   Cervical / Trunk Assessment Cervical /  Trunk Assessment: Normal   Communication Communication Communication: No difficulties   Cognition Arousal/Alertness: Awake/alert Behavior During Therapy: WFL for tasks assessed/performed Overall Cognitive Status: Within Functional Limits for tasks assessed                      Home Living Family/patient expects to be discharged to:: Private residence Living Arrangements: Children Available Help at Discharge: Family;Available PRN/intermittently Type of Home: Apartment Home Access: Level entry     Home Layout: One level     Bathroom Shower/Tub: Chief Strategy Officer: Standard     Home Equipment: Cane - single point   Additional Comments: plans to stay with dtr      Prior Functioning/Environment Prior Level of Function : Independent/Modified Independent               ADLs Comments: patient reported that daughter helps with LB dressing sometimes as needed        OT Problem List: Decreased activity tolerance;Impaired balance (sitting and/or standing);Decreased knowledge of precautions;Decreased range of motion      OT Treatment/Interventions: Self-care/ADL training;Energy conservation;DME and/or AE instruction;Therapeutic exercise;Therapeutic activities;Patient/family education;Balance training    OT Goals(Current goals can be found in the care plan section) Acute Rehab OT Goals Patient Stated Goal: none stated OT Goal Formulation: With patient Time For Goal Achievement: 01/11/23 Potential to Achieve Goals: Fair  OT Frequency: Min 1X/week       AM-PAC OT "6 Clicks" Daily Activity     Outcome Measure Help from another person eating meals?: None Help from another person taking care of personal grooming?: None Help from another person toileting, which includes using toliet, bedpan, or urinal?: A Little Help from another person bathing (including washing, rinsing, drying)?: A Little Help from another person to put on and taking off regular  upper body clothing?: None Help from another person to put on and taking off regular lower body clothing?: A Lot 6 Click Score: 20   End of Session Nurse Communication: Other (comment) (nurse present in room)  Activity Tolerance: Patient tolerated treatment well Patient left: in chair;with call bell/phone within reach;with nursing/sitter in room  OT Visit Diagnosis: Unsteadiness on feet (R26.81);Other abnormalities of gait and mobility (R26.89)                Time: 4098-1191 OT Time Calculation (min): 12 min Charges:  OT General Charges $OT Visit: 1 Visit OT Evaluation $OT Eval Low Complexity: 1 Low  Alvis Edgell OTR/L, MS Acute Rehabilitation Department Office# [phone]   Selinda Flavin 12/28/2022, 12:37 PM

## 2022-12-28 NOTE — Progress Notes (Signed)
PROGRESS NOTE  Kurt [name] WUJ:[id number] DOB: 1961/02/22 DOA: 12/23/2022 PCP: Fleet Contras, MD   LOS: 5 days   Brief Narrative / Interim history: Kurt [name] is a 62 y.o. male with medical history significant of depression, GERD, COPD, heart failure with preserved ejection fraction, pulmonary hypertension, obstructive sleep apnea who presents emergency department with progressively worsening shortness of breath.  Patient typically is on 3 L nasal cannula at home.  He states that over the last 3 weeks has noticed worsening shortness of breath and weight gain.  He is also had limited access to his breathing treatments.  He has noticed worsening swelling of his left leg.  He is supposed to have a ultrasound to assess for DVT in outpatient setting.  CT pulmonary embolism study demonstrated extensive pulm embolism in the right pulmonary artery with evidence of right heart strain.  In the emergency department, patient required up to 12 L high flow oxygen to maintain his saturations.  Case discussed with PCCM, they referred for [employer] admission.  Subjective / 24h Interval events: Alert this morning, no longer feels as sleepy as he was yesterday morning.  Slept with his home CPAP  Assesement and Plan: Principal Problem:   Acute hypoxic respiratory failure (HCC) Active Problems:   OSA on CPAP   Essential hypertension   Morbid obesity due to excess calories (HCC)   GERD without esophagitis   Acute pulmonary embolism (HCC)   Cor pulmonale (HCC)   DVT (deep venous thrombosis) (HCC)   Demand ischemia   Principal problem Unprovoked PE and DVT -DVT ultrasound positive for left-sided DVT. Echocardiogram shows EF 70 to 75%, grade 1 diastolic dysfunction, elevated right ventricular pressure overload, consistent with cor pulmonale -Patient remains hemodynamically stable, has been transitioned to Eliquis and seems to be tolerating well  Active problems Acute on chronic hypoxemic respiratory failure  secondary to PE, acute respiratory acidosis -Uses 3 L nasal cannula O2 at baseline, however he is noncompliant with that and has not had any oxygen in the last several months.  Currently on 8 L -Patient had acute respiratory acidosis in the setting of carbon dioxide retention couple days ago requiring BiPAP use -For the first 2 days he would become hypercarbic on his home BiPAP that is in the room, requiring our BiPAP which has higher settings.  Finally today he appears stable after he used his own machine last night  Demand ischemia -Secondary to above, troponin 43, 42   Hypertension -Holding home losartan, HCTZ, for hemodynamic support in setting of PE.  Continue furosemide   GERD -PPI   OSA - BiPAP PRN for now as above   Obesity -BMI 41.65   Scheduled Meds:  apixaban  10 mg Oral BID   Followed by   Melene Muller ON 01/03/2023] apixaban  5 mg Oral BID   arformoterol  15 mcg Nebulization BID   budesonide (PULMICORT) nebulizer solution  0.5 mg Nebulization BID   Chlorhexidine Gluconate Cloth  6 each Topical Daily   furosemide  40 mg Oral Daily   ipratropium-albuterol  3 mL Nebulization TID   mouth rinse  15 mL Mouth Rinse 4 times per day   pantoprazole  40 mg Oral QAC breakfast   Continuous Infusions:   PRN Meds:.hydrOXYzine, mouth rinse, polyvinyl alcohol  Current Outpatient Medications  Medication Instructions   albuterol (PROVENTIL) 2.5 mg, Nebulization, Every 6 hours PRN   aspirin EC 325 mg, Oral, Daily   Budeson-Glycopyrrol-Formoterol (BREZTRI AEROSPHERE) 160-9-4.8 MCG/ACT AERO 2 puffs, Inhalation, 2 times  daily   cetirizine (ZYRTEC) 10 mg, Oral, As needed   Chlorphen-PE-Acetaminophen (NOREL AD) 4-10-325 MG TABS Oral, 2 times daily between meals and at bedtime PRN   famotidine (PEPCID) 20 MG tablet One after supper   fluticasone (FLONASE) 50 MCG/ACT nasal spray 1 spray, Each Nare, Daily   furosemide (LASIX) 40 mg, Oral, Daily   ibuprofen (ADVIL) 800 mg, Oral, 3 times daily    losartan-hydrochlorothiazide (HYZAAR) 50-12.5 MG tablet TAKE 1 TABLET BY MOUTH EVERY DAY   MITIGARE 0.6 MG CAPS 1 capsule, Oral, 2 times daily PRN   montelukast (SINGULAIR) 10 mg, Oral, Daily at bedtime   pantoprazole (PROTONIX) 40 mg, Oral, Daily before breakfast   potassium chloride (K-DUR) 10 MEQ tablet 10 mEq, Oral, Daily   tadalafil (CIALIS) 20 mg, Oral, Daily PRN   tiZANidine (ZANAFLEX) 4 mg, Oral, Every 6 hours PRN   UNABLE TO FIND Med Name: CPAP with 3lpm o2 <BR>Adapt    Vitamin D (Ergocalciferol) (DRISDOL) 50,000 Units, Oral, Weekly    Diet Orders (From admission, onward)     Start     Ordered   12/24/22 0729  Diet Heart Room service appropriate? Yes; Fluid consistency: Thin  Diet effective now       Question Answer Comment  Room service appropriate? Yes   Fluid consistency: Thin      12/24/22 0728            DVT prophylaxis: Place TED hose Start: 12/23/22 2145 apixaban (ELIQUIS) tablet 10 mg  apixaban (ELIQUIS) tablet 5 mg   Lab Results  Component Value Date   PLT 186 12/28/2022      Code Status: Full Code  Family Communication: no family at bedside   Status is: Inpatient Remains inpatient appropriate because: severity of illness  Level of care: Stepdown  Consultants:  PCCM  Objective: Vitals:   12/28/22 1000 12/28/22 1020 12/28/22 1036 12/28/22 1045  BP: (!) 108/39 107/65    Pulse: (!) 103  (!) 112 (!) 104  Resp: (!) 28  17 17   Temp:      TempSrc:      SpO2: (!) 84%  (!) 85% 92%  Weight:      Height:        Intake/Output Summary (Last 24 hours) at 12/28/2022 1132 Last data filed at 12/28/2022 1000 Gross per 24 hour  Intake 387.43 ml  Output 3800 ml  Net -3412.57 ml    Wt Readings from Last 3 Encounters:  12/23/22 118.8 kg  03/30/21 118.8 kg  05/01/20 124.6 kg    Examination:  Constitutional: NAD Eyes: lids and conjunctivae normal, no scleral icterus ENMT: mmm Neck: normal, supple Respiratory: clear to auscultation bilaterally,  no wheezing, no crackles.  Cardiovascular: Regular rate and rhythm, no murmurs / rubs / gallops.  Abdomen: soft, no distention, no tenderness. Bowel sounds positive.   Data Reviewed: I have independently reviewed following labs and imaging studies   CBC Recent Labs  Lab 12/24/22 0400 12/25/22 0318 12/26/22 0322 12/27/22 0331 12/28/22 0319  WBC 8.7 13.9* 10.9* 11.8* 11.2*  HGB 15.8 15.3 15.6 15.0 14.4  HCT 52.5* 53.4* 56.1* 52.1* 49.1  PLT 178 179 168 180 186  MCV 89.3 93.8 97.9 94.0 91.6  MCH 26.9 26.9 27.2 27.1 26.9  MCHC 30.1 28.7* 27.8* 28.8* 29.3*  RDW 15.7* 15.9* 16.9* 15.7* 15.9*     Recent Labs  Lab 12/23/22 1845 12/23/22 2209 12/25/22 0318 12/26/22 0322 12/27/22 0331 12/28/22 0319  NA 132*  --  136 137 137 135  K 4.1  --  4.8 5.4* 5.0 4.6  CL 92*  --  96* 95* 95* 92*  CO2 32  --  33* 34* 38* 37*  GLUCOSE 112*  --  127* 117* 108* 94  BUN 25*  --  15 12 10 13   CREATININE 1.25*  --  0.96 0.94 0.90 0.79  CALCIUM 8.0*  --  8.1* 8.4* 8.2* 8.5*  AST  --   --   --  27 23 25   ALT  --   --   --  49* 45* 40  ALKPHOS  --   --   --  72 72 61  BILITOT  --   --   --  0.7 0.3 0.6  ALBUMIN  --   --   --  3.0* 3.3* 2.9*  MG  --   --   --  2.3 2.3 2.2  LATICACIDVEN 1.6 1.2  --   --   --   --   BNP 417.8*  --   --   --   --   --      ------------------------------------------------------------------------------------------------------------------ No results for input(s): "CHOL", "HDL", "LDLCALC", "TRIG", "CHOLHDL", "LDLDIRECT" in the last 72 hours.  Lab Results  Component Value Date   HGBA1C 6.8 (H) 10/19/2009   ------------------------------------------------------------------------------------------------------------------ No results for input(s): "TSH", "T4TOTAL", "T3FREE", "THYROIDAB" in the last 72 hours.  Invalid input(s): "FREET3"  Cardiac Enzymes No results for input(s): "CKMB", "TROPONINI", "MYOGLOBIN" in the last 168 hours.  Invalid input(s):  "CK" ------------------------------------------------------------------------------------------------------------------    Component Value Date/Time   BNP 417.8 (H) 12/23/2022 1845    CBG: No results for input(s): "GLUCAP" in the last 168 hours.  Recent Results (from the past 240 hour(s))  Blood culture (routine x 2)     Status: None   Collection Time: 12/23/22  6:45 PM   Specimen: BLOOD RIGHT FOREARM  Result Value Ref Range Status   Specimen Description   Final    BLOOD RIGHT FOREARM Performed at [facility], [address], [place], [place] [address]    Special Requests   Final    BOTTLES DRAWN AEROBIC AND ANAEROBIC Blood Culture results may not be optimal due to an excessive volume of blood received in culture bottles Performed at [facility], [address] [place], [place] [address]    Culture   Final    NO GROWTH 5 DAYS Performed at [facility], [address], [place], [place] [address]    Report Status 12/28/2022 FINAL  Final  Blood culture (routine x 2)     Status: None   Collection Time: 12/23/22  6:58 PM   Specimen: BLOOD RIGHT HAND  Result Value Ref [place] Status   Specimen Description   Final    BLOOD RIGHT HAND Performed at [facility], [address], [place], [place] [address]    Special Requests   Final    BOTTLES DRAWN AEROBIC AND ANAEROBIC Blood Culture results may not be optimal due to an excessive volume of blood received in culture bottles Performed at [facility], [address] [place], [place] [address]    Culture   Final    NO GROWTH 5 DAYS Performed at [facility], [address], [place], [place] [address]    Report Status 12/28/2022 FINAL  Final  MRSA Next Gen by PCR, Nasal     Status: None   Collection Time:  12/24/22  9:16 AM   Specimen: Nasal Mucosa; Nasal Swab  Result Value Ref [place] Status   MRSA by PCR Next Gen NOT DETECTED NOT DETECTED  Final    Comment: (NOTE) The GeneXpert MRSA Assay (FDA approved for NASAL specimens only), is one component of a comprehensive MRSA colonization surveillance program. It is not intended to diagnose MRSA infection nor to guide or monitor treatment for MRSA infections. Test performance is not FDA approved in patients less than 70 years old. Performed at [facility], [address] [place], [place] [address]      Radiology Studies: No results found.   Pamella Pert, MD, PhD [facility]  Between 7 am - 7 pm I am available, please contact me via Amion (for emergencies) or Securechat (non urgent messages)  Between 7 pm - 7 am I am not available, please contact night coverage MD/APP via Amion

## 2022-12-29 DIAGNOSIS — J9621 Acute and chronic respiratory failure with hypoxia: Secondary | ICD-10-CM

## 2022-12-29 DIAGNOSIS — I2781 Cor pulmonale (chronic): Secondary | ICD-10-CM | POA: Diagnosis not present

## 2022-12-29 DIAGNOSIS — I824Y2 Acute embolism and thrombosis of unspecified deep veins of left proximal lower extremity: Secondary | ICD-10-CM

## 2022-12-29 DIAGNOSIS — I2489 Other forms of acute ischemic heart disease: Secondary | ICD-10-CM

## 2022-12-29 DIAGNOSIS — J9622 Acute and chronic respiratory failure with hypercapnia: Secondary | ICD-10-CM

## 2022-12-29 LAB — CBC
HCT: 50.1 % (ref 39.0–52.0)
Hemoglobin: 14.7 g/dL (ref 13.0–17.0)
MCH: 26.6 pg (ref 26.0–34.0)
MCHC: 29.3 g/dL — ABNORMAL LOW (ref 30.0–36.0)
MCV: 90.6 fL (ref 80.0–100.0)
Platelets: 209 10*3/uL (ref 150–400)
RBC: 5.53 MIL/uL (ref 4.22–5.81)
RDW: 16 % — ABNORMAL HIGH (ref 11.5–15.5)
WBC: 9 10*3/uL (ref 4.0–10.5)
nRBC: 0 % (ref 0.0–0.2)

## 2022-12-29 MED ORDER — OLOPATADINE HCL 0.1 % OP SOLN
1.0000 [drp] | Freq: Two times a day (BID) | OPHTHALMIC | Status: DC
  Administered 2022-12-29 – 2022-12-31 (×6): 1 [drp] via OPHTHALMIC
  Filled 2022-12-29: qty 5

## 2022-12-29 MED ORDER — MONTELUKAST SODIUM 10 MG PO TABS
10.0000 mg | ORAL_TABLET | Freq: Every day | ORAL | Status: DC
  Administered 2022-12-29 – 2022-12-31 (×3): 10 mg via ORAL
  Filled 2022-12-29 (×4): qty 1

## 2022-12-29 MED ORDER — ORAL CARE MOUTH RINSE: 15.0000 mL | OROMUCOSAL | Status: DC | PRN

## 2022-12-29 NOTE — Progress Notes (Signed)
  Progress Note   Patient: Kurt [name] UJW:[id number] DOB: [birth year] DOA: 12/23/2022     6 DOS: the patient was seen and examined on 12/29/2022   Brief hospital course: 62 year old man PMH including chronic hypoxic respiratory failure on 3 L nasal cannula, pulmonary hypertension, obstructive sleep apnea, probable chronic cor pulmonale, presented with shortness of breath.  Admitted for submassive PE and left lower extremity DVT, acute on chronic hypoxic and hypercapnic respiratory failure.  Assessment and Plan: Unprovoked submassive PE  LLE DVT   Acute on chronic cor pulmonale Echocardiogram shows EF 70 to 75%, grade 1 diastolic dysfunction, elevated right ventricular pressure overload, consistent with cor pulmonale Hemodynamic stable.  See by pulmonology.  Continue anticoagulation  Acute on chronic hypoxemic and hypercapnic respiratory failure secondary to PE Acute respiratory acidosis Chronic hypoxic respiratory failure OSA Uses 3 L nasal cannula O2 at baseline, however he is noncompliant with that and has not had any oxygen in the last several months.   Continues to require oxygen above current requirement.  Was refusing nasal cannula, using Ventimask. Weaned to nasal cannula. Has been tolerating home BiPAP last 2 nights. Outpatient follow-up with pulmonology. Home when oxygenation stable on 3 to 5 L nasal cannula  Demand ischemia  Secondary to above, troponin 43, 42   Essential hypertension  Stable. Holding home losartan, HCTZ   GERD  PPI   Morbid Obesity  Body mass index is 41.65 kg/m.  Aortic atherosclerosis     Subjective:  Per RN: "Patient wearing his venturi mask and O2 dropping to 65-70's. RT was called but patient refused to adjust his O2 and wear his home CPAP. This RN educated the patient and kept remind him to keep his mask on after he drinks and eat.   Physical Exam: Vitals:   12/29/22 1140 12/29/22 1200 12/29/22 1400 12/29/22 1453  BP:  122/72 (!)  165/83   Pulse: (!) 114 (!) 109 (!) 106   Resp: 18 17 (!) 24   Temp:      TempSrc:      SpO2: 90% 91% 90% 90%  Weight:      Height:       Physical Exam Vitals reviewed.  Constitutional:      General: He is not in acute distress.    Appearance: He is not ill-appearing or toxic-appearing.  Cardiovascular:     Rate and Rhythm: Normal rate and regular rhythm.     Heart sounds: No murmur heard. Pulmonary:     Effort: Pulmonary effort is normal. No respiratory distress.     Breath sounds: No wheezing, rhonchi or rales.  Neurological:     Mental Status: He is alert.  Psychiatric:        Mood and Affect: Mood normal.        Behavior: Behavior normal.     Data Reviewed: CBC noted  Family Communication: none  Disposition: Status is: Inpatient Remains inpatient appropriate because: hypoxia  Planned Discharge Destination: Home    Time spent: 35 minutes  Author: Brendia Sacks, MD 12/29/2022 5:15 PM  For on call review [email or web address].

## 2022-12-29 NOTE — Hospital Course (Addendum)
62 year old man PMH including chronic hypoxic respiratory failure on 3 L nasal cannula, pulmonary hypertension, obstructive sleep apnea, probable chronic cor pulmonale, presented with shortness of breath.  Admitted for submassive PE and left lower extremity DVT, acute on chronic hypoxic and hypercapnic respiratory failure.  Consultants PCCM  Procedures None

## 2022-12-29 NOTE — Progress Notes (Signed)
Patient wearing his venturi mask and O2 dropping to 65-70's. RT was called but patient refused to adjust his O2 and wear his home CPAP. This RN educated the patient and kept remind him to keep his mask on after he drinks and eat.

## 2022-12-30 DIAGNOSIS — J9622 Acute and chronic respiratory failure with hypercapnia: Secondary | ICD-10-CM | POA: Diagnosis not present

## 2022-12-30 DIAGNOSIS — J9621 Acute and chronic respiratory failure with hypoxia: Secondary | ICD-10-CM | POA: Diagnosis not present

## 2022-12-30 DIAGNOSIS — I2609 Other pulmonary embolism with acute cor pulmonale: Secondary | ICD-10-CM | POA: Diagnosis not present

## 2022-12-30 LAB — CBC
HCT: 48.9 % (ref 39.0–52.0)
Hemoglobin: 14.2 g/dL (ref 13.0–17.0)
MCH: 26.7 pg (ref 26.0–34.0)
MCHC: 29 g/dL — ABNORMAL LOW (ref 30.0–36.0)
MCV: 91.9 fL (ref 80.0–100.0)
Platelets: 193 10*3/uL (ref 150–400)
RBC: 5.32 MIL/uL (ref 4.22–5.81)
RDW: 16.3 % — ABNORMAL HIGH (ref 11.5–15.5)
WBC: 8.9 10*3/uL (ref 4.0–10.5)
nRBC: 0 % (ref 0.0–0.2)

## 2022-12-30 MED ORDER — IPRATROPIUM-ALBUTEROL 0.5-2.5 (3) MG/3ML IN SOLN
3.0000 mL | Freq: Two times a day (BID) | RESPIRATORY_TRACT | Status: DC
  Administered 2022-12-30 – 2022-12-31 (×2): 3 mL via RESPIRATORY_TRACT
  Filled 2022-12-30 (×4): qty 3

## 2022-12-30 NOTE — Progress Notes (Signed)
Pt on home bipap and states he is able to place self on machine when ready for bed.

## 2022-12-30 NOTE — Progress Notes (Signed)
PT declined scheduled nebulizer treatment at 0830. PT in bedside chair eating breakfast and states his breathing "is fine". RN aware and medications have been rescheduled for later this morning.

## 2022-12-30 NOTE — Progress Notes (Signed)
  Progress Note   Patient: Kurt [name] [id number] DOB: 12-Apr-1961 DOA: 12/23/2022     7 DOS: the patient was seen and examined on 12/30/2022   Brief hospital course: 62 year old man PMH including chronic hypoxic respiratory failure on 3 L nasal cannula, pulmonary hypertension, obstructive sleep apnea, probable chronic cor pulmonale, presented with shortness of breath.  Admitted for submassive PE and left lower extremity DVT, acute on chronic hypoxic and hypercapnic respiratory failure.  Consultants PCCM  Procedures None  Assessment and Plan: Unprovoked submassive PE  LLE DVT   Acute on chronic cor pulmonale Echocardiogram shows EF 70 to 75%, grade 1 diastolic dysfunction, elevated right ventricular pressure overload, consistent with cor pulmonale Hemodynamics stable.  See by pulmonology.  Continue anticoagulation   Acute on chronic hypoxemic and hypercapnic respiratory failure secondary to PE Acute respiratory acidosis Chronic hypoxic respiratory failure OSA Uses 3 L nasal cannula O2 at baseline, however he is noncompliant with that and has not had any oxygen in the last several months.   Continues to require oxygen above current requirement.   Weaned to nasal cannula now. Has been tolerating home BiPAP  Outpatient follow-up with pulmonology. Home when oxygenation stable on 3 to 5 L nasal cannula   Demand ischemia  Secondary to above, troponin 43, 42   Essential hypertension  Stable. Holding home losartan, HCTZ   GERD  PPI   Morbid Obesity  Body mass index is 41.65 kg/m.   Aortic atherosclerosis       Subjective:   Per RT AM: "  PT declined scheduled nebulizer treatment at 0830. PT in bedside chair eating breakfast and states his breathing "is fine". RN aware and medications have been rescheduled for later this morning.   Feels better, now off mask, tolerating [place] [facility]  Physical Exam: Vitals:   12/30/22 0735 12/30/22 0800 12/30/22 0845 12/30/22 0852  BP:   101/62    Pulse: (!) 101 (!) 102 (!) 102 (!) 103  Resp: (!) 24 (!) 24 20 (!) 25  Temp: (!) 97.5 F (36.4 C) (!) 97.5 F (36.4 C)    TempSrc: Oral Oral    SpO2: 92% 92% 91% 93%  Weight:      Height:       Physical Exam Vitals reviewed.  Constitutional:      General: He is not in acute distress.    Appearance: He is not ill-appearing or toxic-appearing.     Comments: Sitting in chair  Cardiovascular:     Rate and Rhythm: Regular rhythm. Tachycardia present.     Heart sounds: No murmur heard. Pulmonary:     Effort: Pulmonary effort is normal. No respiratory distress.     Breath sounds: No wheezing, rhonchi or rales.  Musculoskeletal:     Right lower leg: Edema present.     Left lower leg: Edema present.  Neurological:     Mental Status: He is alert.  Psychiatric:        Mood and Affect: Mood normal.        Behavior: Behavior normal.    Tachycardic RR 20s HFNC 8L  Data Reviewed: CBC noted  Family Communication: none  Disposition: Status is: Inpatient Remains inpatient appropriate because: hypoxia  Planned Discharge Destination: Home    Time spent: 20 minutes  Author: Brendia Sacks, MD 12/30/2022 9:59 AM  For on call review [email or web address].

## 2022-12-30 NOTE — TOC Progression Note (Signed)
Transition of Care [facility]) - Progression Note    Patient Details  Name: Kurt [name] MRN: [id number] Date of Birth: April 01, 1961  Transition of Care [facility]) CM/SW Contact  Armanda Heritage, RN Phone Number: 12/30/2022, 1:52 PM  Clinical Narrative:    Patient needs DME rolling walker with seat, [employer] rep [name] accepts referral and will deliver to bedside for home use.     Barriers to Discharge: No Home Care Agency will accept this patient  Expected Discharge Plan and Services                         DME Arranged: Walker rolling with seat DME Agency: Other - Comment Loyal Buba) Date DME Agency Contacted: 12/30/22 Time DME Agency Contacted: [phone] Representative spoke with at DME Agency: Vaughan Basta             Social Determinants of Health (SDOH) Interventions SDOH Screenings   Food Insecurity: No Food Insecurity (12/24/2022)  Housing: Patient Declined (12/24/2022)  Transportation Needs: Unmet Transportation Needs (12/24/2022)  Utilities: Not At Risk (12/24/2022)  Tobacco Use: Medium Risk (12/23/2022)    Readmission Risk Interventions    12/27/2022    1:45 PM  Readmission Risk Prevention Plan  Post Dischage Appt Complete  Medication Screening Complete  Transportation Screening Complete

## 2022-12-30 NOTE — Progress Notes (Signed)
Physical Therapy Treatment Patient Details Name: Kurt [name] MRN: [id number] DOB: March 06, 1961 Today's Date: 12/30/2022   History of Present Illness Kurt [name] is a 62 y.o. male with medical history significant of depression, GERD, COPD, heart failure with preserved ejection fraction, pulmonary hypertension, obstructive sleep apnea who presents emergency department 6/17 /24with progressively worsening shortness of breath.CT pulmonary embolism study demonstrated extensive pulm embolism in the right pulmonary artery with evidence of right heart strain, LLE DVT.    PT Comments    Pt is progressing well with mobility, he ambulated 200' with RW, no loss of balance, SpO2 90-93% on 6L O2 walking at a slow pace (86% walking at a faster pact), advised pt to keep pace slow. HR 113-116 walking. VCs for pursed lip breathing.     Recommendations for follow up therapy are one component of a multi-disciplinary discharge planning process, led by the attending physician.  Recommendations may be updated based on patient status, additional functional criteria and insurance authorization.  Follow Up Recommendations       Assistance Recommended at Discharge Set up Supervision/Assistance  Patient can return home with the following Assistance with cooking/housework;Assist for transportation;Help with stairs or ramp for entrance   Equipment Recommendations  Rollator (4 wheels)    Recommendations for Other Services       Precautions / Restrictions Precautions Precautions: Fall Precaution Comments: monitor sats, HR Restrictions Weight Bearing Restrictions: No     Mobility  Bed Mobility               General bed mobility comments: up in recliner    Transfers Overall transfer level: Modified independent Equipment used: Rolling walker (2 wheels) Transfers: Sit to/from Stand Sit to Stand: Modified independent (Device/Increase time)                Ambulation/Gait Ambulation/Gait  assistance: Modified independent (Device/Increase time) Gait Distance (Feet): 200 Feet Assistive device: Rolling walker (2 wheels) Gait Pattern/deviations: WFL(Within Functional Limits) Gait velocity: decr     General Gait Details: steady, no loss of balance, SpO2 90-93% on 6L O2 walking, HR 113-116, SpO2 dropped to 86% when pt was talking and walking at a faster pace but was 90-93% at a slow pace with no talking   Stairs             Wheelchair Mobility    Modified Rankin (Stroke Patients Only)       Balance Overall balance assessment: Modified Independent                                          Cognition Arousal/Alertness: Awake/alert Behavior During Therapy: WFL for tasks assessed/performed Overall Cognitive Status: Within Functional Limits for tasks assessed                                          Exercises      General Comments        Pertinent Vitals/Pain Pain Assessment Pain Assessment: No/denies pain    Home Living                          Prior Function            PT Goals (current goals can now be found in the care plan section)  Acute Rehab PT Goals Patient Stated Goal: go home, 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 PT Goal Formulation: With patient Time For Goal Achievement: 01/09/23 Potential to Achieve Goals: Good Progress towards PT goals: Progressing toward goals    Frequency    Min 1X/week      PT Plan Current plan remains appropriate    Co-evaluation              AM-PAC PT "6 Clicks" Mobility   Outcome Measure  Help needed turning from your back to your side while in a flat bed without using bedrails?: None Help needed moving from lying on your back to sitting on the side of a flat bed without using bedrails?: None Help needed moving to and from a bed to a chair (including a wheelchair)?: None Help needed standing up from a chair using  your arms (e.g., wheelchair or bedside chair)?: None Help needed to walk in hospital room?: None Help needed climbing 3-5 steps with a railing? : A Little 6 Click Score: 23    End of Session Equipment Utilized During Treatment: Oxygen Activity Tolerance: Patient tolerated treatment well Patient left: in chair;with call bell/phone within reach Nurse Communication: Mobility status PT Visit Diagnosis: Unsteadiness on feet (R26.81);Difficulty in walking, not elsewhere classified (R26.2)     Time: 0272-5366 PT Time Calculation (min) (ACUTE ONLY): 32 min  Charges:  $Gait Training: 23-37 mins                     Ralene Bathe Kistler PT 12/30/2022  Acute Rehabilitation Services  Office [phone]

## 2022-12-30 NOTE — Progress Notes (Signed)
   12/30/22 2330  BiPAP/CPAP/SIPAP  BiPAP/CPAP/SIPAP Pt Type Adult  BiPAP/CPAP/SIPAP Resmed  Mask Type Full face mask  Mask Size Large  Patient Home Equipment Yes

## 2022-12-31 DIAGNOSIS — J9622 Acute and chronic respiratory failure with hypercapnia: Secondary | ICD-10-CM | POA: Diagnosis not present

## 2022-12-31 DIAGNOSIS — I824Y2 Acute embolism and thrombosis of unspecified deep veins of left proximal lower extremity: Secondary | ICD-10-CM | POA: Diagnosis not present

## 2022-12-31 DIAGNOSIS — J9621 Acute and chronic respiratory failure with hypoxia: Secondary | ICD-10-CM | POA: Diagnosis not present

## 2022-12-31 DIAGNOSIS — I2609 Other pulmonary embolism with acute cor pulmonale: Secondary | ICD-10-CM | POA: Diagnosis not present

## 2022-12-31 LAB — CBC
HCT: 52 % (ref 39.0–52.0)
Hemoglobin: 15.1 g/dL (ref 13.0–17.0)
MCH: 26.7 pg (ref 26.0–34.0)
MCHC: 29 g/dL — ABNORMAL LOW (ref 30.0–36.0)
MCV: 92 fL (ref 80.0–100.0)
Platelets: 225 10*3/uL (ref 150–400)
RBC: 5.65 MIL/uL (ref 4.22–5.81)
RDW: 16.1 % — ABNORMAL HIGH (ref 11.5–15.5)
WBC: 8.5 10*3/uL (ref 4.0–10.5)
nRBC: 0 % (ref 0.0–0.2)

## 2022-12-31 MED ORDER — ORAL CARE MOUTH RINSE: 15.0000 mL | OROMUCOSAL | Status: DC | PRN

## 2022-12-31 NOTE — Progress Notes (Signed)
  Progress Note   Patient: Kurt [name] WGN:[id number] DOB: August 31, 1960 DOA: 12/23/2022     8 DOS: the patient was seen and examined on 12/31/2022   Brief hospital course: 62 year old man PMH including chronic hypoxic respiratory failure on 3 L nasal cannula, pulmonary hypertension, obstructive sleep apnea, probable chronic cor pulmonale, presented with shortness of breath.  Admitted for submassive PE and left lower extremity DVT, acute on chronic hypoxic and hypercapnic respiratory failure.  Condition has been slow to improve, still hypoxic above baseline.  Once oxygenation stable on 5 L, can discharge home.  Consultants PCCM  Procedures None  Assessment and Plan: Unprovoked submassive PE  LLE DVT   Acute on chronic cor pulmonale Echocardiogram showed EF 70 to 75%, grade 1 diastolic dysfunction, elevated right ventricular pressure overload, consistent with cor pulmonale Hemodynamics stable.  Seen by pulmonology.  Continue anticoagulation   Acute on chronic hypoxemic and hypercapnic respiratory failure secondary to PE Acute respiratory acidosis Chronic hypoxic respiratory failure OSA Uses 3 L nasal cannula O2 at baseline, however he is noncompliant with that and has not had any oxygen in the last several months.   Continues to require oxygen above current requirement, currently on 6L. Wean to 5L, then can go home. Has been tolerating home BiPAP  Outpatient follow-up with pulmonology recommended on discharge.   Demand ischemia  Secondary to above, troponin 43, 42   Essential hypertension  Stable. Holding home losartan, HCTZ   GERD  PPI   Morbid Obesity  Body mass index is 41.65 kg/m.   Aortic atherosclerosis       Subjective:  Feels better  Physical Exam: Vitals:   12/31/22 0800 12/31/22 0900 12/31/22 1200 12/31/22 1216  BP:   117/71   Pulse: 96 (!) 108    Resp: 16 (!) 24    Temp: 98.2 F (36.8 C)  (!) 97.4 F (36.3 C) 98 F (36.7 C)  TempSrc: Axillary   Oral   SpO2: 92% (!) 84% 94%   Weight:      Height:       Physical Exam Vitals reviewed.  Constitutional:      General: He is not in acute distress.    Appearance: He is not ill-appearing or toxic-appearing.  Cardiovascular:     Rate and Rhythm: Normal rate and regular rhythm.     Heart sounds: No murmur heard. Pulmonary:     Effort: Pulmonary effort is normal. No respiratory distress.     Breath sounds: No wheezing, rhonchi or rales.  Musculoskeletal:     Right lower leg: Edema present.     Left lower leg: Edema present.  Neurological:     Mental Status: He is alert.  Psychiatric:        Mood and Affect: Mood normal.        Behavior: Behavior normal.     Data Reviewed: CBC WNL  Family Communication: none  Disposition: Status is: Inpatient Remains inpatient appropriate because: acute on chronic hypoxia  Planned Discharge Destination: Home    Time spent: 20 minutes  Author: Brendia Sacks, MD 12/31/2022 1:14 PM  For on call review [email or web address].

## 2022-12-31 NOTE — Progress Notes (Signed)
PT sitting in bedside chair eating breakfast. PT states he is breathing fine at this time. RT will attempt to revisit later this morning. RN aware.

## 2022-12-31 NOTE — Progress Notes (Signed)
   12/31/22 2038  BiPAP/CPAP/SIPAP  BiPAP/CPAP/SIPAP Pt Type Adult (pt declined assistance, prefers /comfortable with self-placement of home bipap)  BiPAP/CPAP/SIPAP Resmed  Mask Type Full face mask  Flow Rate 5 lpm  Heater Temperature  (humidifier already full per pt)  Patient Home Equipment Yes (no frays on cord, machine plugged into red outlet)  Auto Titrate No  Safety Check Completed by RT for Home Unit Yes, no issues noted

## 2022-12-31 NOTE — Progress Notes (Signed)
Attempted to provide scheduled nebulizer treatment- PT refused and wishes to go on home BiPAP unit. PT self placed BiPAP unit. RT ensured PT had 5 LPM supplemental oxygen. RN aware.

## 2022-12-31 NOTE — Progress Notes (Signed)
Third attempt to administer scheduled nebulizer medications- PT refused. RN aware.

## 2023-01-01 DIAGNOSIS — I2609 Other pulmonary embolism with acute cor pulmonale: Secondary | ICD-10-CM

## 2023-01-01 LAB — BASIC METABOLIC PANEL
Anion gap: 4 — ABNORMAL LOW (ref 5–15)
BUN: 13 mg/dL (ref 6–20)
CO2: 38 mmol/L — ABNORMAL HIGH (ref 22–32)
Calcium: 8.5 mg/dL — ABNORMAL LOW (ref 8.9–10.3)
Chloride: 91 mmol/L — ABNORMAL LOW (ref 98–111)
Creatinine, Ser: 0.82 mg/dL (ref 0.61–1.24)
GFR, Estimated: >60 mL/min (ref >60)
Glucose, Bld: 92 mg/dL (ref 70–99)
Potassium: 4.6 mmol/L (ref 3.5–5.1)
Sodium: 133 mmol/L — ABNORMAL LOW (ref 135–145)

## 2023-01-01 MED ORDER — APIXABAN 5 MG PO TABS: ORAL_TABLET | ORAL | 1 refills | Status: AC

## 2023-01-01 NOTE — Progress Notes (Signed)
Physical Therapy Treatment Patient Details Name: Kurt [name] MRN: [id number] DOB: 10-Oct-1960 Today's Date: 01/01/2023   History of Present Illness Kurt [name] is a 62 y.o. male with medical history significant of depression, GERD, COPD, heart failure with preserved ejection fraction, pulmonary hypertension, obstructive sleep apnea who presents emergency department 6/17 /24with progressively worsening shortness of breath.CT pulmonary embolism study demonstrated extensive pulm embolism in the right pulmonary artery with evidence of right heart strain, LLE DVT.    PT Comments    Patient is modified Independent with ambulation, does require assistance with equipment  for saturation  walk test with RN. Patient was tested I  HFNC at 4 L. See RN note.  Patient declines need for HHPT at this time.  Recommendations for follow up therapy are one component of a multi-disciplinary discharge planning process, led by the attending physician.  Recommendations may be updated based on patient status, additional functional criteria and insurance authorization.  Follow Up Recommendations       Assistance Recommended at Discharge Set up Supervision/Assistance  Patient can return home with the following Assistance with cooking/housework;Assist for transportation;Help with stairs or ramp for entrance   Equipment Recommendations  None recommended by PT    Recommendations for Other Services       Precautions / Restrictions Precautions Precautions: Fall Precaution Comments: monitor sats, HR     Mobility  Bed Mobility Overal bed mobility: Modified Independent                  Transfers Overall transfer level: Modified independent Equipment used: Rolling walker (2 wheels)   Sit to Stand: Modified independent (Device/Increase time)                Ambulation/Gait Ambulation/Gait assistance: Modified independent (Device/Increase time) (asssited with O2 and monitor) Gait Distance  (Feet): 200 Feet Assistive device: Rolling walker (2 wheels) Gait Pattern/deviations: WFL(Within Functional Limits)       General Gait Details: ambulated on 4 L( on 5 L in room, there is no 5 L on tank).   Stairs             Wheelchair Mobility    Modified Rankin (Stroke Patients Only)       Balance Overall balance assessment: Modified Independent                                          Cognition Arousal/Alertness: Awake/alert Behavior During Therapy: WFL for tasks assessed/performed Overall Cognitive Status: Within Functional Limits for tasks assessed                                 General Comments: expresses concern that the monitor is not reading SPO2 correctlly, comparing numbers to his own personal oximeter.        Exercises      General Comments        Pertinent Vitals/Pain Pain Assessment Pain Assessment: No/denies pain    Home Living                          Prior Function            PT Goals (current goals can now be found in the care plan section) Progress towards PT goals: Progressing toward goals    Frequency    Min 1X/week  PT Plan Current plan remains appropriate    Co-evaluation              AM-PAC PT "6 Clicks" Mobility   Outcome Measure  Help needed turning from your back to your side while in a flat bed without using bedrails?: None Help needed moving from lying on your back to sitting on the side of a flat bed without using bedrails?: None Help needed moving to and from a bed to a chair (including a wheelchair)?: None Help needed standing up from a chair using your arms (e.g., wheelchair or bedside chair)?: None Help needed to walk in hospital room?: None Help needed climbing 3-5 steps with a railing? : A Little 6 Click Score: 23    End of Session Equipment Utilized During Treatment: Oxygen Activity Tolerance: Patient tolerated treatment well Patient left: in  chair;with call bell/phone within reach Nurse Communication: Mobility status PT Visit Diagnosis: Unsteadiness on feet (R26.81);Difficulty in walking, not elsewhere classified (R26.2)     Time: 3086-5784 PT Time Calculation (min) (ACUTE ONLY): 46 min  Charges:  $Gait Training: 23-37 mins $Self Care/Home Management: 8-22                    Blanchard Kelch PT Acute Rehabilitation Services Office [phone] Weekend pager-[phone]    Rada Hay 01/01/2023, 1:16 PM

## 2023-01-01 NOTE — TOC Transition Note (Signed)
Transition of Care [facility]) - CM/SW Discharge Note   Patient Details  Name: Kurt [name] MRN: [id number] Date of Birth: 25-May-1961  Transition [facility]) CM/SW Contact:  Otelia Santee, LCSW Phone Number: 01/01/2023, 12:26 PM   Clinical Narrative:    Pt to return home at discharge. Pt on 3L O2 at baseline. [employer] to bring portable tank to pt's room for travel home. Contacted [employer] for update on RW. RW to be delivered to pt's room prior to discharge. PT has CPAP and BiPAP machine. Pt to use BiPAP at discharge.  TOC signing off at this time.    Final next level of care: Home/Self Care Barriers to Discharge: No Barriers Identified   Patient Goals and CMS Choice CMS Medicare.gov Compare Post Acute Care list provided to:: Patient Choice offered to / list presented to : Patient  Discharge Placement                         Discharge Plan and Services Additional resources added to the After Visit Summary for                  DME Arranged: Walker rolling with seat DME Agency: Other - Comment Loyal Buba) Date DME Agency Contacted: 12/30/22 Time DME Agency Contacted: [phone] Representative spoke with at DME Agency: Vaughan Basta            Social Determinants of Health (SDOH) Interventions SDOH Screenings   Food Insecurity: No Food Insecurity (12/24/2022)  Housing: Patient Declined (12/24/2022)  Transportation Needs: Unmet Transportation Needs (12/24/2022)  Utilities: Not At Risk (12/24/2022)  Tobacco Use: Medium Risk (12/23/2022)     Readmission Risk Interventions    12/27/2022    1:45 PM  Readmission Risk Prevention Plan  Post Dischage Appt Complete  Medication Screening Complete  Transportation Screening Complete

## 2023-01-01 NOTE — Final Progress Note (Signed)
Pt discharged on 6/26, pt has portable o2 to go with him, pt has home o2 at home and will be using that once o2 tank runs out. Pt will f/u with pcm and pulmonology, medication reviewed, informed pt about medication given today. Pt vital stable, spo2 stable 88-92%, on 5L [place], home belongings went with patient. Pt drove self and will be driving back home, pt feels safe to drive home, no concerns. Pt is stable on feet, denies sob, or any abnormal symptoms.

## 2023-01-01 NOTE — Plan of Care (Signed)

## 2023-01-01 NOTE — Plan of Care (Signed)

## 2023-01-01 NOTE — Discharge Summary (Signed)
Physician Discharge Summary  Kurt [name] PPI:[id number] DOB: Jul 01, 1961  PCP: Fleet Contras, MD  Admitted from: Home Discharged to: Home  Admit date: 12/23/2022 Discharge date: 01/01/2023  Recommendations for Outpatient Follow-up:    Follow-up Information     Fleet Contras, MD. Schedule an appointment as soon as possible for a visit on 01/02/2023.   Specialty: Internal Medicine Why: Patient reports that he has an appointment to see his family physician on Thursday 6/27 at 11:30 AM.  Recommend repeating labs (CBC & CMP) in 5 to 7 days from hospital discharge.  Recommend outpatient hematology consultation regarding further evaluation and management of hypercoagulable state. Contact information: [address] [place] [address] [phone]                  Home Health: Home Health Orders (From admission, onward)     Start     Ordered   01/01/23 1018  Home Health  At discharge       Question:  To provide the following care/treatments  Answer:  PT   01/01/23 1025             Equipment/Devices:     Durable Medical Equipment  (From admission, onward)           Start     Ordered   01/01/23 1019  DME Walker  Once       Comments: As per PT recommendations, Rollator (4 wheels)  Question Answer Comment  Walker: With 5 Inch Wheels   Patient needs a walker to treat with the following condition Physical deconditioning      01/01/23 1025   12/30/22 1116  For home use only DME 4 wheeled rolling walker with seat  Once       Question:  Patient needs a walker to treat with the following condition  Answer:  Difficulty in walking, not elsewhere classified   12/30/22 1115             Discharge Condition: Improved and stable.   Code Status: Full Code Diet recommendation:  Discharge Diet Orders (From admission, onward)     Start     Ordered   01/01/23 0000  Diet - low sodium heart healthy        01/01/23 1025             Discharge Diagnoses:   Principal Problem:   Acute pulmonary embolism (HCC) Active Problems:   OSA on CPAP   Essential hypertension   Acute on chronic respiratory failure with hypoxia and hypercapnia (HCC)   Morbid obesity due to excess calories (HCC)   GERD without esophagitis   Cor pulmonale (HCC)   DVT (deep venous thrombosis) (HCC)   Demand ischemia   Brief Summary: 62 year old male, lives with his daughter, has a cane and a walker that he uses occasionally, medical history significant for chronic respiratory failure with hypoxia and unusual 3 l/min home oxygen continuously, OSA on nightly BiPAP, probable chronic pulmonary hypertension" pulmonology, presented to the ED with shortness of breath.  Admitted to stepdown unit for submassive PE, left lower extremity acute DVT, acute on chronic hypoxic and hypercapnic respiratory failure.  PCCM consulted.  Assessment and Plan: Unprovoked submassive PE  LLE DVT   Acute on chronic cor pulmonale Echocardiogram showed EF 70 to 75%, grade 1 diastolic dysfunction, elevated right ventricular pressure overload, consistent with cor pulmonale Briefly on IV heparin infusion and then transitioned to Eliquis. No prior personal or family history of VTE, long distance travel,  cancer diagnosis etc.  Etiologies unclear.  Thereby duration of anticoagulation may be prolonged/lifelong even but this needs to be further evaluated and clarified as outpatient.  Recommend outpatient hematology consultation for further evaluation and management of hypercoagulable state. Hemodynamics stable.  Seen by pulmonology.  Continue anticoagulation On day of discharge noted that his ABG from 6/21 had uncorrected respiratory acidosis: pH 7.24, pCO2 104, pO2 53, HCO3 45 and oxygen saturation 86%.  I discussed with PCCM MD on call for [facility], ICU who recommended repeating an ABG now and if still significant hypercapnia and low pH, recommended trial of Diamox 250 Mg daily for 2 to 3 days with follow-up  of ABG.  Discussed this in detail with patient but patient insistent on being discharged today despite being made aware of risks of unaddressed hypercapnia and respiratory acidosis.  He reported that his breathing was quite at his baseline and he really did not see a reason to do any further testing and wished to be discharged.  He was advised to seek immediate medical attention if any decline in his medical condition and he verbalized understanding.   Acute on chronic hypoxemic and hypercapnic respiratory failure secondary to PE Acute respiratory acidosis OSA Uses 3 L nasal cannula O2 at baseline, however he is noncompliant with that and has not had any oxygen in the last several months.  He recently had to increase this to 5 L/min continuously.  He was unsure if he used CPAP or BiPAP because his machine had both settings.  TOC was able to verify that he truly had both settings and he was advised to use BiPAP. Required up to 10 L/min [place] oxygen, down to 5 L/min on day of discharge. Qualified for home oxygen based on criteria noted below. Has been tolerating home BiPAP but does not appear to be using it still as he should.  Patient reports that he usually works the third shift, mostly awake during the night and hence does not use his BiPAP at night, sleeps during the days when he uses his BiPAP.  Advised to use it when he sleeps and when he is taking naps.  He verbalizes understanding. Outpatient follow-up with pulmonology recommended on discharge.  Patient has a follow-up appointment with Dr. Sherene Sires on 7/12.   SATURATION QUALIFICATIONS: (This note is used to comply with regulatory documentation for home oxygen) Patient Saturations on Room Air at Rest = 85% Patient Saturations on Room Air while Ambulating = 81% Patient Saturations on 4 Liters of oxygen while Ambulating = 89%  Demand ischemia  Secondary to above, troponin 43, 42 No angina symptoms   Essential hypertension  Controlled or even soft at  times.  Holding home losartan and HCTZ to avoid hemodynamic compromise in the setting of submassive PE.  Continuing home dose of furosemide. Close outpatient follow-up with PCP and can consider resuming antihypertensives gradually.   GERD  PPI   Morbid Obesity  Body mass index is 41.65 kg/m. Complicates care.  Outpatient follow-up and management.   Aortic atherosclerosis   Consultations: PCCM  Procedures: BiPAP   Discharge Instructions  Discharge Instructions     (HEART FAILURE PATIENTS) Call MD:  Anytime you have any of the following symptoms: 1) 3 pound weight gain in 24 hours or 5 pounds in 1 week 2) shortness of breath, with or without a dry hacking cough 3) swelling in the hands, feet or stomach 4) if you have to sleep on extra pillows at night in order to breathe.  Complete by: As directed    Call MD for:  difficulty breathing, headache or visual disturbances   Complete by: As directed    Call MD for:  extreme fatigue   Complete by: As directed    Call MD for:  persistant dizziness or light-headedness   Complete by: As directed    Call MD for:  persistant nausea and vomiting   Complete by: As directed    Call MD for:  severe uncontrolled pain   Complete by: As directed    Call MD for:  temperature >100.4   Complete by: As directed    Diet - low sodium heart healthy   Complete by: As directed    Discharge instructions   Complete by: As directed    Continue to consistently use BiPAP every night while sleeping and during the daytime while taking naps.  Also continue to use 24/7 home oxygen via nasal cannula at 4 to 5 L/min continuously.   Increase activity slowly   Complete by: As directed         Medication List     STOP taking these medications    aspirin EC 325 MG tablet   fluticasone 50 MCG/ACT nasal spray Commonly known as: FLONASE   ibuprofen 800 MG tablet Commonly known as: ADVIL   losartan-hydrochlorothiazide 50-12.5 MG tablet Commonly known  as: HYZAAR   Norel AD 4-10-325 MG Tabs Generic drug: Chlorphen-PE-Acetaminophen   potassium chloride 10 MEQ tablet Commonly known as: KLOR-CON       TAKE these medications    albuterol (2.5 MG/3ML) 0.083% nebulizer solution Commonly known as: PROVENTIL Take 3 mLs (2.5 mg total) by nebulization every 6 (six) hours as needed for wheezing or shortness of breath.   apixaban 5 MG Tabs tablet Commonly known as: ELIQUIS Take 2 tabs (10 mg total) twice daily through 01/02/2023 and then from 01/03/2023, start taking 1 tab (5 mg total) twice daily.   cetirizine 10 MG tablet Commonly known as: ZYRTEC Take 10 mg by mouth as needed.   famotidine 20 MG tablet Commonly known as: Pepcid One after supper   furosemide 40 MG tablet Commonly known as: LASIX Take 40 mg by mouth daily.   Mitigare 0.6 MG Caps Generic drug: Colchicine Take 1 capsule by mouth 2 (two) times daily as needed.   montelukast 10 MG tablet Commonly known as: SINGULAIR Take 1 tablet (10 mg total) by mouth at bedtime.   pantoprazole 40 MG tablet Commonly known as: PROTONIX Take 40 mg by mouth daily before breakfast.   tadalafil 20 MG tablet Commonly known as: CIALIS Take 20 mg by mouth daily as needed for erectile dysfunction.   tiZANidine 4 MG tablet Commonly known as: ZANAFLEX Take 4 mg by mouth every 6 (six) hours as needed for muscle spasms.   UNABLE TO FIND Med Name: CPAP with 3lpm o2  Adapt   Vitamin D (Ergocalciferol) 1.25 MG (50000 UNIT) Caps capsule Commonly known as: DRISDOL Take 50,000 Units by mouth once a week.       Allergies  Allergen Reactions   Penicillins Itching    Has patient had a PCN reaction causing immediate rash, facial/tongue/throat swelling, SOB or lightheadedness with hypotension: No Has patient had a PCN reaction causing severe rash involving mucus membranes or skin necrosis: No Has patient had a PCN reaction that required hospitalization: No Has patient had a PCN  reaction occurring within the last 10 years: No If all of the above answers are "NO", then may  proceed with Cephalosporin use.      Procedures/Studies: DG Chest Port 1 View  Result Date: 12/26/2022 CLINICAL DATA:  [id number] Respiratory failure [facility]) [id number] EXAM: PORTABLE CHEST 1 VIEW COMPARISON:  December 23, 2022 FINDINGS: The cardiomediastinal silhouette is unchanged in contour.Persistent elevation of the RIGHT hemidiaphragm. No pleural effusion. No pneumothorax. RIGHT basilar atelectasis. No acute pleuroparenchymal abnormality. IMPRESSION: Persistent elevation of the RIGHT hemidiaphragm with RIGHT basilar atelectasis. Electronically Signed   By: Meda Klinefelter M.D.   On: 12/26/2022 10:04   VAS [place] LOWER EXTREMITY VENOUS (DVT) (ONLY [facility] & [facility])  Result Date: 12/24/2022  Lower Venous DVT Study Patient Name:  Kurt [name]  Date of Exam:   12/23/2022 Medical Rec #: [id number]      Accession #:    [id number] Date of Birth: [birth year]      Patient Gender: M Patient Age:   84 years Exam Location:  [facility] Procedure:      VAS [place] LOWER EXTREMITY VENOUS (DVT) Referring Phys: BRITNI HENDERLY --------------------------------------------------------------------------------  Indications: Pain and swelling x 1 month.  Limitations: Body habitus and poor ultrasound/tissue interface. Comparison Study: No previous exams Performing Technologist: Jody Hill RVT, RDMS  Examination Guidelines: A complete evaluation includes B-mode imaging, spectral Doppler, color Doppler, and power Doppler as needed of all accessible portions of each vessel. Bilateral testing is considered an integral part of a complete examination. Limited examinations for reoccurring indications may be performed as noted. The reflux portion of the exam is performed with the patient in reverse Trendelenburg.  +-----+---------------+---------+-----------+----------+--------------+ RIGHTCompressibilityPhasicitySpontaneityPropertiesThrombus Aging  +-----+---------------+---------+-----------+----------+--------------+ CFV  Full           Yes      Yes                                 +-----+---------------+---------+-----------+----------+--------------+   +---------+---------------+---------+-----------+----------+-------------------+ LEFT     CompressibilityPhasicitySpontaneityPropertiesThrombus Aging      +---------+---------------+---------+-----------+----------+-------------------+ CFV      Full           No       Yes        pulsatile                     +---------+---------------+---------+-----------+----------+-------------------+ SFJ      Full                                                             +---------+---------------+---------+-----------+----------+-------------------+ FV Prox  Full           Yes      Yes                                      +---------+---------------+---------+-----------+----------+-------------------+ FV Mid   Full           Yes      Yes                                      +---------+---------------+---------+-----------+----------+-------------------+ FV DistalFull           Yes      Yes  Not well visualized +---------+---------------+---------+-----------+----------+-------------------+ PFV      Full                                                             +---------+---------------+---------+-----------+----------+-------------------+ POP      Partial        Yes      Yes                  Acute               +---------+---------------+---------+-----------+----------+-------------------+ PTV      None           No       No                   Acute               +---------+---------------+---------+-----------+----------+-------------------+ PERO                                                  Not visualized      +---------+---------------+---------+-----------+----------+-------------------+ Gastroc  None           No        No                   Acute               +---------+---------------+---------+-----------+----------+-------------------+   Left Technical Findings: Not visualized segments include peroneal veins.   Summary: RIGHT: - No evidence of common femoral vein obstruction.  LEFT: - Findings consistent with acute deep vein thrombosis involving the left popliteal vein, left posterior tibial veins, and left gastrocnemius veins. - No cystic structure found in the popliteal fossa.  *See table(s) above for measurements and observations. Electronically signed by Waverly Ferrari MD on 12/24/2022 at 6:20:23 PM.    Final    ECHOCARDIOGRAM COMPLETE  Result Date: 12/24/2022    ECHOCARDIOGRAM REPORT   Patient Name:   Kurt [name] Date of Exam: 12/24/2022 Medical Rec #:  [id number]     Height:       66.5 in Accession #:    [id number]    Weight:       262.0 lb Date of Birth:  December 25, 1960     BSA:          2.256 m Patient Age:    61 years      BP:           107/71 mmHg Patient Gender: M             HR:           96 bpm. Exam Location:  Inpatient Procedure: 2D Echo, Cardiac Doppler and Color Doppler Indications:    CHF - Acute Systolic  History:        Patient has prior history of Echocardiogram examinations, most                 recent 06/21/2017. COPD, Signs/Symptoms:acute respiratory                 failure, Edema, Dyspnea, Shortness of Breath and Chest Pain;  Risk Factors:Sleep Apnea, Hypertension, Diabetes and Former                 Smoker.  Sonographer:    Wallie Char Referring Phys: [id number] ROBERT DORRELL  Sonographer Comments: Technically difficult study due to poor echo windows and patient is obese. Image acquisition challenging due to patient body habitus and Limited exam due to patient inability to tolerate probe pressure. IMPRESSIONS  1. Left ventricular ejection fraction, by estimation, is 70 to 75%. The left ventricle has hyperdynamic function. The left ventricle has no regional wall motion  abnormalities. There is mild concentric left ventricular hypertrophy. Left ventricular diastolic parameters are consistent with Grade I diastolic dysfunction (impaired relaxation). There is the interventricular septum is flattened in systole, consistent with right ventricular pressure overload.  2. Right ventricular systolic function is mildly reduced. The right ventricular size is severely enlarged. There is severely elevated pulmonary artery systolic pressure. The estimated right ventricular systolic pressure is 68.6 mmHg.  3. Left atrial size was mildly dilated.  4. Right atrial size was moderately dilated.  5. The mitral valve is normal in structure. No evidence of mitral valve regurgitation. No evidence of mitral stenosis.  6. The aortic valve is tricuspid. Aortic valve regurgitation is not visualized. No aortic stenosis is present.  7. The inferior vena cava is dilated in size with <50% respiratory variability, suggesting right atrial pressure of 15 mmHg. Comparison(s): Prior images unable to be directly viewed, comparison made by report only. No significant change from prior study. Conclusion(s)/Recommendation(s): Findings consistent with Cor Pulmonale. Some features suggest chronic cor pulmonale, but cannot exclude a component of superimposed acute cor pulmonale due to acute pulmonary embolism. FINDINGS  Left Ventricle: Left ventricular ejection fraction, by estimation, is 70 to 75%. The left ventricle has hyperdynamic function. The left ventricle has no regional wall motion abnormalities. The left ventricular internal cavity size was normal in size. There is mild concentric left ventricular hypertrophy. The interventricular septum is flattened in systole, consistent with right ventricular pressure overload. Left ventricular diastolic parameters are consistent with Grade I diastolic dysfunction (impaired relaxation). Normal left ventricular filling pressure. Right Ventricle: The right ventricular size is  severely enlarged. Right vetricular wall thickness was not well visualized. Right ventricular systolic function is mildly reduced. There is severely elevated pulmonary artery systolic pressure. The tricuspid  regurgitant velocity is 3.66 m/s, and with an assumed right atrial pressure of 15 mmHg, the estimated right ventricular systolic pressure is 68.6 mmHg. Left Atrium: Left atrial size was mildly dilated. Right Atrium: Right atrial size was moderately dilated. Pericardium: There is no evidence of pericardial effusion. Mitral Valve: The mitral valve is normal in structure. No evidence of mitral valve regurgitation. No evidence of mitral valve stenosis. MV peak gradient, 5.1 mmHg. The mean mitral valve gradient is 2.0 mmHg. Tricuspid Valve: The tricuspid valve is normal in structure. Tricuspid valve regurgitation is mild. Aortic Valve: The aortic valve is tricuspid. Aortic valve regurgitation is not visualized. No aortic stenosis is present. Aortic valve mean gradient measures 3.0 mmHg. Aortic valve peak gradient measures 5.8 mmHg. Aortic valve area, by VTI measures 2.97 cm. Pulmonic Valve: The pulmonic valve was grossly normal. Pulmonic valve regurgitation is not visualized. No evidence of pulmonic stenosis. Aorta: The aortic root and ascending aorta are structurally normal, with no evidence of dilitation. Venous: The inferior vena cava is dilated in size with less than 50% respiratory variability, suggesting right atrial pressure of 15 mmHg. IAS/Shunts: The interatrial septum was not well  visualized.  LEFT VENTRICLE PLAX 2D LVIDd:         4.80 cm     Diastology LVIDs:         2.80 cm     LV e' medial:    7.00 cm/s LV PW:         1.30 cm     LV E/e' medial:  10.8 LV IVS:        1.30 cm     LV e' lateral:   16.10 cm/s LVOT diam:     1.90 cm     LV E/e' lateral: 4.7 LV SV:         56 LV SV Index:   25 LVOT Area:     2.84 cm  LV Volumes (MOD) LV vol d, MOD A4C: 71.0 ml LV vol s, MOD A4C: 17.3 ml LV SV MOD A4C:      71.0 ml RIGHT VENTRICLE             IVC RV Basal diam:  5.60 cm     IVC diam: 2.50 cm RV S prime:     90.00 cm/s TAPSE (M-mode): 1.6 cm LEFT ATRIUM             Index        RIGHT ATRIUM           Index LA diam:        4.10 cm 1.82 cm/m   RA Area:     23.90 cm LA Vol (A2C):   60.9 ml 27.00 ml/m  RA Volume:   72.20 ml  32.01 ml/m LA Vol (A4C):   58.7 ml 26.02 ml/m LA Biplane Vol: 60.7 ml 26.91 ml/m  AORTIC VALVE AV Area (Vmax):    2.48 cm AV Area (Vmean):   2.39 cm AV Area (VTI):     2.97 cm AV Vmax:           120.00 cm/s AV Vmean:          80.300 cm/s AV VTI:            0.187 m AV Peak Grad:      5.8 mmHg AV Mean Grad:      3.0 mmHg LVOT Vmax:         105.00 cm/s LVOT Vmean:        67.700 cm/s LVOT VTI:          0.196 m LVOT/AV VTI ratio: 1.05  AORTA Ao Root diam: 3.60 cm Ao Asc diam:  3.40 cm MITRAL VALVE                TRICUSPID VALVE MV Area (PHT): 2.60 cm     TR Peak grad:   53.6 mmHg MV Area VTI:   2.98 cm     TR Vmax:        366.00 cm/s MV Peak grad:  5.1 mmHg MV Mean grad:  2.0 mmHg     SHUNTS MV Vmax:       1.13 m/s     Systemic VTI:  0.20 m MV Vmean:      66.1 cm/s    Systemic Diam: 1.90 cm MV Decel Time: 292 msec MV E velocity: 75.30 cm/s MV A velocity: 102.00 cm/s MV E/A ratio:  0.74 Mihai Croitoru MD Electronically signed by Thurmon Fair MD Signature Date/Time: 12/24/2022/10:25:20 AM    Final    CT Angio Chest PE W and/or Wo Contrast  Result Date: 12/23/2022 CLINICAL DATA:  Shortness  of breath EXAM: CT ANGIOGRAPHY CHEST WITH CONTRAST TECHNIQUE: Multidetector CT imaging of the chest was performed using the standard protocol during bolus administration of intravenous contrast. Multiplanar CT image reconstructions and MIPs were obtained to evaluate the vascular anatomy. RADIATION DOSE REDUCTION: This exam was performed according to the departmental dose-optimization program which includes automated exposure control, adjustment of the mA and/or kV according to patient size and/or use of  iterative reconstruction technique. CONTRAST:  80mL OMNIPAQUE IOHEXOL 350 MG/ML SOLN COMPARISON:  None Available. FINDINGS: Cardiovascular: Extensive pulmonary embolus involving the right pulmonary artery and distal left/interlobar pulmonary artery, and numerous bilateral proximal segmental/subsegmental branches. Overall heart size is normal. Markedly elevated RV to LV ratio of 2.3. No pericardial effusion. Normal caliber thoracic aorta with mild atherosclerotic disease. Mild coronary artery calcifications. Mediastinum/Nodes: Esophagus and thyroid are unremarkable. No pathologically enlarged lymph nodes seen in the chest. Lungs/Pleura: Central airways are patent. Marked elevation of the right hemidiaphragm with adjacent atelectasis. Mild lingular atelectasis also seen. No pleural effusion or pneumothorax. Upper Abdomen: No acute abnormality. Musculoskeletal: No chest wall abnormality. No acute or significant osseous findings. Review of the MIP images confirms the above findings. IMPRESSION: 1. Extensive pulmonary embolus involving the right pulmonary artery and distal left/interlobar pulmonary artery, and numerous bilateral proximal segmental/subsegmental branches. 2. CT evidence of right heart strain (RV to LV ratio of 2.3). 3. Elevation of the right hemidiaphragm with adjacent atelectasis. 4. Coronary artery calcifications and aortic atherosclerosis (ICD10-I70.0). Critical Value/emergent results were called by telephone at the time of interpretation on 12/23/2022 at 9:02 pm to provider [facility] , who verbally acknowledged these results. Electronically Signed   By: Allegra Lai M.D.   On: 12/23/2022 21:04   DG Chest Port 1 View  Result Date: 12/23/2022 CLINICAL DATA:  COPD. EXAM: PORTABLE CHEST 1 VIEW COMPARISON:  Chest radiograph dated 01/13/2019. FINDINGS: Shallow inspiration with bibasilar linear and platelike atelectasis or scarring. No focal consolidation, pleural effusion, or pneumothorax.  Top-normal cardiac silhouette. No acute osseous pathology. IMPRESSION: No acute cardiopulmonary process. Electronically Signed   By: Elgie Collard M.D.   On: 12/23/2022 19:39      Subjective: Patient denies complaints.  Reports that his breathing is back to baseline even on ambulation.  No dyspnea, chest pain reported.  Indicates that his left leg was swollen when he came in but that had resolved as well.  Rest of history as noted above.  Insists on being discharged home today.  Reports that he has been in the hospital for 9 days now and has stuff to take care of at home.  Discharge Exam:  Vitals:   01/01/23 1300 01/01/23 1345 01/01/23 1400 01/01/23 1500  BP:  116/89 117/61 120/69  Pulse: 99 98 94 97  Resp: (!) 22 19 (!) 23 (!) 23  Temp:      TempSrc:      SpO2: 92% (!) 87% (!) 87% (!) 87%  Weight:      Height:        General: Young male, moderately built and morbidly obese lying comfortably propped up in bed without distress. Cardiovascular: S1 & S2 heard, RRR, S1/S2 +. No murmurs, rubs, gallops or clicks. No JVD or pedal edema.  Telemetry personally reviewed: Mild sinus tachycardia <105/min. Respiratory: Clear to auscultation without wheezing, rhonchi or crackles. No increased work of breathing. Abdominal:  Non distended, non tender & soft. No organomegaly or masses appreciated. Normal bowel sounds heard. CNS: Alert and oriented. No focal deficits. Extremities: no edema,  no cyanosis.  Left leg slightly warm compared to the right but no asymmetric swelling or tenderness.  Good distal pulses felt.    The results of significant diagnostics from this hospitalization (including imaging, microbiology, ancillary and laboratory) are listed below for reference.     Microbiology: Recent Results (from the past 240 hour(s))  Blood culture (routine x 2)     Status: None   Collection Time: 12/23/22  6:45 PM   Specimen: BLOOD RIGHT FOREARM  Result Value Ref Range Status   Specimen  Description   Final    BLOOD RIGHT FOREARM Performed at [facility], [address], [place], [place] [address]    Special Requests   Final    BOTTLES DRAWN AEROBIC AND ANAEROBIC Blood Culture results may not be optimal due to an excessive volume of blood received in culture bottles Performed at [facility], [address], [place], [place] [address]    Culture   Final    NO GROWTH 5 DAYS Performed at [facility], [address], [place], [place] [address]    Report Status 12/28/2022 FINAL  Final  Blood culture (routine x 2)     Status: None   Collection Time: 12/23/22  6:58 PM   Specimen: BLOOD RIGHT HAND  Result Value Ref Range Status   Specimen Description   Final    BLOOD RIGHT HAND Performed at [facility], [address] [place], [place] [address]    Special Requests   Final    BOTTLES DRAWN AEROBIC AND ANAEROBIC Blood Culture results may not be optimal due to an excessive volume of blood received in culture bottles Performed at [facility], [address] [place], [place] [address]    Culture   Final    NO GROWTH 5 DAYS Performed at [facility], [address], [place], [place] [address]    Report Status 12/28/2022 FINAL  Final  MRSA Next Gen by PCR, Nasal     Status: None   Collection Time: 12/24/22  9:16 AM   Specimen: Nasal Mucosa; Nasal Swab  Result Value Ref Range Status   MRSA by PCR Next Gen NOT DETECTED NOT DETECTED Final    Comment: (NOTE) The GeneXpert MRSA Assay (FDA approved for NASAL specimens only), is one component of a comprehensive MRSA colonization surveillance program. It is not intended to diagnose MRSA infection nor to guide or monitor treatment for MRSA infections. Test performance is not FDA approved in patients less than 50 years old. Performed at [facility], [address], [place], [place] [address]      Labs: CBC: Recent  Labs  Lab 12/27/22 0331 12/28/22 0319 12/29/22 0310 12/30/22 0525 12/31/22 0305  WBC 11.8* 11.2* 9.0 8.9 8.5  HGB 15.0 14.4 14.7 14.2 15.1  HCT 52.1* 49.1 50.1 48.9 52.0  MCV 94.0 91.6 90.6 91.9 92.0  PLT 180 186 209 193 225    Basic Metabolic Panel: Recent Labs  Lab 12/26/22 0322 12/27/22 0331 12/28/22 0319 01/01/23 0630  NA 137 137 135 133*  K 5.4* 5.0 4.6 4.6  CL 95* 95* 92* 91*  CO2 34* 38* 37* 38*  GLUCOSE 117* 108* 94 92  BUN 12 10 13 13   CREATININE 0.94 0.90 0.79 0.82  CALCIUM 8.4* 8.2* 8.5* 8.5*  MG 2.3 2.3 2.2  --     Liver Function Tests: Recent Labs  Lab 12/26/22 0322 12/27/22 0331 12/28/22 0319  AST 27 23 25   ALT 49* 45* 40  ALKPHOS 72 72 61  BILITOT 0.7 0.3 0.6  PROT 6.8 7.2 6.6  ALBUMIN 3.0* 3.3* 2.9*      Time coordinating discharge: 45 minutes  SIGNED:  Marcellus Scott, MD,  FACP, FHM, SFHM, [facility], [facility]   [facility] Advisor [place]     To contact the attending provider between 7A-7P or the covering provider during after hours 7P-7A, please log into the web site [email or web address] and access using universal [place] password for that web site. If you do not have the password, please call the hospital operator.

## 2023-01-01 NOTE — Plan of Care (Signed)
  Problem: Education: Goal: Knowledge of General Education information will improve Description: Including pain rating scale, medication(s)/side effects and non-pharmacologic comfort measures 01/01/2023 1051 by Addison Naegeli, RN Outcome: Completed/Met 01/01/2023 1047 by Addison Naegeli, RN Outcome: Adequate for Discharge   Problem: Health Behavior/Discharge Planning: Goal: Ability to manage health-related needs will improve 01/01/2023 1051 by Addison Naegeli, RN Outcome: Completed/Met 01/01/2023 1047 by Addison Naegeli, RN Outcome: Adequate for Discharge   Problem: Clinical Measurements: Goal: Ability to maintain clinical measurements within normal limits will improve 01/01/2023 1051 by Addison Naegeli, RN Outcome: Completed/Met 01/01/2023 1047 by Addison Naegeli, RN Outcome: Adequate for Discharge Goal: Will remain free from infection 01/01/2023 1051 by Addison Naegeli, RN Outcome: Completed/Met 01/01/2023 1047 by Addison Naegeli, RN Outcome: Adequate for Discharge Goal: Diagnostic test results will improve 01/01/2023 1051 by Addison Naegeli, RN Outcome: Completed/Met 01/01/2023 1047 by Addison Naegeli, RN Outcome: Adequate for Discharge Goal: Respiratory complications will improve 01/01/2023 1051 by Addison Naegeli, RN Outcome: Completed/Met 01/01/2023 1047 by Addison Naegeli, RN Outcome: Adequate for Discharge Goal: Cardiovascular complication will be avoided 01/01/2023 1051 by Addison Naegeli, RN Outcome: Completed/Met 01/01/2023 1047 by Addison Naegeli, RN Outcome: Adequate for Discharge   Problem: Activity: Goal: Risk for activity intolerance will decrease 01/01/2023 1051 by Addison Naegeli, RN Outcome: Completed/Met 01/01/2023 1047 by Addison Naegeli, RN Outcome: Adequate for Discharge   Problem: Nutrition: Goal: Adequate nutrition will be maintained 01/01/2023 1051 by Addison Naegeli, RN Outcome: Completed/Met 01/01/2023  1047 by Addison Naegeli, RN Outcome: Adequate for Discharge   Problem: Coping: Goal: Level of anxiety will decrease 01/01/2023 1051 by Addison Naegeli, RN Outcome: Completed/Met 01/01/2023 1047 by Addison Naegeli, RN Outcome: Adequate for Discharge   Problem: Elimination: Goal: Will not experience complications related to bowel motility 01/01/2023 1051 by Addison Naegeli, RN Outcome: Completed/Met 01/01/2023 1047 by Addison Naegeli, RN Outcome: Adequate for Discharge Goal: Will not experience complications related to urinary retention 01/01/2023 1051 by Addison Naegeli, RN Outcome: Completed/Met 01/01/2023 1047 by Addison Naegeli, RN Outcome: Adequate for Discharge   Problem: Pain Managment: Goal: General experience of comfort will improve 01/01/2023 1051 by Addison Naegeli, RN Outcome: Completed/Met 01/01/2023 1047 by Addison Naegeli, RN Outcome: Adequate for Discharge   Problem: Safety: Goal: Ability to remain free from injury will improve 01/01/2023 1051 by Addison Naegeli, RN Outcome: Completed/Met 01/01/2023 1047 by Addison Naegeli, RN Outcome: Adequate for Discharge   Problem: Skin Integrity: Goal: Risk for impaired skin integrity will decrease 01/01/2023 1051 by Addison Naegeli, RN Outcome: Completed/Met 01/01/2023 1047 by Addison Naegeli, RN Outcome: Adequate for Discharge

## 2023-01-01 NOTE — Progress Notes (Signed)
SATURATION QUALIFICATIONS: (This note is used to comply with regulatory documentation for home oxygen)  Patient Saturations on Room Air at Rest = 85%  Patient Saturations on Room Air while Ambulating = 81%  Patient Saturations on 4 Liters of oxygen while Ambulating = 89%  Please briefly explain why patient needs home oxygen: Pt is already on home o2, per pt on 5L at home, that has been a recent switch from his 3L. Pt has history of o2 use, states he felt fine while walking, denies sob, is ready to go home. No weakness noted, note states pt is to be discharged if his maintains 5L or below during walk, will contact provider for update.

## 2023-01-02 ENCOUNTER — Other Ambulatory Visit: Payer: Self-pay | Admitting: Internal Medicine

## 2023-01-03 LAB — COMPLETE METABOLIC PANEL WITH GFR
AG Ratio: 1.1 (calc) (ref 1.0–2.5)
ALT: 37 U/L (ref 9–46)
AST: 20 U/L (ref 10–35)
Albumin: 3.5 g/dL — ABNORMAL LOW (ref 3.6–5.1)
Alkaline phosphatase (APISO): 67 U/L (ref 35–144)
BUN: 20 mg/dL (ref 7–25)
CO2: 34 mmol/L — ABNORMAL HIGH (ref 20–32)
Calcium: 9 mg/dL (ref 8.6–10.3)
Chloride: 92 mmol/L — ABNORMAL LOW (ref 98–110)
Creat: 0.97 mg/dL (ref 0.70–1.35)
Globulin: 3.1 g/dL (calc) (ref 1.9–3.7)
Glucose, Bld: 98 mg/dL (ref 65–99)
Potassium: 4.2 mmol/L (ref 3.5–5.3)
Sodium: 136 mmol/L (ref 135–146)
Total Bilirubin: 0.3 mg/dL (ref 0.2–1.2)
Total Protein: 6.6 g/dL (ref 6.1–8.1)
eGFR: 89 mL/min/{1.73_m2} (ref >=60)

## 2023-01-03 LAB — CBC WITH DIFFERENTIAL/PLATELET
Absolute Monocytes: 1258 cells/uL — ABNORMAL HIGH (ref 200–950)
Basophils Absolute: 79 cells/uL (ref 0–200)
Basophils Relative: 0.9 %
Eosinophils Absolute: 123 cells/uL (ref 15–500)
Eosinophils Relative: 1.4 %
HCT: 47.8 % (ref 38.5–50.0)
Hemoglobin: 14.6 g/dL (ref 13.2–17.1)
Lymphs Abs: 1901 cells/uL (ref 850–3900)
MCH: 26.3 pg — ABNORMAL LOW (ref 27.0–33.0)
MCHC: 30.5 g/dL — ABNORMAL LOW (ref 32.0–36.0)
MCV: 86 fL (ref 80.0–100.0)
MPV: 11.6 fL (ref 7.5–12.5)
Monocytes Relative: 14.3 %
Neutro Abs: 5438 cells/uL (ref 1500–7800)
Neutrophils Relative %: 61.8 %
Platelets: 236 10*3/uL (ref 140–400)
RBC: 5.56 10*6/uL (ref 4.20–5.80)
RDW: 14.4 % (ref 11.0–15.0)
Total Lymphocyte: 21.6 %
WBC: 8.8 10*3/uL (ref 3.8–10.8)

## 2023-01-17 ENCOUNTER — Encounter: Payer: Self-pay | Admitting: Internal Medicine

## 2023-01-17 ENCOUNTER — Ambulatory Visit: Payer: Medicaid Other | Admitting: Internal Medicine

## 2023-01-17 VITALS — BP 112/66 | HR 85 | Temp 98.5°F | Ht 66.5 in | Wt 286.6 lb

## 2023-01-17 DIAGNOSIS — J45991 Cough variant asthma: Secondary | ICD-10-CM

## 2023-01-17 DIAGNOSIS — J9612 Chronic respiratory failure with hypercapnia: Secondary | ICD-10-CM

## 2023-01-17 DIAGNOSIS — J9611 Chronic respiratory failure with hypoxia: Secondary | ICD-10-CM

## 2023-01-17 NOTE — Progress Notes (Unsigned)
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 Ginette Otto. Pt states he has always has had SOB and bronchitis since he was a child, but has had increased SOB for the past 3-4 yrs. He states sometimes he gets winded just walking from room to room at home. He is using proair 2 x daily on and albuterol neb 1 x daily on average.   Gradually worse breathing = lifelong and [name] on 02 / nebs but freq just sob across the room  Doe = MMRC3 = can't walk 100 yards even at a slow pace at a flat grade s stopping due to sob  /02 2lpm  But not while on cpap as the machine is old and no adaptor for it "they said they would call but never did" rec Please see patient coordinator before you leave today  to schedule CPAP titration study> bipap per Craige Cotta Stop lisinopril and start losartan 50-12.5 and  Your breathing should gradually improve Only use your albuterol (PROAIR) as a rescue med Only use Albuterol neb if you first try the proair    12/03/2017  f/u ov/Kurt [name] re: doe with pfts c/w MO only  Chief Complaint  Patient presents with   Follow-up    Breathing is overall doing well. He has occ cough in the mornings with light yellow sputum. He uses his albuterol inhaler once per wk on average and has not had to use neb.   Dyspnea:  Improved to [facility] = can't walk a nl pace on a flat grade s sob but does fine slow  and flat [name] on port 02 than off  Cough: min am mucus Sleep: fine on bipap and 02 SABA use:  As above, never noct rec Wt loss    01/13/2019 acute extended ov/Kurt [name] re: worse doe  x 2 month/ indolent onset/ gradaully progressive  Chief Complaint  Patient presents with   Acute Visit    Increased SOB over the past 2 months. He states he gets SOB walking from parking lot to building. He occ has cough with tan sputum.   no prolem sitting /sleeping on cpap and 02  Main issue is doe worse in heat but also at [employer] can't do but 2 aisle then has to stop MMRC3 = can't walk 100 yards even at a slow pace at a flat grade s stopping due to sob   More cough since doe but min prod tan mucus, daytime only  [name] p neb last used neb two days prior to OV   No more leg swelling than usual  Does fine on cpap and 02  Had "full labs" one day prior to ov at pcp office hasn't heard any results yet rec Pantoprazole (protonix) 40 mg   Take  30-60 min before first  meal of the day and Pepcid (famotidine)  20 mg one after supper    Plan A = Automatic = Dulera 100 Take 2 puffs first thing in am and then another 2 puffs about 12 hours later.  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 = Backup Only use your albuterol(proair)  inhaler as a rescue medication Plan C = Crisis - only use your albuterol nebulizer if you first try Plan B and it fails to help > ok to use the nebulizer up to every 4 hours but if start needing it regularly call for immediate appointment Please schedule a follow up office visit in 2  weeks, sooner if needed  with all medications   01/26/2019  f/u ov/Kurt [name] re: doe / able to work as Conservation officer, historic buildings protonix caused diarrhea x 2 days so stopped/ did not report as problem/ still has 15 extra doses in dulera where should be 4-5  Chief Complaint  Patient presents with   Follow-up    Breathing [name] since the last visit.   much less need for albuterol  Dyspnea:  MMRC3 = can't walk 100 yards even at a slow  pace at a flat grade s stopping due to sob/ no chest tightness  Cough: brownish mucus esp in am  Sleeping: fine on cpap and 02 3lpm  SABA use: none now  02: only at hs  rec zpak  Continue dulera 100 (symbicort 80) Take 2 puffs first thing in am and then another 2 puffs about 12 hours later.      03/09/2019  f/u ov/Kurt [name] re: sob related to asthma and obesity/ did not bring all meds   Chief Complaint  Patient presents with   Follow-up    Breathing has not improved. He is using his albuterol inhaler 4 x per day and neb about 4 x per wk. He had cough this am with blood specks in it.   Dyspnea:  Walks on weekends x 5 min flat nl pace = MMRC3 = can't walk 100 yards even at a slow pace at a flat grade s stopping due to sob   Cough: minimal in am /[name] when on  on dulera/ mostly mucoid no significant blood   Sleeping: on back /on cpap sood  SABA use: as above - much more when out of laba/ics 02: 3lpm hs and does not use during the day at work he says  due to fire hazzard  rec Plan A = Automatic = symbicort 80 Take 2 puffs first thing in am and then another 2 puffs about 12 hours later.  Work on inhaler technique:    Plan B = Backup Only use your albuterol inhaler  Plan C = Crisis - only use your albuterol nebulizer if you first try Plan B Walk up 30 min but make sure your have 02 over 90% - pace yourself    04/20/2019  f/u ov/Kurt [name] re: asthma/ obesity   Chief Complaint  Patient presents with   Follow-up    Breathing is about the same. He is using his albuterol inhaler 5 x per wk on average and he has not used his neb since the last visit.   Dyspnea:  MMRC2 = can't walk a nl pace on a flat grade s sob but does fine slow and flat  Cough: none  Sleeping: sleeps on back/ on cpap, two pillows SABA use: up to once a day, just the inhaler, not the neb 02: 3lpm hs on cpap  rec Work on  inhaler technique:   To get the most out of exercise, you need to be continuously aware that you are short  of breath, but never out of breath, for 20- 30 minutes daily.  Make sure you check your oxygen saturations at highest level of activity to be sure it stays over 90% and adjust portable 02 flow  upward to maintain this level if needed but remember to turn it back to previous settings when you stop (to conserve your supply).    07/27/2019  f/u ov/Kurt [name] re: asthma/ mo/ osa on noct cpap/02  Chief Complaint  Patient presents with   Follow-up    Had asthma attack approx 1 wk ago- woke him up and had to take off his CPAP and had to use his albuterol inhaler and neb later that day. He has noticed some wheezing recently.   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 Cough: none Sleeping: cpap, bed flat 3 pillows  SABA use: once a day when not wearing 02/ very rarely  02: 3lpm cpap prn/ uses POC 2-3 pulsed POC  With sats low 90s when working around  Rec Work on inhaler technique:  Only use your albuterol as a rescue medication Also ok to Try albuterol 15 min before an activity that you know would make you short of breath Make sure you check your oxygen saturations at highest level of activity to be sure it stays over 90%   Please schedule a follow up visit in 6  months but call sooner if needed with inhalers and your portable 02     01/17/2023  f/u ov/Kurt [name] re: asthma/mo  maint on breztri but hfa poor did not  bring  meds/ 02  Chief Complaint  Patient presents with   Follow-up    Cough with clear/yellow sputum, DOE, using O2    Dyspnea:  no longer working / 100 ft flat to mb and back s stopping  Cough: nothing unusual  Sleeping: flat bed/ cpap per sood  SABA use: rarely hfa but using neb at bedtime  02: 3lpm into cpap / titrates to > 90 degrees      No obvious day to day or daytime variability or assoc excess/ purulent sputum or mucus plugs or hemoptysis or cp or chest tightness, subjective wheeze or overt sinus or hb symptoms.   *** without nocturnal  or early am exacerbation  of respiratory  c/o's or need for noct saba. Also denies any obvious fluctuation of symptoms with weather or environmental changes or other aggravating or alleviating factors except as outlined above   No unusual exposure hx or h/o childhood pna/ asthma or knowledge of premature birth.  Current Allergies, Complete Past Medical History, Past Surgical History, Family History, and Social History were reviewed in [employer] record.  ROS  The following are not active complaints unless bolded Hoarseness, sore throat, dysphagia, dental problems, itching, sneezing,  nasal congestion or discharge of excess mucus or purulent secretions, ear ache,   fever, chills, sweats, unintended wt loss or wt gain, classically pleuritic or exertional cp,  orthopnea pnd or arm/hand swelling  or leg swelling, presyncope, palpitations, abdominal pain, anorexia, nausea, vomiting, diarrhea  or change in bowel habits or change in bladder habits, change in stools or change in urine, dysuria, hematuria,  rash, arthralgias, visual complaints, headache, numbness, weakness or ataxia or problems with walking or coordination,  change in mood or  memory.        Current Meds  Medication Sig   albuterol (PROVENTIL) (2.5 MG/3ML) 0.083% nebulizer solution Take 3 mLs (2.5 mg total) by nebulization every 6 (six) hours as needed for wheezing or  shortness of breath.   apixaban (ELIQUIS) 5 MG TABS tablet Take 2 tabs (10 mg total) twice daily through 01/02/2023 and then from 01/03/2023, start taking 1 tab (5 mg total) twice daily.   Budeson-Glycopyrrol-Formoterol (BREZTRI AEROSPHERE) 160-9-4.8 MCG/ACT AERO Inhale 2 puffs into the lungs in the morning and at bedtime.   cetirizine (ZYRTEC) 10 MG tablet Take 10 mg by mouth as needed.   furosemide (LASIX) 40 MG tablet Take 40 mg by mouth daily.   losartan (COZAAR) 50 MG tablet Take 50 mg by mouth daily.   Multiple Vitamins-Minerals (MULTIVITAMIN GUMMIES ADULTS PO) Take by mouth.   potassium chloride (KLOR-CON) 10 MEQ tablet Take 10 mEq by mouth daily.   tadalafil (CIALIS) 20 MG tablet Take 20 mg by mouth daily as needed for erectile dysfunction.   tiZANidine (ZANAFLEX) 4 MG tablet Take 4 mg by mouth every 6 (six) hours as needed for muscle spasms.   UNABLE TO FIND Med Name: CPAP with 3lpm o2  Adapt   Vitamin D, Ergocalciferol, (DRISDOL) 50000 units CAPS capsule Take 50,000 Units by mouth once a week.                      Objective:   Physical Exam  Wts  01/17/2023          ***  05/01/2020        274 07/27/2019         316  04/20/2019         314  03/09/2019             312  01/26/2019         309  01/13/2019           307   12/03/2017        398   09/03/17 (!) 306 lb (138.8 kg)  08/03/17 (!) 304 lb (137.9 kg)  06/22/17 (!) 305 lb 11.2 oz (138.7 kg)       Vital signs reviewed  01/17/2023  - Note at rest 02 sats  ***% on ***   General appearance:    ***    Trace sym pitting both LE's ***        Assessment:

## 2023-01-17 NOTE — Patient Instructions (Addendum)
Plan A = Automatic = Always=    Breztri Take 2 puffs first thing in am and then another 2 puffs about 12 hours later.    Work on inhaler technique:  relax and gently blow all the way out then take a nice smooth full deep breath back in, triggering the inhaler at same time you start breathing in.  Hold breath in for at least  5 seconds if you can. Blow out breztri  thru nose. Rinse and gargle with water when done.  If mouth or throat bother you at all,  try brushing teeth/gums/tongue with arm and hammer toothpaste/ make a slurry and gargle and spit out.      Plan B = Backup (to supplement plan A, not to replace it) Only use your albuterol inhaler as a rescue medication to be used if you can't catch your breath by resting or doing a relaxed purse lip breathing pattern.  - The less you use it, the better it will work when you need it. - Ok to use the inhaler up to 2 puffs  every 4 hours if you must but call for appointment if use goes up over your usual need - Don't leave home without it !!  (think of it like the spare tire for your car)   Plan C = Crisis (instead of Plan B but only if Plan B stops working) - only use your albuterol nebulizer if you first try Plan B and it fails to help > ok to use the nebulizer up to every 4 hours but if start needing it regularly call for immediate appointment   Make sure you check your oxygen saturation  AT  your highest level of activity (not after you stop)   to be sure it stays over 90% and adjust  02 flow upward to maintain this level if needed but remember to turn it back to previous settings when you stop (to conserve your supply).    Please schedule a follow up visit in 3 months but call sooner if needed - bring your inhalers

## 2023-01-18 NOTE — Assessment & Plan Note (Signed)
Onset ? 11/2018 with no airflow obst on pfts 11/19/17  - 01/13/2019   > try dulera 100 2bid  - 01/26/2019     continue dulera 100 or symb 80 2bid  - 01/17/2023  After extensive coaching inhaler device,  effectiveness =    75% from baseline < 50% so continue breztri 2 bid for now   I really don't think there is much asthma here and certainly he does not have copd based on prior pfts but for now will remain on breztri with other option being symbicort/dulera.

## 2023-01-18 NOTE — Assessment & Plan Note (Signed)
HCO3  06/20/17  = 36  HCO3  01/12/2019  = 26  - 03/09/2019   Walked RA  2 laps @  approx 283ft each @ moderate pace  stopped due to  End of study, mild sob with sats still  90%   - See admit 12/23/22 with PE > bipap dep/ 3lpm 24/7 - HC03  01/01/23  = 38   Rec lifelong DOAC based on MO  Bipap per DR Craige Cotta with 3lpm hs and daytime titrate to sats > 90%

## 2023-01-18 NOTE — Assessment & Plan Note (Signed)
Body mass index is 45.57 kg/m.  -  trending up again  Lab Results  Component Value Date   TSH 1.29 10/01/2022      Contributing to doe/dvt/PE  and risk of GERD >>>   reviewed the need and the process to achieve and maintain neg calorie balance > defer f/u primary care including intermittently monitoring thyroid status            Each maintenance medication was reviewed in detail including emphasizing most importantly the difference between maintenance and prns and under what circumstances the prns are to be triggered using an action plan format where appropriate.  Total time for H and P, chart review, counseling, reviewing 02/hfa/neb  device(s) and generating customized AVS unique to this office visit / same day charting = 30 min post hosp f/u with pt not seen in 3 y

## 2023-04-21 ENCOUNTER — Ambulatory Visit: Payer: Medicaid Other | Admitting: Internal Medicine

## 2023-05-06 ENCOUNTER — Encounter: Payer: Self-pay | Admitting: Internal Medicine

## 2023-05-06 ENCOUNTER — Ambulatory Visit (INDEPENDENT_AMBULATORY_CARE_PROVIDER_SITE_OTHER): Payer: Medicaid Other | Admitting: Internal Medicine

## 2023-05-06 VITALS — BP 115/74 | HR 77 | Temp 98.5°F | Ht 66.0 in | Wt 306.0 lb

## 2023-05-06 DIAGNOSIS — J9611 Chronic respiratory failure with hypoxia: Secondary | ICD-10-CM

## 2023-05-06 DIAGNOSIS — J9612 Chronic respiratory failure with hypercapnia: Secondary | ICD-10-CM | POA: Diagnosis not present

## 2023-05-06 DIAGNOSIS — J45991 Cough variant asthma: Secondary | ICD-10-CM | POA: Diagnosis not present

## 2023-05-06 NOTE — Assessment & Plan Note (Addendum)
HCO3  06/20/17  = 36  HCO3  01/12/2019  = 26  - 03/09/2019   Walked RA  2 laps @  approx 256ft each @ moderate pace  stopped due to  End of study, mild sob with sats still  90%   - See admit 12/23/22 with PE > bipap dep/ 3lpm 24/7  - HC03  01/02/23    = 34   Well compensated an no need for 02 at rest so no changes needed  Reminded: Make sure you check your oxygen saturation  AT  your highest level of activity (not after you stop)   to be sure it stays over 90% and adjust  02 flow upward to maintain this level if needed but remember to turn it back to previous settings when you stop (to conserve your supply).

## 2023-05-06 NOTE — Assessment & Plan Note (Signed)
Onset ? 11/2018 with no airflow obst on pfts 11/19/17  - 01/13/2019   > try dulera 100 2bid  - 01/26/2019     continue dulera 100 or symb 80 2bid  - 01/17/2023  After extensive coaching inhaler device,  effectiveness =    75% from baseline < 50% so continue breztri 2 bid for now  - 05/06/2023  After extensive coaching inhaler device,  effectiveness =    80%    All goals of chronic asthma control met including optimal function and elimination of symptoms with minimal need for rescue therapy.  Contingencies discussed in full including contacting this office immediately if not controlling the symptoms using the rule of two's.     F/u can be q 6 m

## 2023-05-06 NOTE — Assessment & Plan Note (Signed)
Body mass index is 49.39 kg/m.  -  trending up again  Lab Results  Component Value Date   TSH 1.29 10/01/2022      Contributing to doe/ osa/PE  and risk of GERD >>>   reviewed the need and the process to achieve and maintain neg calorie balance > defer f/u primary care including intermittently monitoring thyroid status     Each maintenance medication was reviewed in detail including emphasizing most importantly the difference between maintenance and prns and under what circumstances the prns are to be triggered using an action plan format where appropriate.  Total time for H and P, chart review, counseling, reviewing hfa/neb/02/ pulse ox/ bipap  device(s) , directly observing portions of ambulatory 02 saturation study/ and generating customized AVS unique to this office visit / same day charting = 

## 2023-05-06 NOTE — Progress Notes (Signed)
Subjective:    Patient ID: Kurt [name], male   DOB: [birth year],    MRN: [id number]    Brief patient profile:  61 yobm quit smoking 2008  with breathing problems dating back to childhood assoc with MO never treated with inhalers then  worse with smoking but even after quit in 2008  At wt  350 breathing went "from bad to worse", variably using either hs 02 or  On cpap and referred to pulmonary clinic 09/03/2017 by Dr   Tyson Dense for sob with restrictive pfts presumed due to body habitus     History of Present Illness  09/03/2017 1st [facility] office visit/ Kurt [name]   Chief Complaint  Patient presents with   Pulmonary Consult    Referred by Dr. Ginette Otto. Pt states he has always has had SOB and bronchitis since he was a child, but has had increased SOB for the past 3-4 yrs. He states sometimes he gets winded just walking from room to room at home. He is using proair 2 x daily on and albuterol neb 1 x daily on average.   Gradually worse breathing = lifelong and better on 02 / nebs but freq just sob across the room  Doe = MMRC3 = can't walk 100 yards even at a slow pace at a flat grade s stopping due to sob  /02 2lpm  But not while on cpap as the machine is old and no adaptor for it "they said they would call but never did" rec Please see patient coordinator before you leave today  to schedule CPAP titration study> bipap per Craige Cotta Stop lisinopril and start losartan 50-12.5 and  Your breathing should gradually improve Only use your albuterol (PROAIR) as a rescue med Only use Albuterol neb if you first try the proair   See admit 12/23/22 with PE > bipap dep/ 3lpm 24/7   01/17/2023  post hosp f/u ov/Kurt [name] re: asthma/mo  maint on breztri but hfa poor did not  bring meds/ 02  Chief Complaint  Patient presents with   Follow-up    Cough with clear/yellow sputum, DOE, using O2    Dyspnea:  no longer working /baseline =  100 ft flat to mb and back s stopping  Cough: nothing unusual  Sleeping:  flat bed/ bipap per Sood  SABA use: rarely hfa but using neb at bedtime "as a preventive:" 02: 3lpm into cpap / titrates to > 90 degrees  Rec Plan A = Automatic = Always=    Breztri Take 2 puffs first thing in am and then another 2 puffs about 12 hours later.   Work on inhaler technique:  Plan B = Backup (to supplement plan A, not to replace it) Only use your albuterol inhaler as a rescue medication Plan C = Crisis (instead of Plan B but only if Plan B stops working) - only use your albuterol nebulizer if you first try Plan B Make sure you check your oxygen saturation  AT  your highest level of activity (not after you stop)   to be sure it stays over 90%   Please schedule a follow up visit in 3 months but call sooner if needed - bring your inhalers      05/06/2023  f/u ov/Kurt [name] re: asthma/MO   maint on breztri    Chief Complaint  Patient presents with   Follow-up  Dyspnea:  30 min walking most days  on 4lpm with sats mid 90s Cough: none Sleeping: flat bed with bipap per  Sood  SABA use: rarely hfa/ never neb  02: 4lpm  24/7    No obvious day to day or daytime variability or assoc excess/ purulent sputum or mucus plugs or hemoptysis or cp or chest tightness, subjective wheeze or overt sinus or hb symptoms.    Also denies any obvious fluctuation of symptoms with weather or environmental changes or other aggravating or alleviating factors except as outlined above   No unusual exposure hx or h/o childhood pna/ asthma or knowledge of premature birth.  Current Allergies, Complete Past Medical History, Past Surgical History, Family History, and Social History were reviewed in [employer] record.  ROS  The following are not active complaints unless bolded Hoarseness, sore throat, dysphagia, dental problems, itching, sneezing,  nasal congestion or discharge of excess mucus or purulent secretions, ear ache,   fever, chills, sweats, unintended wt loss or wt gain,  classically pleuritic or exertional cp,  orthopnea pnd or arm/hand swelling  or leg swelling, presyncope, palpitations, abdominal pain, anorexia, nausea, vomiting, diarrhea  or change in bowel habits or change in bladder habits, change in stools or change in urine, dysuria, hematuria,  rash, arthralgias, visual complaints, headache, numbness, weakness or ataxia or problems with walking or coordination,  change in mood or  memory.        Not able to do full med reconciliation     Objective:   Physical Exam  Wts  05/06/2023       306   01/17/2023         286  05/01/2020       274 07/27/2019         316  04/20/2019         314  03/09/2019             312  01/26/2019         309  01/13/2019           307   12/03/2017        398   09/03/17 (!) 306 lb (138.8 kg)  08/03/17 (!) 304 lb (137.9 kg)  06/22/17 (!) 305 lb 11.2 oz (138.7 kg)    Vital signs reviewed  05/06/2023  - Note at rest 02 sats  94% on 4lpm    General appearance:    MO (by bmi) amb bf nad    HEENT : Oropharynx  clear       NECK :  without  apparent JVD/ palpable Nodes/TM    LUNGS: no acc muscle use,  Nl contour chest which is clear to A and P bilaterally without cough on insp or exp maneuvers   CV:  RRR  no s3 or murmur or increase in P2, and trace pitting  both LEs  ABD:  massively obese but soft and nontender    MS:  Nl gait/ ext warm without deformities Or obvious joint restrictions  calf tenderness, cyanosis or clubbing    SKIN: warm and dry without lesions    NEURO:  alert, approp, nl sensorium with  no motor or cerebellar deficits apparent.       Assessment:

## 2023-05-06 NOTE — Patient Instructions (Addendum)
No change in medications    Please schedule a follow up visit in 6  months but call sooner if needed  

## 2023-07-20 NOTE — Progress Notes (Deleted)
 Cardiology Office Note:    Date:  07/20/2023   ID:  Kurt [name], DOB [birth year], MRN [id number]  PCP:  Shelda Atlas, MD  Cardiologist:  None  Electrophysiologist:  None   Referring MD: Shelda Atlas, MD   No chief complaint on file. ***  History of Present Illness:    Kurt [name] is a 63 y.o. male with a hx of pulmonary embolism, cor pulmonale, morbid obesity, chronic hypoxic respiratory failure, OSA who is referred by Dr. Shelda for evaluation of cor pulmonale.  He was admitted with submassive PE on 12/2022.  Echocardiogram 12/24/2022 showed EF 70 to 75%, grade 1 diastolic dysfunction, septal flattening in systole consistent with RV pressure overload, mild RV dysfunction, severe RV enlargement, severely elevated pulmonary pressures (RVSP 69 mmHg), moderate right atrial enlargement, mild left atrial enlargement, RAP 15.  Past Medical History:  Diagnosis Date   Anxiety    Arthritis    hands, knees, joints, lower back (06/20/2017)   Borderline diabetes    Chronic bronchitis (HCC)    COPD (chronic obstructive pulmonary disease) (HCC)    dx'd 06/20/2017   Depression    GERD (gastroesophageal reflux disease)    Gout    Hypertension    OSA on CPAP     Past Surgical History:  Procedure Laterality Date   NO PAST SURGERIES      Current Medications: No outpatient medications have been marked as taking for the 07/22/23 encounter (Appointment) with Kate Lonni CROME, MD.     Allergies:   Penicillins   Social History   Socioeconomic History   Marital status: Single    Spouse name: Not on file   Number of children: Not on file   Years of education: Not on file   Highest education level: Not on file  Occupational History   Not on file  Tobacco Use   Smoking status: Former    Current packs/day: 0.00    Average packs/day: 1 pack/day for 30.0 years (30.0 ttl pk-yrs)    Types: Cigarettes    Start date: 77    Quit date: 2008    Years since quitting: 17.0    Smokeless tobacco: Never  Vaping Use   Vaping status: Never Used  Substance and Sexual Activity   Alcohol  use: No    Comment: 06/20/2017 nothing since mid 2016   Drug use: No    Comment: 06/20/2017 nothing since mid 2000s   Sexual activity: Not Currently  Other Topics Concern   Not on file  Social History Narrative   Not on file   Social Drivers of Health   Financial Resource Strain: Not on file  Food Insecurity: No Food Insecurity (12/24/2022)   Hunger Vital Sign    Worried About Running Out of Food in the Last Year: Never true    Ran Out of Food in the Last Year: Never true  Transportation Needs: Unmet Transportation Needs (12/24/2022)   PRAPARE - Administrator, Civil Service (Medical): Yes    Lack of Transportation (Non-Medical): No  Physical Activity: Not on file  Stress: Not on file  Social Connections: Not on file     Family History: The patient's ***family history includes Hypertension in his mother.  ROS:   Please see the history of present illness.    *** All other systems reviewed and are negative.  EKGs/Labs/Other Studies Reviewed:    The following studies were reviewed today: ***  EKG:  EKG is *** ordered today.  The ekg ordered  today demonstrates ***  Recent Labs: 10/01/2022: TSH 1.29 12/23/2022: B Natriuretic Peptide 417.8 12/28/2022: Magnesium 2.2 01/02/2023: ALT 37; BUN 20; Creat 0.97; Hemoglobin 14.6; Platelets 236; Potassium 4.2; Sodium 136  Recent Lipid Panel    Component Value Date/Time   CHOL 134 10/01/2022 1600   TRIG 95 10/01/2022 1600   HDL 49 10/01/2022 1600   CHOLHDL 2.7 10/01/2022 1600   VLDL 17 08/09/2009 2009   LDLCALC 67 10/01/2022 1600    Physical Exam:    VS:  There were no vitals taken for this visit.    Wt Readings from Last 3 Encounters:  05/06/23 (!) 306 lb (138.8 kg)  01/17/23 286 lb 9.6 oz (130 kg)  12/23/22 262 lb (118.8 kg)     GEN: *** Well nourished, well developed in no acute distress HEENT:  Normal NECK: No JVD; No carotid bruits LYMPHATICS: No lymphadenopathy CARDIAC: ***RRR, no murmurs, rubs, gallops RESPIRATORY:  Clear to auscultation without rales, wheezing or rhonchi  ABDOMEN: Soft, non-tender, non-distended MUSCULOSKELETAL:  No edema; No deformity  SKIN: Warm and dry NEUROLOGIC:  Alert and oriented x 3 PSYCHIATRIC:  Normal affect   ASSESSMENT:    No diagnosis found. PLAN:    Cor pulmonale: In setting of submassive PE 12/2022.  Echocardiogram 12/24/2022 showed EF 70 to 75%, grade 1 diastolic dysfunction, septal flattening in systole consistent with RV pressure overload, mild RV dysfunction, severe RV enlargement, severely elevated pulmonary pressures (RVSP 69 mmHg), moderate right atrial enlargement, mild left atrial enlargement, RAP 15. -Update echocardiogram to monitor for improvement in RV function -Continue Lasix  40 mg daily.  Check BMET, magnesium***  PE: Admitted with submassive PE 12/2022.  Continue Eliquis   Chronic hypoxic respiratory failure: Follows with pulmonology.  On home oxygen  Hypertension: On losartan  50 mg daily  Morbid obesity: There is no height or weight on file to calculate BMI.  RTC in ***   Medication Adjustments/Labs and Tests Ordered: Current medicines are reviewed at length with the patient today.  Concerns regarding medicines are outlined above.  No orders of the defined types were placed in this encounter.  No orders of the defined types were placed in this encounter.   There are no Patient Instructions on file for this visit.   Signed, Lonni LITTIE Nanas, MD  07/20/2023 2:58 PM    [facility]

## 2023-07-22 ENCOUNTER — Ambulatory Visit: Payer: Medicaid Other | Admitting: Cardiology

## 2023-09-21 NOTE — Progress Notes (Deleted)
 Cardiology Office Note:    Date:  09/21/2023   ID:  Orlena Sheldon, DOB 1961-03-29, MRN [id number]  PCP:  Fleet Contras, MD  Cardiologist:  None  Electrophysiologist:  None   Referring MD: Fleet Contras, MD   No chief complaint on file. ***  History of Present Illness:    Gil Ingwersen is a 63 y.o. male with a hx of with a hx of pulmonary embolism, cor pulmonale, morbid obesity, chronic hypoxic respiratory failure, OSA who is referred by Dr. Concepcion Elk for evaluation of cor pulmonale.  He was admitted with submassive PE on 12/2022.  Echocardiogram 12/24/2022 showed EF 70 to 75%, grade 1 diastolic dysfunction, septal flattening in systole consistent with RV pressure overload, mild RV dysfunction, severe RV enlargement, severely elevated pulmonary pressures (RVSP 69 mmHg), moderate right atrial enlargement, mild left atrial enlargement, RAP 15.   Past Medical History:  Diagnosis Date   Anxiety    Arthritis    "hands, knees, joints, lower back" (06/20/2017)   Borderline diabetes    Chronic bronchitis (HCC)    COPD (chronic obstructive pulmonary disease) (HCC)    dx'd 06/20/2017   Depression    GERD (gastroesophageal reflux disease)    Gout    Hypertension    OSA on CPAP     Past Surgical History:  Procedure Laterality Date   NO PAST SURGERIES      Current Medications: No outpatient medications have been marked as taking for the 09/22/23 encounter (Appointment) with Little Ishikawa, MD.     Allergies:   Penicillins   Social History   Socioeconomic History   Marital status: Single    Spouse name: Not on file   Number of children: Not on file   Years of education: Not on file   Highest education level: Not on file  Occupational History   Not on file  Tobacco Use   Smoking status: Former    Current packs/day: 0.00    Average packs/day: 1 pack/day for 30.0 years (30.0 ttl pk-yrs)    Types: Cigarettes    Start date: 38    Quit date: 2008    Years since  quitting: 17.2   Smokeless tobacco: Never  Vaping Use   Vaping status: Never Used  Substance and Sexual Activity   Alcohol use: No    Comment: 06/20/2017 "nothing since mid 2016"   Drug use: No    Comment: 06/20/2017 "nothing since mid 2000s"   Sexual activity: Not Currently  Other Topics Concern   Not on file  Social History Narrative   Not on file   Social Drivers of Health   Financial Resource Strain: Not on file  Food Insecurity: No Food Insecurity (12/24/2022)   Hunger Vital Sign    Worried About Running Out of Food in the Last Year: Never true    Ran Out of Food in the Last Year: Never true  Transportation Needs: Unmet Transportation Needs (12/24/2022)   PRAPARE - Administrator, Civil Service (Medical): Yes    Lack of Transportation (Non-Medical): No  Physical Activity: Not on file  Stress: Not on file  Social Connections: Not on file     Family History: The patient's ***family history includes Hypertension in his mother.  ROS:   Please see the history of present illness.    *** All other systems reviewed and are negative.  EKGs/Labs/Other Studies Reviewed:    The following studies were reviewed today: ***  EKG:  EKG is *** ordered  today.  The ekg ordered today demonstrates ***  Recent Labs: 10/01/2022: TSH 1.29 12/23/2022: B Natriuretic Peptide 417.8 12/28/2022: Magnesium 2.2 01/02/2023: ALT 37; BUN 20; Creat 0.97; Hemoglobin 14.6; Platelets 236; Potassium 4.2; Sodium 136  Recent Lipid Panel    Component Value Date/Time   CHOL 134 10/01/2022 1600   TRIG 95 10/01/2022 1600   HDL 49 10/01/2022 1600   CHOLHDL 2.7 10/01/2022 1600   VLDL 17 08/09/2009 2009   LDLCALC 67 10/01/2022 1600    Physical Exam:    VS:  There were no vitals taken for this visit.    Wt Readings from Last 3 Encounters:  05/06/23 (!) 306 lb (138.8 kg)  01/17/23 286 lb 9.6 oz (130 kg)  12/23/22 262 lb (118.8 kg)     GEN: *** Well nourished, well developed in no acute  distress HEENT: Normal NECK: No JVD; No carotid bruits LYMPHATICS: No lymphadenopathy CARDIAC: ***RRR, no murmurs, rubs, gallops RESPIRATORY:  Clear to auscultation without rales, wheezing or rhonchi  ABDOMEN: Soft, non-tender, non-distended MUSCULOSKELETAL:  No edema; No deformity  SKIN: Warm and dry NEUROLOGIC:  Alert and oriented x 3 PSYCHIATRIC:  Normal affect   ASSESSMENT:    No diagnosis found. PLAN:    Cor pulmonale: In setting of submassive PE 12/2022.  Echocardiogram 12/24/2022 showed EF 70 to 75%, grade 1 diastolic dysfunction, septal flattening in systole consistent with RV pressure overload, mild RV dysfunction, severe RV enlargement, severely elevated pulmonary pressures (RVSP 69 mmHg), moderate right atrial enlargement, mild left atrial enlargement, RAP 15. -Update echocardiogram to monitor for improvement in RV function -Continue Lasix 40 mg daily.  Check BMET, magnesium***   PE: Admitted with submassive PE 12/2022.  Continue Eliquis   Chronic hypoxic respiratory failure: Follows with pulmonology.  On home oxygen   Hypertension: On losartan 50 mg daily   Morbid obesity: There is no height or weight on file to calculate BMI.   RTC in ***   Medication Adjustments/Labs and Tests Ordered: Current medicines are reviewed at length with the patient today.  Concerns regarding medicines are outlined above.  No orders of the defined types were placed in this encounter.  No orders of the defined types were placed in this encounter.   There are no Patient Instructions on file for this visit.   Signed, Little Ishikawa, MD  09/21/2023 9:45 PM    [facility]

## 2023-09-22 ENCOUNTER — Ambulatory Visit: Payer: Medicaid Other | Admitting: Cardiology

## 2023-11-02 ENCOUNTER — Ambulatory Visit: Admission: EM | Admit: 2023-11-02 | Discharge: 2023-11-02 | Disposition: A | Discharge status: Home / Self Care

## 2023-11-02 VITALS — BP 135/85 | HR 92 | Temp 97.8°F | Resp 24 | Ht 66.0 in | Wt 306.0 lb

## 2023-11-02 DIAGNOSIS — J441 Chronic obstructive pulmonary disease with (acute) exacerbation: Secondary | ICD-10-CM | POA: Diagnosis not present

## 2023-11-02 MED ORDER — IPRATROPIUM-ALBUTEROL 0.5-2.5 (3) MG/3ML IN SOLN
3.0000 mL | Freq: Once | RESPIRATORY_TRACT | Status: AC
  Administered 2023-11-02: 3 mL via RESPIRATORY_TRACT

## 2023-11-02 MED ORDER — PREDNISONE 20 MG PO TABS: 40.0000 mg | ORAL_TABLET | Freq: Every day | ORAL | 0 refills | Status: AC

## 2023-11-02 MED ORDER — AZITHROMYCIN 250 MG PO TABS: 250.0000 mg | ORAL_TABLET | Freq: Every day | ORAL | 0 refills | Status: AC

## 2023-11-02 NOTE — ED Triage Notes (Signed)
"  For about 2 wks now I have been sick, started out with a cold/congestion, cough and this hs progressed to a hard time breathing with the cough due to my COPD (with current oxygen use)". No fever known.

## 2023-11-02 NOTE — Discharge Instructions (Signed)
  Do not take colchicine while taking azithromycin .

## 2023-11-02 NOTE — ED Provider Notes (Signed)
 [facility]    CSN: [id number] Arrival date & time: 11/02/23  1150      History   Chief Complaint Chief Complaint  Patient presents with   Nasal Congestion   COPD Exacerbation    HPI Kurt [name] is a 63 y.o. male.   Patient here today for evaluation of congestion, worsening cough for the last 2 weeks.  He reports overall he feels well and has not had fever but states he has slowly progressed to having some shortness of breath.  He does have COPD at baseline and notes that he does use 4 L of oxygen continuously.  The history is provided by the patient.    Past Medical History:  Diagnosis Date   Anxiety    Arthritis    "hands, knees, joints, lower back" (06/20/2017)   Borderline diabetes    Chronic bronchitis (HCC)    COPD (chronic obstructive pulmonary disease) (HCC)    dx'd 06/20/2017   Depression    GERD (gastroesophageal reflux disease)    Gout    Hypertension    OSA on CPAP     Patient Active Problem List   Diagnosis Date Noted   Acute pulmonary embolism (HCC) 12/24/2022   Cor pulmonale (HCC) 12/24/2022   DVT (deep venous thrombosis) (HCC) 12/24/2022   Demand ischemia (HCC) 12/24/2022   Acute hypoxic respiratory failure (HCC) 12/23/2022   GERD without esophagitis 07/27/2019   Cough variant asthma 01/14/2019   Chronic respiratory failure with hypoxia and hypercapnia (HCC) 09/04/2017   Morbid obesity due to excess calories (HCC) 09/04/2017   Dyspnea on exertion 09/03/2017   Conjunctivitis 06/20/2017   Acute on chronic respiratory failure with hypoxia and hypercapnia (HCC) 06/20/2017   COPD with acute exacerbation (HCC) 06/20/2017   Bilateral leg edema 06/20/2017   Acute respiratory failure with hypoxia (HCC) 06/20/2017   OSA on CPAP    Essential hypertension    Diabetes mellitus without complication (HCC)     Past Surgical History:  Procedure Laterality Date   NO PAST SURGERIES         Home Medications    Prior to Admission  medications   Medication Sig Start Date End Date Taking? Authorizing Provider  apixaban  (ELIQUIS ) 5 MG TABS tablet Take 2 tabs (10 mg total) twice daily through 01/02/2023 and then from 01/03/2023, start taking 1 tab (5 mg total) twice daily. 01/01/23  Yes Hongalgi, Anand D, MD  azithromycin  (ZITHROMAX ) 250 MG tablet Take 1 tablet (250 mg total) by mouth daily. Take first 2 tablets together, then 1 every day until finished. 11/02/23  Yes Vernestine Gondola, PA-C  Budeson-Glycopyrrol-Formoterol  (BREZTRI  AEROSPHERE) 160-9-4.8 MCG/ACT AERO Inhale 2 puffs into the lungs in the morning and at bedtime.   Yes [provider]  cetirizine  (ZYRTEC ) 10 MG tablet Take 10 mg by mouth as needed. 12/30/17  Yes [provider]  furosemide  (LASIX ) 40 MG tablet Take 40 mg by mouth daily.   Yes [provider]  losartan  (COZAAR ) 50 MG tablet Take 50 mg by mouth daily.   Yes [provider]  Multiple Vitamins-Minerals (MULTIVITAMIN GUMMIES ADULTS PO) Take by mouth.   Yes [provider]  OXYGEN Inhale 4 L into the lungs daily. Anytime use.   Yes [provider]  potassium chloride (KLOR-CON) 10 MEQ tablet Take 10 mEq by mouth daily.   Yes [provider]  predniSONE  (DELTASONE ) 20 MG tablet Take 2 tablets (40 mg total) by mouth daily with breakfast for 5  days. 11/02/23 11/07/23 Yes Vernestine Gondola, PA-C  tadalafil, PAH, (ADCIRCA) 20 MG tablet Take 20 mg by mouth daily as needed. 06/13/23  Yes [provider]  UNABLE TO FIND Med Name: CPAP with 3lpm o2  Adapt   Yes [provider]  Vitamin D , Ergocalciferol , (DRISDOL) 50000 units CAPS capsule Take 50,000 Units by mouth once a week. 06/18/17  Yes [provider]  albuterol  (PROVENTIL ) (2.5 MG/3ML) 0.083% nebulizer solution Take 3 mLs (2.5 mg total) by nebulization every 6 (six) hours as needed for wheezing or shortness of breath. 03/30/21   Sood, Vineet, MD  pantoprazole  (PROTONIX ) 40 MG  tablet Take 40 mg by mouth daily before breakfast. Patient not taking: Reported on 01/17/2023    [provider]  tadalafil (CIALIS) 20 MG tablet Take 20 mg by mouth daily as needed for erectile dysfunction. 12/09/22   [provider]  tiZANidine (ZANAFLEX) 4 MG tablet Take 4 mg by mouth every 6 (six) hours as needed for muscle spasms.    [provider]    Family History Family History  Problem Relation Age of Onset   Hypertension Mother     Social History Social History   Tobacco Use   Smoking status: Former    Current packs/day: 0.00    Average packs/day: 1 pack/day for 30.0 years (30.0 ttl pk-yrs)    Types: Cigarettes    Start date: 53    Quit date: 2008    Years since quitting: 17.3   Smokeless tobacco: Never  Vaping Use   Vaping status: Never Used  Substance Use Topics   Alcohol  use: No    Comment: 06/20/2017 "nothing since mid 2016"   Drug use: No    Comment: 06/20/2017 "nothing since mid 2000s"     Allergies   Penicillins   Review of Systems Review of Systems  Constitutional:  Negative for chills and fever.  HENT:  Positive for congestion. Negative for ear pain and sore throat.   Eyes:  Negative for discharge and redness.  Respiratory:  Positive for cough, shortness of breath and wheezing.   Gastrointestinal:  Negative for abdominal pain, diarrhea, nausea and vomiting.     Physical Exam Triage Vital Signs ED Triage Vitals  Encounter Vitals Group     BP 11/02/23 1159 135/85     Systolic BP Percentile --      Diastolic BP Percentile --      Pulse Rate 11/02/23 1159 92     Resp 11/02/23 1159 (!) 24     Temp 11/02/23 1159 97.8 F (36.6 C)     Temp Source 11/02/23 1159 Oral     SpO2 11/02/23 1159 (!) 89 %     Weight 11/02/23 1155 (!) 306 lb (138.8 kg)     Height 11/02/23 1155 5\' 6"  (1.676 m)     Head Circumference --      Peak Flow --      Pain Score 11/02/23 1153 0     Pain Loc --      Pain Education --      Exclude  from Growth Chart --    No data found.  Updated Vital Signs BP 135/85 (BP Location: Right Arm)   Pulse 92   Temp 97.8 F (36.6 C) (Oral)   Resp (!) 24   Ht 5\' 6"  (1.676 m)   Wt (!) 306 lb (138.8 kg)   SpO2 90%   BMI 49.39 kg/m   Visual Acuity Right Eye Distance:  Left Eye Distance:   Bilateral Distance:    Right Eye Near:   Left Eye Near:    Bilateral Near:     Physical Exam Vitals and nursing note reviewed.  Constitutional:      General: He is not in acute distress.    Appearance: He is well-developed. He is not ill-appearing.  HENT:     Head: Normocephalic and atraumatic.     Nose: No congestion or rhinorrhea.     Mouth/Throat:     Tonsils: 0 on the right. 0 on the left.  Eyes:     Conjunctiva/sclera: Conjunctivae normal.  Cardiovascular:     Rate and Rhythm: Normal rate and regular rhythm.  Pulmonary:     Effort: Pulmonary effort is normal. No respiratory distress.     Breath sounds: Wheezing and rhonchi present. No rales.     Comments: Scattered wheezes and rhonchi Skin:    General: Skin is warm and dry.  Neurological:     Mental Status: He is alert.  Psychiatric:        Mood and Affect: Mood normal.        Behavior: Behavior normal.      UC Treatments / Results  Labs (all labs ordered are listed, but only abnormal results are displayed) Labs Reviewed - No data to display  EKG   Radiology No results found.  Procedures Procedures (including critical care time)  Medications Ordered in UC Medications  ipratropium-albuterol  (DUONEB) 0.5-2.5 (3) MG/3ML nebulizer solution 3 mL (3 mLs Nebulization Given 11/02/23 1207)    Initial Impression / Assessment and Plan / UC Course  I have reviewed the triage vital signs and the nursing notes.  Pertinent labs & imaging results that were available during my care of the patient were reviewed by me and considered in my medical decision making (see chart for details).    Patient reports feeling improved  with DuoNeb in office.  He does have nebulizer and inhaler at home that he will continue to use.  He notes he did not use this earlier today before coming in.  Will treat with steroid burst and Z-Pak to cover suspected COPD exacerbation. Less concern for CAP given lack of fever.  Encouraged follow-up if no gradual improvement with treatment or with any worsening symptoms.  Final Clinical Impressions(s) / UC Diagnoses   Final diagnoses:  COPD exacerbation (HCC)     Discharge Instructions       Do not take colchicine while taking azithromycin .      ED Prescriptions     Medication Sig Dispense Auth. Provider   predniSONE  (DELTASONE ) 20 MG tablet Take 2 tablets (40 mg total) by mouth daily with breakfast for 5 days. 10 tablet Jami Mcclintock F, PA-C   azithromycin  (ZITHROMAX ) 250 MG tablet Take 1 tablet (250 mg total) by mouth daily. Take first 2 tablets together, then 1 every day until finished. 6 tablet Vernestine Gondola, PA-C      PDMP not reviewed this encounter.   Vernestine Gondola, PA-C 11/02/23 1325

## 2023-12-04 ENCOUNTER — Ambulatory Visit: Attending: Cardiology | Admitting: Cardiology

## 2023-12-04 NOTE — Progress Notes (Deleted)
 Cardiology Office Note:    Date:  12/04/2023   ID:  Kurt [name], DOB April 06, 1961, MRN [id number]  PCP:  Charle Congo, MD  Cardiologist:  None  Electrophysiologist:  None   Referring MD: Charle Congo, MD   No chief complaint on file. ***  History of Present Illness:    Kurt [name] is a 63 y.o. male with a hx of with a hx of with a hx of pulmonary embolism, cor pulmonale, morbid obesity, chronic hypoxic respiratory failure, OSA who is referred by Dr. Ellamae Gunnels for evaluation of cor pulmonale.  He was admitted with submassive PE on 12/2022.  Echocardiogram 12/24/2022 showed EF 70 to 75%, grade 1 diastolic dysfunction, septal flattening in systole consistent with RV pressure overload, mild RV dysfunction, severe RV enlargement, severely elevated pulmonary pressures (RVSP 69 mmHg), moderate right atrial enlargement, mild left atrial enlargement, RAP 15.   Past Medical History:  Diagnosis Date   Anxiety    Arthritis    "hands, knees, joints, lower back" (06/20/2017)   Borderline diabetes    Chronic bronchitis (HCC)    COPD (chronic obstructive pulmonary disease) (HCC)    dx'd 06/20/2017   Depression    GERD (gastroesophageal reflux disease)    Gout    Hypertension    OSA on CPAP     Past Surgical History:  Procedure Laterality Date   NO PAST SURGERIES      Current Medications: No outpatient medications have been marked as taking for the 12/04/23 encounter (Appointment) with Kurt Hamburg, MD.     Allergies:   Penicillins   Social History   Socioeconomic History   Marital status: Single    Spouse name: Not on file   Number of children: Not on file   Years of education: Not on file   Highest education level: Not on file  Occupational History   Not on file  Tobacco Use   Smoking status: Former    Current packs/day: 0.00    Average packs/day: 1 pack/day for 30.0 years (30.0 ttl pk-yrs)    Types: Cigarettes    Start date: 6    Quit date: 2008    Years  since quitting: 17.4   Smokeless tobacco: Never  Vaping Use   Vaping status: Never Used  Substance and Sexual Activity   Alcohol  use: No    Comment: 06/20/2017 "nothing since mid 2016"   Drug use: No    Comment: 06/20/2017 "nothing since mid 2000s"   Sexual activity: Not Currently  Other Topics Concern   Not on file  Social History Narrative   Not on file   Social Drivers of Health   Financial Resource Strain: Not on file  Food Insecurity: No Food Insecurity (12/24/2022)   Hunger Vital Sign    Worried About Running Out of Food in the Last Year: Never true    Ran Out of Food in the Last Year: Never true  Transportation Needs: Unmet Transportation Needs (12/24/2022)   PRAPARE - Administrator, Civil Service (Medical): Yes    Lack of Transportation (Non-Medical): No  Physical Activity: Not on file  Stress: Not on file  Social Connections: Not on file     Family History: The patient's ***family history includes Hypertension in his mother.  ROS:   Please see the history of present illness.    *** All other systems reviewed and are negative.  EKGs/Labs/Other Studies Reviewed:    The following studies were reviewed today: ***  EKG:  EKG is *** ordered today.  The ekg ordered today demonstrates ***  Recent Labs: 12/23/2022: B Natriuretic Peptide 417.8 12/28/2022: Magnesium 2.2 01/02/2023: ALT 37; BUN 20; Creat 0.97; Hemoglobin 14.6; Platelets 236; Potassium 4.2; Sodium 136  Recent Lipid Panel    Component Value Date/Time   CHOL 134 10/01/2022 1600   TRIG 95 10/01/2022 1600   HDL 49 10/01/2022 1600   CHOLHDL 2.7 10/01/2022 1600   VLDL 17 08/09/2009 2009   LDLCALC 67 10/01/2022 1600    Physical Exam:    VS:  There were no vitals taken for this visit.    Wt Readings from Last 3 Encounters:  11/02/23 (!) 306 lb (138.8 kg)  05/06/23 (!) 306 lb (138.8 kg)  01/17/23 286 lb 9.6 oz (130 kg)     GEN: *** Well nourished, well developed in no acute  distress HEENT: Normal NECK: No JVD; No carotid bruits LYMPHATICS: No lymphadenopathy CARDIAC: ***RRR, no murmurs, rubs, gallops RESPIRATORY:  Clear to auscultation without rales, wheezing or rhonchi  ABDOMEN: Soft, non-tender, non-distended MUSCULOSKELETAL:  No edema; No deformity  SKIN: Warm and dry NEUROLOGIC:  Alert and oriented x 3 PSYCHIATRIC:  Normal affect   ASSESSMENT:    No diagnosis found. PLAN:    Cor pulmonale: In setting of submassive PE 12/2022.  Echocardiogram 12/24/2022 showed EF 70 to 75%, grade 1 diastolic dysfunction, septal flattening in systole consistent with RV pressure overload, mild RV dysfunction, severe RV enlargement, severely elevated pulmonary pressures (RVSP 69 mmHg), moderate right atrial enlargement, mild left atrial enlargement, RAP 15. -Update echocardiogram to monitor for improvement in RV function -Continue Lasix  40 mg daily.  Check BMET, magnesium***   PE: Admitted with submassive PE 12/2022.  Continue Eliquis    Chronic hypoxic respiratory failure: Follows with pulmonology.  On home oxygen   Hypertension: On losartan  50 mg daily   Morbid obesity: There is no height or weight on file to calculate BMI.   RTC in ***   Medication Adjustments/Labs and Tests Ordered: Current medicines are reviewed at length with the patient today.  Concerns regarding medicines are outlined above.  No orders of the defined types were placed in this encounter.  No orders of the defined types were placed in this encounter.   There are no Patient Instructions on file for this visit.   Signed, Kurt Hamburg, MD  12/04/2023 5:21 AM    [facility]

## 2023-12-05 ENCOUNTER — Encounter: Payer: Self-pay | Admitting: Cardiology

## 2024-02-01 ENCOUNTER — Ambulatory Visit

## 2024-02-07 ENCOUNTER — Other Ambulatory Visit: Payer: Self-pay

## 2024-02-07 ENCOUNTER — Emergency Department (HOSPITAL_COMMUNITY)
Admission: EM | Admit: 2024-02-07 | Discharge: 2024-02-07 | Disposition: A | Discharge status: Home / Self Care | Attending: Emergency Medicine | Admitting: Emergency Medicine

## 2024-02-07 ENCOUNTER — Encounter (HOSPITAL_COMMUNITY): Payer: Self-pay | Admitting: Emergency Medicine

## 2024-02-07 DIAGNOSIS — Z7901 Long term (current) use of anticoagulants: Secondary | ICD-10-CM | POA: Diagnosis not present

## 2024-02-07 DIAGNOSIS — M79651 Pain in right thigh: Secondary | ICD-10-CM | POA: Diagnosis present

## 2024-02-07 LAB — URINALYSIS, ROUTINE W REFLEX MICROSCOPIC
Bilirubin Urine: NEGATIVE
Glucose, UA: NEGATIVE mg/dL
Hgb urine dipstick: NEGATIVE
Ketones, ur: NEGATIVE mg/dL
Leukocytes,Ua: NEGATIVE
Nitrite: NEGATIVE
Protein, ur: NEGATIVE mg/dL
Specific Gravity, Urine: 1.011 (ref 1.005–1.030)
pH: 6 (ref 5.0–8.0)

## 2024-02-07 LAB — CBC
HCT: 46.3 % (ref 39.0–52.0)
Hemoglobin: 14.3 g/dL (ref 13.0–17.0)
MCH: 28.5 pg (ref 26.0–34.0)
MCHC: 30.9 g/dL (ref 30.0–36.0)
MCV: 92.4 fL (ref 80.0–100.0)
Platelets: 226 K/uL (ref 150–400)
RBC: 5.01 MIL/uL (ref 4.22–5.81)
RDW: 14.5 % (ref 11.5–15.5)
WBC: 10.7 K/uL — ABNORMAL HIGH (ref 4.0–10.5)
nRBC: 0 % (ref 0.0–0.2)

## 2024-02-07 LAB — BASIC METABOLIC PANEL WITH GFR
Anion gap: 12 (ref 5–15)
BUN: 10 mg/dL (ref 8–23)
CO2: 26 mmol/L (ref 22–32)
Calcium: 8.8 mg/dL — ABNORMAL LOW (ref 8.9–10.3)
Chloride: 101 mmol/L (ref 98–111)
Creatinine, Ser: 1.04 mg/dL (ref 0.61–1.24)
GFR, Estimated: >60 mL/min (ref >60)
Glucose, Bld: 123 mg/dL — ABNORMAL HIGH (ref 70–99)
Potassium: 3.7 mmol/L (ref 3.5–5.1)
Sodium: 139 mmol/L (ref 135–145)

## 2024-02-07 MED ORDER — METHOCARBAMOL 500 MG PO TABS: 1000.0000 mg | ORAL_TABLET | Freq: Three times a day (TID) | ORAL | 0 refills | Status: AC

## 2024-02-07 MED ORDER — METHOCARBAMOL 500 MG PO TABS
1000.0000 mg | ORAL_TABLET | Freq: Once | ORAL | Status: AC
  Administered 2024-02-07: 1000 mg via ORAL
  Filled 2024-02-07: qty 2

## 2024-02-07 MED ORDER — ACETAMINOPHEN 500 MG PO TABS
1000.0000 mg | ORAL_TABLET | Freq: Once | ORAL | Status: AC
  Administered 2024-02-07: 1000 mg via ORAL
  Filled 2024-02-07: qty 2

## 2024-02-07 NOTE — ED Provider Notes (Signed)
 [place] EMERG[facility] DEPARTMENT AT [facility] Provider Note   CSN: [id number] Arrival date & time: 02/07/24  8094     Patient presents with: Leg Pain   Kurt [name] is a 63 y.o. male.   Patient with pain in the right hip that started 2 weeks ago, starting medial thigh and going to lateral hip. No fall or injury. No similar symptoms in the past. No leg swelling. For the past 2 days, the pain extends to the anterior thigh. No pain while sitting, there is only pain when he gets up to walk after sitting for a period of time. No abdominal pain or pain that affects the groin.   The history is provided by the patient. No language interpreter was used.  Leg Pain      Prior to Admission medications   Medication Sig Start Date End Date Taking? Authorizing Provider  methocarbamol  (ROBAXIN ) 500 MG tablet Take 2 tablets (1,000 mg total) by mouth 3 (three) times daily. 02/07/24  Yes Azarya Oconnell, Margit, PA-C  albuterol  (PROVENTIL ) (2.5 MG/3ML) 0.083% nebulizer solution Take 3 mLs (2.5 mg total) by nebulization every 6 (six) hours as needed for wheezing or shortness of breath. 03/30/21   Sood, Vineet, MD  apixaban  (ELIQUIS ) 5 MG TABS tablet Take 2 tabs (10 mg total) twice daily through 01/02/2023 and then from 01/03/2023, start taking 1 tab (5 mg total) twice daily. 01/01/23   Hongalgi, Anand D, MD  azithromycin  (ZITHROMAX ) 250 MG tablet Take 1 tablet (250 mg total) by mouth daily. Take first 2 tablets together, then 1 every day until finished. 11/02/23   Billy Asberry FALCON, PA-C  Budeson-Glycopyrrol-Formoterol  (BREZTRI  AEROSPHERE) 160-9-4.8 MCG/ACT AERO Inhale 2 puffs into the lungs in the morning and at bedtime.    [provider]  cetirizine  (ZYRTEC ) 10 MG tablet Take 10 mg by mouth as needed. 12/30/17   [provider]  furosemide  (LASIX ) 40 MG tablet Take 40 mg by mouth daily.    [provider]  losartan  (COZAAR ) 50 MG tablet Take 50 mg by mouth daily.    [provider]  Multiple Vitamins-Minerals (MULTIVITAMIN GUMMIES ADULTS PO) Take by mouth.    [provider]  OXYGEN Inhale 4 L into the lungs daily. Anytime use.    [provider]  pantoprazole  (PROTONIX ) 40 MG tablet Take 40 mg by mouth daily before breakfast. Patient not taking: Reported on 01/17/2023    [provider]  potassium chloride (KLOR-CON) 10 MEQ tablet Take 10 mEq by mouth daily.    [provider]  tadalafil (CIALIS) 20 MG tablet Take 20 mg by mouth daily as needed for erectile dysfunction. 12/09/22   [provider]  tadalafil, PAH, (ADCIRCA) 20 MG tablet Take 20 mg by mouth daily as needed. 06/13/23   [provider]  tiZANidine (ZANAFLEX) 4 MG tablet Take 4 mg by mouth every 6 (six) hours as needed for muscle spasms.    [provider]  UNABLE TO FIND Med Name: CPAP with 3lpm o2  Adapt    [provider]  Vitamin D , Ergocalciferol , (DRISDOL) 50000 units CAPS capsule Take 50,000 Units by mouth once a week. 06/18/17   [provider]    Allergies: Penicillins    Review of Systems  Updated Vital Signs BP 121/76 (BP Location: Left Arm)   Pulse 98   Temp 97.9 F (36.6 C) (Oral)   Resp 19   Ht 5' 6 (1.676 m)   Wt (!) 140.6  kg   SpO2 100%   BMI 50.04 kg/m   Physical Exam Constitutional:      Appearance: He is well-developed. He is obese.  Pulmonary:     Effort: Pulmonary effort is normal.  Abdominal:     Tenderness: There is no abdominal tenderness.  Musculoskeletal:        General: Normal range of motion.     Cervical back: Normal range of motion.     Comments: Right leg is nontender to palpation. Distal pulses present. No warmth to touch. No swelling. Full strength against resistance in full range. No midline back pain.   Skin:    General: Skin is warm and dry.     Findings: No erythema.  Neurological:     Mental Status: He is alert and oriented to person, place, and time.      Sensory: No sensory deficit.     (all labs ordered are listed, but only abnormal results are displayed) Labs Reviewed  CBC - Abnormal; Notable for the following components:      Result Value   WBC 10.7 (*)    All other components within normal limits  BASIC METABOLIC PANEL WITH GFR - Abnormal; Notable for the following components:   Glucose, Bld 123 (*)    Calcium 8.8 (*)    All other components within normal limits  URINALYSIS, ROUTINE W REFLEX MICROSCOPIC   Results for orders placed or performed during the hospital encounter of 02/07/24  Urinalysis, Routine w reflex microscopic -Urine, Clean Catch   Collection Time: 02/07/24  7:47 PM  Result Value Ref Range   Color, Urine YELLOW YELLOW   APPearance CLEAR CLEAR   Specific Gravity, Urine 1.011 1.005 - 1.030   pH 6.0 5.0 - 8.0   Glucose, UA NEGATIVE NEGATIVE mg/dL   Hgb urine dipstick NEGATIVE NEGATIVE   Bilirubin Urine NEGATIVE NEGATIVE   Ketones, ur NEGATIVE NEGATIVE mg/dL   Protein, ur NEGATIVE NEGATIVE mg/dL   Nitrite NEGATIVE NEGATIVE   Leukocytes,Ua NEGATIVE NEGATIVE  CBC   Collection Time: 02/07/24  7:47 PM  Result Value Ref Range   WBC 10.7 (H) 4.0 - 10.5 K/uL   RBC 5.01 4.22 - 5.81 MIL/uL   Hemoglobin 14.3 13.0 - 17.0 g/dL   HCT 53.6 60.9 - 47.9 %   MCV 92.4 80.0 - 100.0 fL   MCH 28.5 26.0 - 34.0 pg   MCHC 30.9 30.0 - 36.0 g/dL   RDW 85.4 88.4 - 84.4 %   Platelets 226 150 - 400 K/uL   nRBC 0.0 0.0 - 0.2 %  Basic metabolic panel   Collection Time: 02/07/24  7:47 PM  Result Value Ref Range   Sodium 139 135 - 145 mmol/L   Potassium 3.7 3.5 - 5.1 mmol/L   Chloride 101 98 - 111 mmol/L   CO2 26 22 - 32 mmol/L   Glucose, Bld 123 (H) 70 - 99 mg/dL   BUN 10 8 - 23 mg/dL   Creatinine, Ser 8.95 0.61 - 1.24 mg/dL   Calcium 8.8 (L) 8.9 - 10.3 mg/dL   GFR, Estimated >39 >39 mL/min   Anion gap 12 5 - 15     EKG: None  Radiology: No results found.   Procedures   Medications Ordered in the ED   methocarbamol  (ROBAXIN ) tablet 1,000 mg (has no administration in time range)  acetaminophen  (TYLENOL ) tablet 1,000 mg (has no administration in time range)    Clinical Course as of 02/07/24 2137  Sat Feb 07, 2024  2123 Patient to ED with pain in the right hip that is weight bearing dependent. No pain at rest. No evidence infection. History of DVT in left leg and PE, compliant on anticoagulation, and current symptoms not felt consistent with DVT.  [SU]  2126 Will treat with Robaxin , Tylenol  and PCP follow up. Patient and SO comfortable with plan of care. Return precautions discussed.  [SU]    Clinical Course User Index [SU] Odell Balls, PA-C                                 Medical Decision Making       Final diagnoses:  Musculoskeletal pain of right thigh    ED Discharge Orders          Ordered    methocarbamol  (ROBAXIN ) 500 MG tablet  3 times daily        02/07/24 2128               Odell Balls, PA-C 02/07/24 2137    Franklyn Sid SAILOR, MD 02/07/24 2242

## 2024-02-07 NOTE — ED Triage Notes (Signed)
 Pt c/o pain to right hip that radiates to lower right back with shooting pain down right leg to mid thigh x 2 weeks, worsening that past 2 days

## 2024-02-07 NOTE — Discharge Instructions (Addendum)
 Follow up with Dr. Avbuere for recheck in 3-4 days. Take Robaxin  3 times daily and Tylenol  1000 mg 4 times daily (maximum dose).   Return to the ED with any leg swelling, redness, severe pain or new concern.
